# Patient Record
Sex: Male | Born: 1955 | Race: White | Hispanic: No | Marital: Married | Smoking: Former smoker
Health system: Southern US, Community
[De-identification: ages and names within clinical notes are randomized; demographics above are authoritative.]

## PROBLEM LIST (undated history)

## (undated) DIAGNOSIS — J449 Chronic obstructive pulmonary disease, unspecified: Secondary | ICD-10-CM

## (undated) DIAGNOSIS — C679 Malignant neoplasm of bladder, unspecified: Secondary | ICD-10-CM

## (undated) HISTORY — PX: RENAL ARTERY STENT: SHX2321

---

## 2016-09-16 ENCOUNTER — Emergency Department (HOSPITAL_BASED_OUTPATIENT_CLINIC_OR_DEPARTMENT_OTHER): Payer: Managed Care, Other (non HMO)

## 2016-09-16 ENCOUNTER — Encounter (HOSPITAL_BASED_OUTPATIENT_CLINIC_OR_DEPARTMENT_OTHER): Payer: Self-pay

## 2016-09-16 ENCOUNTER — Emergency Department (HOSPITAL_BASED_OUTPATIENT_CLINIC_OR_DEPARTMENT_OTHER)
Admission: EM | Admit: 2016-09-16 | Discharge: 2016-09-17 | Disposition: A | Discharge status: Home / Self Care | Payer: Managed Care, Other (non HMO) | Attending: Emergency Medicine | Admitting: Emergency Medicine

## 2016-09-16 DIAGNOSIS — T83091A Other mechanical complication of indwelling urethral catheter, initial encounter: Secondary | ICD-10-CM | POA: Insufficient documentation

## 2016-09-16 DIAGNOSIS — Y69 Unspecified misadventure during surgical and medical care: Secondary | ICD-10-CM | POA: Diagnosis not present

## 2016-09-16 DIAGNOSIS — J449 Chronic obstructive pulmonary disease, unspecified: Secondary | ICD-10-CM | POA: Insufficient documentation

## 2016-09-16 DIAGNOSIS — Z87891 Personal history of nicotine dependence: Secondary | ICD-10-CM | POA: Insufficient documentation

## 2016-09-16 HISTORY — DX: Malignant neoplasm of bladder, unspecified: C67.9

## 2016-09-16 HISTORY — DX: Chronic obstructive pulmonary disease, unspecified: J44.9

## 2016-09-16 MED ORDER — HYDROMORPHONE HCL 1 MG/ML IJ SOLN
1.0000 mg | Freq: Once | INTRAMUSCULAR | Status: AC
  Administered 2016-09-16: 1 mg via INTRAVENOUS
  Filled 2016-09-16: qty 1

## 2016-09-16 MED ORDER — ONDANSETRON HCL 4 MG/2ML IJ SOLN
4.0000 mg | Freq: Once | INTRAMUSCULAR | Status: AC
  Administered 2016-09-16: 4 mg via INTRAVENOUS
  Filled 2016-09-16: qty 2

## 2016-09-16 NOTE — ED Triage Notes (Signed)
Pt has foley catheter placed post bladder and kidney surgery for cancer, had stent placed on right kidney yesterday and about an hour ago, he started having severe right flank pain and his catheter stopped draining

## 2016-09-17 NOTE — ED Notes (Signed)
Foley cath is draining bloody output and patient stated that he felt better.

## 2016-09-17 NOTE — ED Provider Notes (Signed)
[facility] DEPT [facility] Provider Note: Georgena Spurling, MD, FACEP  CSN: [id number] MRN: [id number] ARRIVAL: 09/16/16 at [place]: [place]  Catheter blocked   HISTORY OF PRESENT ILLNESS  09/17/16 12:47 AM Corey Dixon is a 61 y.o. male who had cystoscopy for bladder cancer with resection of the tumor, right ureteral stent placement and kidney biopsy on the 21st. He has an indwelling Foley in place. About 9 PM yesterday evening his Foley catheter apparently became blocked. It was no longer draining urine and he developed severe pain in his right kidney. The pain was not relieved by the oxycodone he was prescribed. He was given Dilaudid in this emergency department with improvement in the pain and he is subsequently began draining urine again. His severe pain is relieved and he states he is ready to go without further diagnostic studies. He still has some mild pain in his right kidney which has been present since surgery. His urine is notably ordering due to taking Pyridium. He has an appointment later this morning to have his Foley catheter removed.   Past Medical History:  Diagnosis Date  . Bladder cancer ([place])   . COPD (chronic obstructive pulmonary disease) ([place])     Past Surgical History:  Procedure Laterality Date  . RENAL ARTERY STENT  2009, 09/15/2016    No family history on file.  Social History  Substance Use Topics  . Smoking status: Former Smoker    Types: E-cigarettes  . Smokeless tobacco: Never Used  . Alcohol use Yes    Prior to Admission medications   Not on File    Allergies Penicillins   REVIEW OF SYSTEMS  Negative except as noted here or in the History of Present Illness.   PHYSICAL EXAMINATION  Initial Vital Signs Blood pressure (!) 181/115, pulse 82, temperature 99.8 F (37.7 C), temperature source Oral, resp. rate 20, height 6' (1.829 m), weight 83.9 kg (185 lb), SpO2 94 %.  Examination General: Well-developed,  well-nourished male in no acute distress; appearance consistent with age of record HENT: normocephalic; atraumatic Eyes: pupils equal, round and reactive to light; extraocular muscles intact Neck: supple Heart: regular rate and rhythm Lungs: clear to auscultation bilaterally Abdomen: soft; nondistended; nontender; bowel sounds present GU: Mild right CVA tenderness; full catheter draining orange urine Extremities: No deformity; full range of motion; pulses normal Neurologic: Awake, alert and oriented; motor function intact in all extremities and symmetric; no facial droop Skin: Warm and dry Psychiatric: Normal mood and affect   RESULTS  Summary of this visit's results, reviewed by myself:   EKG Interpretation  Date/Time:    Ventricular Rate:    PR Interval:    QRS Duration:   QT Interval:    QTC Calculation:   R Axis:     Text Interpretation:        Laboratory Studies: No results found for this or any previous visit (from the past 24 hour(s)). Imaging Studies: No results found.  ED COURSE  Nursing notes and initial vitals signs, including pulse oximetry, reviewed.  Vitals:   09/16/16 2326  BP: (!) 181/115  Pulse: 82  Resp: 20  Temp: 99.8 F (37.7 C)  TempSrc: Oral  SpO2: 94%  Weight: 83.9 kg (185 lb)  Height: 6' (1.829 m)    PROCEDURES    ED DIAGNOSES     ICD-10-CM   1. Obstructed Foley catheter, initial encounter ([place]) T83.091A        Shanon Rosser, MD 09/17/16  0100  

## 2016-09-17 NOTE — ED Notes (Signed)
ED Provider at bedside. 

## 2017-07-18 ENCOUNTER — Encounter (HOSPITAL_BASED_OUTPATIENT_CLINIC_OR_DEPARTMENT_OTHER): Payer: Self-pay | Admitting: Emergency Medicine

## 2017-07-18 ENCOUNTER — Emergency Department (HOSPITAL_BASED_OUTPATIENT_CLINIC_OR_DEPARTMENT_OTHER): Payer: Managed Care, Other (non HMO)

## 2017-07-18 ENCOUNTER — Emergency Department (HOSPITAL_BASED_OUTPATIENT_CLINIC_OR_DEPARTMENT_OTHER)
Admission: EM | Admit: 2017-07-18 | Discharge: 2017-07-18 | Disposition: A | Discharge status: Home / Self Care | Payer: Managed Care, Other (non HMO) | Attending: Emergency Medicine | Admitting: Emergency Medicine

## 2017-07-18 ENCOUNTER — Other Ambulatory Visit: Payer: Self-pay

## 2017-07-18 DIAGNOSIS — Z8551 Personal history of malignant neoplasm of bladder: Secondary | ICD-10-CM | POA: Insufficient documentation

## 2017-07-18 DIAGNOSIS — Z87891 Personal history of nicotine dependence: Secondary | ICD-10-CM | POA: Insufficient documentation

## 2017-07-18 DIAGNOSIS — R05 Cough: Secondary | ICD-10-CM | POA: Diagnosis present

## 2017-07-18 DIAGNOSIS — J209 Acute bronchitis, unspecified: Secondary | ICD-10-CM

## 2017-07-18 DIAGNOSIS — J449 Chronic obstructive pulmonary disease, unspecified: Secondary | ICD-10-CM | POA: Insufficient documentation

## 2017-07-18 MED ORDER — PREDNISONE 20 MG PO TABS: ORAL_TABLET | ORAL | 0 refills | Status: DC

## 2017-07-18 MED ORDER — IPRATROPIUM-ALBUTEROL 0.5-2.5 (3) MG/3ML IN SOLN
3.0000 mL | RESPIRATORY_TRACT | Status: DC
  Administered 2017-07-18: 3 mL via RESPIRATORY_TRACT
  Filled 2017-07-18: qty 3

## 2017-07-18 MED ORDER — PREDNISONE 10 MG PO TABS
60.0000 mg | ORAL_TABLET | Freq: Once | ORAL | Status: AC
  Administered 2017-07-18: 22:00:00 60 mg via ORAL
  Filled 2017-07-18: qty 1

## 2017-07-18 NOTE — Progress Notes (Signed)
RT walked patient around the Department. Patients SP02 maintained at 94% to 95% on Room air. Patient states he's not short of breath when he walks.

## 2017-07-18 NOTE — ED Provider Notes (Signed)
Emergency Department Provider Note   I have reviewed the triage vital signs and the nursing notes.   HISTORY  Chief Complaint Cough   HPI Corey Dixon is a 62 y.o. male with a history of kidney cancer and COPD who presents the emergency department today with a cough and shortness of breath.  Patient states that he recently started chemotherapy last week and was camping in [place] over the weekend and noticed had a gradual onset of worsening shortness of breath.  Especially with exertion.  States he never really had anything like this before.  No chest pain or back pain.  No other associated symptoms.  No fevers.  Does have a cough but it is clear sputum.  No lower extremity swelling or leg pain. No other associated or modifying symptoms.    Past Medical History:  Diagnosis Date  . Bladder cancer ([place])   . COPD (chronic obstructive pulmonary disease) ([facility])     There are no active problems to display for this patient.   Past Surgical History:  Procedure Laterality Date  . RENAL ARTERY STENT  2009, 09/15/2016    Current Outpatient Rx  . Order #: [id number] Class: Historical Med  . Order #: [id number] Class: Historical Med  . Order #: [id number] Class: Historical Med  . Order #: [id number] Class: Historical Med  . Order #: [id number] Class: Historical Med  . Order #: [id number] Class: Historical Med  . Order #: [id number] Class: Historical Med  . Order #: [id number] Class: Historical Med  . Order #: [id number] Class: Historical Med  . Order #: [id number] Class: Print  . Order #: [id number] Class: Historical Med    Allergies Penicillins  History reviewed. No pertinent family history.  Social History Social History   Tobacco Use  . Smoking status: Former Smoker    Types: E-cigarettes  . Smokeless tobacco: Never Used  Substance Use Topics  . Alcohol use: Yes  . Drug use: Not on file    Review of Systems  All other systems negative except as documented in the HPI. All pertinent  positives and negatives as reviewed in the HPI. ____________________________________________   PHYSICAL EXAM:  VITAL SIGNS: ED Triage Vitals  Enc Vitals Group     BP 07/18/17 1940 136/85     Pulse Rate 07/18/17 1940 96     Resp 07/18/17 1940 20     Temp 07/18/17 1940 98 F (36.7 C)     Temp Source 07/18/17 1940 Oral     SpO2 07/18/17 1940 95 %     Weight 07/18/17 1936 185 lb (83.9 kg)     Height 07/18/17 1936 6' (1.829 m)    Constitutional: Alert and oriented. Well appearing and in no acute distress. Eyes: Conjunctivae are normal. PERRL. EOMI. Head: Atraumatic. Nose: No congestion/rhinnorhea. Mouth/Throat: Mucous membranes are moist.  Oropharynx non-erythematous. Neck: No stridor.  No meningeal signs.   Cardiovascular: Normal rate, regular rhythm. Good peripheral circulation. Grossly normal heart sounds.   Respiratory: Normal respiratory effort.  No retractions. Lungs diminished with mild wheezing. Gastrointestinal: Soft and nontender. No distention.  Musculoskeletal: No lower extremity tenderness nor edema. No gross deformities of extremities. Neurologic:  Normal speech and language. No gross focal neurologic deficits are appreciated.  Skin:  Skin is warm, dry and intact. No rash noted.   ____________________________________________   LABS (all labs ordered are listed, but only abnormal results are displayed)  Labs Reviewed - No data to display ____________________________________________  EKG   EKG Interpretation  Date/Time:  Sunday July 18 2017 19:44:02 EDT  Ventricular Rate:  94 PR Interval:    QRS Duration: 82 QT Interval:  340 QTC Calculation: 426 R Axis:   74 Text Interpretation:  Sinus rhythm Anteroseptal infarct, age indeterminate No old tracing to compare Confirmed by Merrily Pew [id number]) on 07/18/2017 8:37:21 PM      ____________________________________________  RADIOLOGY  Dg Chest 2 View  Result Date: 07/18/2017 CLINICAL DATA:  Shortness of  breath for the past 3 days. Ex-smoker. Being treated for kidney cancer. COPD. EXAM: CHEST - 2 VIEW COMPARISON:  None. FINDINGS: Normal sized heart. Hyperexpanded lungs. Increased density at the anterior, medial left lung base with obscuration of the left heart were, compatible with a mildly prominent epicardial fat pad. Mild peribronchial thickening. Small caliber right jugular porta catheter with its tip at the superior cavoatrial junction. Unremarkable bones. IMPRESSION: No acute abnormality.  Mild changes of COPD and chronic bronchitis. Electronically Signed   By: Claudie Revering M.D.   On: 07/18/2017 20:33    ____________________________________________   INITIAL IMPRESSION / ASSESSMENT AND PLAN / ED COURSE  Possibly bronchitis with a diminished breath sounds and history of the same.  Will give breathing treatment check x-ray.  However this does not help him entirely will need evaluation for cardiac and PE causes.  Patient with total resolution of symptoms after breathing treatment.  We will start him on steroids and have him do his inhaler at home as well.  Once again very very low suspicion for pulmonary embolus or cardiac cause however I did discuss with him that if his symptoms worsen or had any new symptoms with it he would need to return here for evaluation.  Otherwise will follow-up with his primary doctor.     Pertinent labs & imaging results that were available during my care of the patient were reviewed by me and considered in my medical decision making (see chart for details).  ____________________________________________  FINAL CLINICAL IMPRESSION(S) / ED DIAGNOSES  Final diagnoses:  Acute bronchitis, unspecified organism     MEDICATIONS GIVEN DURING THIS VISIT:  Medications  ipratropium-albuterol (DUONEB) 0.5-2.5 (3) MG/3ML nebulizer solution 3 mL (3 mLs Nebulization Given 07/18/17 2022)  predniSONE (DELTASONE) tablet 60 mg (60 mg Oral Given 07/18/17 2157)     NEW  OUTPATIENT MEDICATIONS STARTED DURING THIS VISIT:  Discharge Medication List as of 07/18/2017  9:51 PM    START taking these medications   Details  predniSONE (DELTASONE) 20 MG tablet 2 tabs po daily x 4 days, Print        Note:  This note was prepared with assistance of Dragon voice recognition software. Occasional wrong-word or sound-a-like substitutions may have occurred due to the inherent limitations of voice recognition software.   Merrily Pew, MD 07/18/17 2230

## 2017-07-18 NOTE — ED Triage Notes (Signed)
Patient states that he has been SOB for the last several days - [place]. The patient reports his last chemo treatment was on Tues.

## 2020-02-18 ENCOUNTER — Emergency Department (HOSPITAL_BASED_OUTPATIENT_CLINIC_OR_DEPARTMENT_OTHER): Payer: Medicare Other

## 2020-02-18 ENCOUNTER — Encounter (HOSPITAL_BASED_OUTPATIENT_CLINIC_OR_DEPARTMENT_OTHER): Payer: Self-pay | Admitting: Emergency Medicine

## 2020-02-18 ENCOUNTER — Other Ambulatory Visit: Payer: Self-pay

## 2020-02-18 ENCOUNTER — Inpatient Hospital Stay (HOSPITAL_BASED_OUTPATIENT_CLINIC_OR_DEPARTMENT_OTHER)
Admission: EM | Admit: 2020-02-18 | Discharge: 2020-02-25 | DRG: 982 | Disposition: A | Discharge status: Home / Self Care | Payer: Medicare Other | Attending: Internal Medicine | Admitting: Internal Medicine

## 2020-02-18 DIAGNOSIS — Z9889 Other specified postprocedural states: Secondary | ICD-10-CM

## 2020-02-18 DIAGNOSIS — K219 Gastro-esophageal reflux disease without esophagitis: Secondary | ICD-10-CM | POA: Diagnosis present

## 2020-02-18 DIAGNOSIS — I313 Pericardial effusion (noninflammatory): Secondary | ICD-10-CM | POA: Diagnosis present

## 2020-02-18 DIAGNOSIS — R5383 Other fatigue: Secondary | ICD-10-CM | POA: Diagnosis present

## 2020-02-18 DIAGNOSIS — Z87891 Personal history of nicotine dependence: Secondary | ICD-10-CM

## 2020-02-18 DIAGNOSIS — Z7952 Long term (current) use of systemic steroids: Secondary | ICD-10-CM

## 2020-02-18 DIAGNOSIS — Z23 Encounter for immunization: Secondary | ICD-10-CM

## 2020-02-18 DIAGNOSIS — R918 Other nonspecific abnormal finding of lung field: Secondary | ICD-10-CM

## 2020-02-18 DIAGNOSIS — Z20822 Contact with and (suspected) exposure to covid-19: Secondary | ICD-10-CM | POA: Diagnosis present

## 2020-02-18 DIAGNOSIS — C7802 Secondary malignant neoplasm of left lung: Secondary | ICD-10-CM | POA: Diagnosis present

## 2020-02-18 DIAGNOSIS — D529 Folate deficiency anemia, unspecified: Secondary | ICD-10-CM | POA: Diagnosis present

## 2020-02-18 DIAGNOSIS — R911 Solitary pulmonary nodule: Secondary | ICD-10-CM

## 2020-02-18 DIAGNOSIS — C3412 Malignant neoplasm of upper lobe, left bronchus or lung: Secondary | ICD-10-CM | POA: Diagnosis not present

## 2020-02-18 DIAGNOSIS — I160 Hypertensive urgency: Secondary | ICD-10-CM | POA: Diagnosis present

## 2020-02-18 DIAGNOSIS — C787 Secondary malignant neoplasm of liver and intrahepatic bile duct: Secondary | ICD-10-CM | POA: Diagnosis present

## 2020-02-18 DIAGNOSIS — Z8551 Personal history of malignant neoplasm of bladder: Secondary | ICD-10-CM

## 2020-02-18 DIAGNOSIS — Z85528 Personal history of other malignant neoplasm of kidney: Secondary | ICD-10-CM

## 2020-02-18 DIAGNOSIS — J449 Chronic obstructive pulmonary disease, unspecified: Secondary | ICD-10-CM

## 2020-02-18 DIAGNOSIS — N179 Acute kidney failure, unspecified: Secondary | ICD-10-CM | POA: Diagnosis present

## 2020-02-18 DIAGNOSIS — Z79899 Other long term (current) drug therapy: Secondary | ICD-10-CM

## 2020-02-18 DIAGNOSIS — K5730 Diverticulosis of large intestine without perforation or abscess without bleeding: Secondary | ICD-10-CM | POA: Diagnosis present

## 2020-02-18 DIAGNOSIS — J432 Centrilobular emphysema: Secondary | ICD-10-CM | POA: Diagnosis present

## 2020-02-18 DIAGNOSIS — Z9221 Personal history of antineoplastic chemotherapy: Secondary | ICD-10-CM

## 2020-02-18 DIAGNOSIS — U071 COVID-19: Secondary | ICD-10-CM

## 2020-02-18 DIAGNOSIS — J189 Pneumonia, unspecified organism: Secondary | ICD-10-CM

## 2020-02-18 DIAGNOSIS — U099 Post covid-19 condition, unspecified: Secondary | ICD-10-CM | POA: Diagnosis present

## 2020-02-18 DIAGNOSIS — Z7951 Long term (current) use of inhaled steroids: Secondary | ICD-10-CM

## 2020-02-18 DIAGNOSIS — J188 Other pneumonia, unspecified organism: Secondary | ICD-10-CM | POA: Diagnosis not present

## 2020-02-18 DIAGNOSIS — R590 Localized enlarged lymph nodes: Secondary | ICD-10-CM | POA: Diagnosis present

## 2020-02-18 DIAGNOSIS — I10 Essential (primary) hypertension: Secondary | ICD-10-CM | POA: Diagnosis present

## 2020-02-18 DIAGNOSIS — E871 Hypo-osmolality and hyponatremia: Secondary | ICD-10-CM | POA: Diagnosis not present

## 2020-02-18 DIAGNOSIS — E860 Dehydration: Secondary | ICD-10-CM | POA: Diagnosis present

## 2020-02-18 DIAGNOSIS — G4486 Cervicogenic headache: Secondary | ICD-10-CM | POA: Diagnosis present

## 2020-02-18 DIAGNOSIS — M79601 Pain in right arm: Secondary | ICD-10-CM | POA: Diagnosis present

## 2020-02-18 DIAGNOSIS — Z905 Acquired absence of kidney: Secondary | ICD-10-CM

## 2020-02-18 DIAGNOSIS — R0600 Dyspnea, unspecified: Secondary | ICD-10-CM

## 2020-02-18 DIAGNOSIS — J984 Other disorders of lung: Secondary | ICD-10-CM

## 2020-02-18 DIAGNOSIS — Z880 Allergy status to penicillin: Secondary | ICD-10-CM

## 2020-02-18 LAB — CBC WITH DIFFERENTIAL/PLATELET
Abs Immature Granulocytes: 0.05 10*3/uL (ref 0.00–0.07)
Basophils Absolute: 0 10*3/uL (ref 0.0–0.1)
Basophils Relative: 0 %
Eosinophils Absolute: 0.1 10*3/uL (ref 0.0–0.5)
Eosinophils Relative: 1 %
HCT: 35.4 % — ABNORMAL LOW (ref 39.0–52.0)
Hemoglobin: 12 g/dL — ABNORMAL LOW (ref 13.0–17.0)
Immature Granulocytes: 1 %
Lymphocytes Relative: 8 %
Lymphs Abs: 0.8 10*3/uL (ref 0.7–4.0)
MCH: 28.5 pg (ref 26.0–34.0)
MCHC: 33.9 g/dL (ref 30.0–36.0)
MCV: 84.1 fL (ref 80.0–100.0)
Monocytes Absolute: 0.8 10*3/uL (ref 0.1–1.0)
Monocytes Relative: 8 %
Neutro Abs: 7.8 10*3/uL — ABNORMAL HIGH (ref 1.7–7.7)
Neutrophils Relative %: 82 %
Platelets: 304 10*3/uL (ref 150–400)
RBC: 4.21 MIL/uL — ABNORMAL LOW (ref 4.22–5.81)
RDW: 12.3 % (ref 11.5–15.5)
WBC: 9.5 10*3/uL (ref 4.0–10.5)
nRBC: 0 % (ref 0.0–0.2)

## 2020-02-18 LAB — COMPREHENSIVE METABOLIC PANEL
ALT: 20 U/L (ref 0–44)
AST: 17 U/L (ref 15–41)
Albumin: 3.6 g/dL (ref 3.5–5.0)
Alkaline Phosphatase: 78 U/L (ref 38–126)
Anion gap: 11 (ref 5–15)
BUN: 20 mg/dL (ref 8–23)
CO2: 23 mmol/L (ref 22–32)
Calcium: 8.9 mg/dL (ref 8.9–10.3)
Chloride: 102 mmol/L (ref 98–111)
Creatinine, Ser: 2.02 mg/dL — ABNORMAL HIGH (ref 0.61–1.24)
GFR, Estimated: 36 mL/min — ABNORMAL LOW (ref >60)
Glucose, Bld: 125 mg/dL — ABNORMAL HIGH (ref 70–99)
Potassium: 4.1 mmol/L (ref 3.5–5.1)
Sodium: 136 mmol/L (ref 135–145)
Total Bilirubin: 0.2 mg/dL — ABNORMAL LOW (ref 0.3–1.2)
Total Protein: 7.2 g/dL (ref 6.5–8.1)

## 2020-02-18 LAB — LACTIC ACID, PLASMA: Lactic Acid, Venous: 1.2 mmol/L (ref 0.5–1.9)

## 2020-02-18 LAB — TROPONIN I (HIGH SENSITIVITY): Troponin I (High Sensitivity): 3 ng/L (ref <18)

## 2020-02-18 MED ORDER — ACETAMINOPHEN 325 MG PO TABS
650.0000 mg | ORAL_TABLET | Freq: Once | ORAL | Status: AC
  Administered 2020-02-18: 650 mg via ORAL
  Filled 2020-02-18: qty 2

## 2020-02-18 MED ORDER — SODIUM CHLORIDE 0.9 % IV SOLN
1.0000 g | Freq: Once | INTRAVENOUS | Status: DC
  Filled 2020-02-18: qty 1

## 2020-02-18 MED ORDER — VANCOMYCIN HCL IN DEXTROSE 1-5 GM/200ML-% IV SOLN
1000.0000 mg | Freq: Once | INTRAVENOUS | Status: AC
  Administered 2020-02-19: 1000 mg via INTRAVENOUS
  Filled 2020-02-18: qty 200

## 2020-02-18 MED ORDER — SODIUM CHLORIDE 0.9 % IV SOLN
2.0000 g | Freq: Once | INTRAVENOUS | Status: AC
  Administered 2020-02-18: 2 g via INTRAVENOUS
  Filled 2020-02-18: qty 2

## 2020-02-18 MED ORDER — SODIUM CHLORIDE 0.9 % IV SOLN: INTRAVENOUS | Status: DC | PRN

## 2020-02-18 MED ORDER — SODIUM CHLORIDE 0.9 % IV BOLUS
1000.0000 mL | Freq: Once | INTRAVENOUS | Status: AC
  Administered 2020-02-18: 1000 mL via INTRAVENOUS

## 2020-02-18 MED ORDER — METHYLPREDNISOLONE SODIUM SUCC 125 MG IJ SOLR
125.0000 mg | Freq: Once | INTRAMUSCULAR | Status: AC
  Administered 2020-02-18: 125 mg via INTRAVENOUS
  Filled 2020-02-18: qty 2

## 2020-02-18 MED ORDER — ALBUTEROL SULFATE HFA 108 (90 BASE) MCG/ACT IN AERS
2.0000 | INHALATION_SPRAY | Freq: Once | RESPIRATORY_TRACT | Status: AC
  Administered 2020-02-18: 2 via RESPIRATORY_TRACT
  Filled 2020-02-18: qty 6.7

## 2020-02-18 NOTE — ED Provider Notes (Signed)
[facility] EMERGENCY DEPARTMENT Provider Note   CSN: [id number] Arrival date & time: 02/18/20  1955     History Chief Complaint  Patient presents with  . Chest Pain  . Shortness of Breath    Corey Dixon is a 65 y.o. male hx of COPD, bladder cancer s/p BCG here with cough, poor appetite. He was diagnosed with COVID on 1/10. He has been having shortness of breath since then. He had some chills that resolved. He has persistent cough and poor appetite. Patient states that he should be feeling better but felt worse so came for evaluation. Of note, patient has no recent travel or history of blood clots. He had previous lung mass that was stable as of 2019   The history is provided by the patient.       Past Medical History:  Diagnosis Date  . Bladder cancer ([place])   . COPD (chronic obstructive pulmonary disease) ([facility])     There are no problems to display for this patient.   Past Surgical History:  Procedure Laterality Date  . RENAL ARTERY STENT  2009, 09/15/2016       History reviewed. No pertinent family history.  Social History   Tobacco Use  . Smoking status: Former Smoker    Types: E-cigarettes  . Smokeless tobacco: Never Used  Substance Use Topics  . Alcohol use: Yes    Home Medications Prior to Admission medications   Medication Sig Start Date End Date Taking? Authorizing Provider  albuterol (PROAIR HFA) 108 (90 Base) MCG/ACT inhaler Inhale into the lungs. 11/11/15   [provider]  Brexpiprazole (REXULTI) 0.5 MG TABS every morning. 04/30/17   [provider]  Fluticasone-Salmeterol (ADVAIR DISKUS) 100-50 MCG/DOSE AEPB INHALE 1 PUFF INTO THE LUNGS TWICE DAILY 03/01/15   [provider]  omeprazole (PRILOSEC OTC) 20 MG tablet Take by mouth.    [provider]  oxybutynin (DITROPAN) 5 MG tablet Take by mouth. 01/08/17   [provider]  oxyCODONE (ROXICODONE) 5 MG immediate release tablet Take by mouth.  01/08/17   [provider]  PARoxetine (PAXIL) 30 MG tablet Take by mouth. 04/30/17   [provider]  pravastatin (PRAVACHOL) 10 MG tablet Take by mouth. 08/27/15   [provider]  predniSONE (DELTASONE) 20 MG tablet 2 tabs po daily x 4 days 07/18/17   Mesner, Corene Cornea, MD  REXULTI 0.5 MG TABS TK 1 T PO D 07/03/17   [provider]  tamsulosin (FLOMAX) 0.4 MG CAPS capsule TAKE 1 CAPSULE(0.4 MG) BY MOUTH DAILY 07/10/15   [provider]    Allergies    Penicillins  Review of Systems   Review of Systems  Respiratory: Positive for shortness of breath.   Cardiovascular: Positive for chest pain.  All other systems reviewed and are negative.   Physical Exam Updated Vital Signs BP (!) 157/89 (BP Location: Left Arm)   Pulse 87   Temp 98.3 F (36.8 C) (Oral)   Resp 20   Ht 6' (1.829 m)   Wt 83.9 kg   SpO2 96%   BMI 25.09 kg/m   Physical Exam Vitals and nursing note reviewed.  Constitutional:      Comments: Chronically ill   HENT:     Head: Normocephalic.  Eyes:     Extraocular Movements: Extraocular movements intact.     Pupils: Pupils are equal, round, and reactive to light.  Cardiovascular:     Rate and Rhythm: Normal rate and regular  rhythm.     Heart sounds: Normal heart sounds.  Pulmonary:     Comments: Wheezing R side, diminished L side  Abdominal:     General: Bowel sounds are normal.     Palpations: Abdomen is soft.  Musculoskeletal:        General: Normal range of motion.     Cervical back: Normal range of motion and neck supple.  Skin:    General: Skin is warm.     Capillary Refill: Capillary refill takes less than 2 seconds.  Neurological:     General: No focal deficit present.     Mental Status: He is alert and oriented to person, place, and time.  Psychiatric:        Mood and Affect: Mood normal.        Behavior: Behavior normal.     ED Results / Procedures / Treatments   Labs (all labs ordered are listed, but  only abnormal results are displayed) Labs Reviewed  CBC WITH DIFFERENTIAL/PLATELET - Abnormal; Notable for the following components:      Result Value   RBC 4.21 (*)    Hemoglobin 12.0 (*)    HCT 35.4 (*)    Neutro Abs 7.8 (*)    All other components within normal limits  COMPREHENSIVE METABOLIC PANEL - Abnormal; Notable for the following components:   Glucose, Bld 125 (*)    Creatinine, Ser 2.02 (*)    Total Bilirubin 0.2 (*)    GFR, Estimated 36 (*)    All other components within normal limits  CULTURE, BLOOD (ROUTINE X 2)  CULTURE, BLOOD (ROUTINE X 2)  LACTIC ACID, PLASMA  LACTIC ACID, PLASMA  TROPONIN I (HIGH SENSITIVITY)    EKG EKG Interpretation  Date/Time:  Sunday February 18 2020 20:48:39 EST Ventricular Rate:  92 PR Interval:    QRS Duration: 88 QT Interval:  336 QTC Calculation: 416 R Axis:   81 Text Interpretation: Sinus rhythm Anteroseptal infarct, age indeterminate No significant change since last tracing Confirmed by Wandra Arthurs [phone]) on 02/18/2020 8:55:17 PM   Radiology CT CHEST WO CONTRAST  Result Date: 02/18/2020 CLINICAL DATA:  Chest pain, dyspnea, COVID positive EXAM: CT CHEST WITHOUT CONTRAST TECHNIQUE: Multidetector CT imaging of the chest was performed following the standard protocol without IV contrast. COMPARISON:  None. FINDINGS: Cardiovascular: Mild coronary artery calcification. Global cardiac size within normal limits. Trace pericardial effusion. The central pulmonary arteries are of normal caliber. Mild atherosclerotic calcification within the thoracic aorta. No aortic aneurysm. Mediastinum/Nodes: Pathologic right paratracheal and aortopulmonary lymphadenopathy is present with the index lymph node seen within the right paratracheal region measuring 2.0 x 3.1 cm at axial image # 57. The esophagus is unremarkable. Thyroid unremarkable. Lungs/Pleura: Severe, apically predominant centrilobular emphysema. The irregular opacity noted within the left apex  on prior chest radiograph corresponds to a oblong cavitary mass measuring 2.1 x 2.1 x 3.7 cm in greatest dimension on axial image # 30 and coronal image # 64. The lesion demonstrates a a mildly irregular soft tissue rind and a cavitary neoplasm is difficult to exclude. Alternatively, this may represent the residua of a cavitary infectious process. No superimposed focal pulmonary infiltrate is identified. There are scattered throughout the lungs in a random distribution demonstrating a lower lung zone predominance at least 30 pulmonary nodules measuring up to 15 mm within the left upper lobe at axial image # 61. These are suspicious for pulmonary metastases in these may represent metastatic disease from an unknown primary  versus is intra pulmonary metastases secondary to a primary pulmonary malignancy. No pneumothorax or pleural effusion. The central airways are widely patent. Upper Abdomen: A surgical staple line is seen inferior to the right adrenal gland which may reflect surgical changes of a right nephrectomy, though this is not definitively identified on this examination. No acute abnormality within the upper abdomen. Musculoskeletal: No lytic or blastic bone lesions are identified. IMPRESSION: Left upper lobe opacity corresponds to a cavitary mass within the left upper lobe possibly representing a post inflammatory cavity or, less likely, a cavitary neoplasm. Innumerable pulmonary nodules distributed randomly with a lower lobe predominance most in keeping with pulmonary metastatic disease. Pathologic mediastinal adenopathy also identified. Severe centrilobular emphysema. Aortic Atherosclerosis (ICD10-I70.0). Electronically Signed   By: Fidela Salisbury MD   On: 02/18/2020 22:42   DG Chest Port 1 View  Result Date: 02/18/2020 CLINICAL DATA:  Chest pain and shortness of breath since positive COVID test from 2 weeks ago. EXAM: PORTABLE CHEST 1 VIEW COMPARISON:  07/18/2017 FINDINGS: Heart size and pulmonary  vascularity are normal. Emphysematous changes in the lungs. Scattered fibrosis. There is suggestion of a developing cavitary lesion in the left apex measuring 2.4 cm diameter. CT suggested for further evaluation. No pleural effusions. No pneumothorax. Mediastinal contours appear intact. IMPRESSION: 1. Suggestion of developing cavitary lesion in the left apex. CT suggested for further evaluation. 2. Emphysematous changes and fibrosis in the lungs. Electronically Signed   By: Lucienne Capers M.D.   On: 02/18/2020 21:15    Procedures Procedures (including critical care time)  CRITICAL CARE Performed by: Wandra Arthurs   Total critical care time: 30 minutes  Critical care time was exclusive of separately billable procedures and treating other patients.  Critical care was necessary to treat or prevent imminent or life-threatening deterioration.  Critical care was time spent personally by me on the following activities: development of treatment plan with patient and/or surrogate as well as nursing, discussions with consultants, evaluation of patient's response to treatment, examination of patient, obtaining history from patient or surrogate, ordering and performing treatments and interventions, ordering and review of laboratory studies, ordering and review of radiographic studies, pulse oximetry and re-evaluation of patient's condition.   Medications Ordered in ED Medications  vancomycin (VANCOCIN) IVPB 1000 mg/200 mL premix (has no administration in time range)  ceFEPIme (MAXIPIME) 2 g in sodium chloride 0.9 % 100 mL IVPB (has no administration in time range)  sodium chloride 0.9 % bolus 1,000 mL (0 mLs Intravenous Stopped 02/18/20 2235)  methylPREDNISolone sodium succinate (SOLU-MEDROL) 125 mg/2 mL injection 125 mg (125 mg Intravenous Given 02/18/20 2114)  albuterol (VENTOLIN HFA) 108 (90 Base) MCG/ACT inhaler 2 puff (2 puffs Inhalation Given 02/18/20 2122)    ED Course  I have reviewed the  triage vital signs and the nursing notes.  Pertinent labs & imaging results that were available during my care of the patient were reviewed by me and considered in my medical decision making (see chart for details).    MDM Rules/Calculators/A&P                         Corey Dixon is a 65 y.o. male recent COVID 2 weeks ago here with persistent cough, SOB. Has wheezing on exam and hx of COPD so consider COPD exacerbation vs pneumonia. Will get cbc, cmp, CXR.   11:18 PM CXR showed l upper lobe cavitary lesion. CBC unremarkable. CMP showed Cr 2.0, which is  baseline. CT chest showed left upper lobe cavitary mass and possible metastatic disease. He has no known TB risk factors and had multiple negative TB test in the past since he had to get BCG for his bladder cancer. Talked to Dr. Tommy Medal from [facility]. He states that it could be MRSA vs bled vs post inflammatory cavity. He may need AFB or bronchoscopy for further evaluation. Will give broad spectrum abx for pneumonia and will admit for further evaluation and r/o TB vs mass.    Final Clinical Impression(s) / ED Diagnoses Final diagnoses:  None    Rx / DC Orders ED Discharge Orders    None       Drenda Freeze, MD 02/18/20 2320

## 2020-02-18 NOTE — ED Triage Notes (Signed)
Patient presents with complaints of chest pain and shortness of breath ongoing since covid positive. States positive covid test from [facility] 2 weeks ago.

## 2020-02-18 NOTE — ED Notes (Signed)
Pt to CT

## 2020-02-19 MED ORDER — ACETAMINOPHEN 325 MG PO TABS
650.0000 mg | ORAL_TABLET | Freq: Four times a day (QID) | ORAL | Status: DC | PRN
  Administered 2020-02-19 – 2020-02-22 (×5): 650 mg via ORAL
  Filled 2020-02-19 (×4): qty 2

## 2020-02-19 MED ORDER — SODIUM CHLORIDE 0.9 % IV SOLN
2.0000 g | Freq: Two times a day (BID) | INTRAVENOUS | Status: DC
  Administered 2020-02-19 – 2020-02-20 (×3): 2 g via INTRAVENOUS
  Filled 2020-02-19 (×3): qty 2

## 2020-02-19 MED ORDER — VANCOMYCIN HCL IN DEXTROSE 1-5 GM/200ML-% IV SOLN
1000.0000 mg | INTRAVENOUS | Status: DC
  Administered 2020-02-19 – 2020-02-20 (×2): 1000 mg via INTRAVENOUS
  Filled 2020-02-19 (×2): qty 200

## 2020-02-19 NOTE — ED Notes (Signed)
[facility] at Bedside; Report Given to same.

## 2020-02-19 NOTE — Progress Notes (Signed)
Pharmacy Antibiotic Note  Corey Dixon is a 65 y.o. male admitted on 02/18/2020 with pneumonia.  Pharmacy has been consulted for vancomycin and cefepime dosing. Pt is afebrile and WBC is WNL. Scr is elevated.   Plan: Vancomycin 1gm IV Q24H Cefepime 2gm IV Q12H F/u renal fxn, C&S, clinical status and peak/trough at SS  Height: 6' (182.9 cm) Weight: 83.9 kg (184 lb 15.5 oz) IBW/kg (Calculated) : 77.6  Temp (24hrs), Avg:98.3 F (36.8 C), Min:98.3 F (36.8 C), Max:98.3 F (36.8 C)  Recent Labs  Lab 02/18/20 2125 02/18/20 2234  WBC 9.5  --   CREATININE 2.02*  --   LATICACIDVEN  --  1.2    Estimated Creatinine Clearance: 39.5 mL/min (A) (by C-G formula based on SCr of 2.02 mg/dL (H)).    Allergies  Allergen Reactions  . Penicillins     unknown    Antimicrobials this admission: Vanc 1/24>> Cefepime 1/24>>  Dose adjustments this admission: N/A  Microbiology results: Pending  Thank you for allowing pharmacy to be a part of this patient's care.  Kimara Bencomo, Rande Lawman 02/19/2020 1:21 PM

## 2020-02-19 NOTE — ED Notes (Signed)
Care Handoff/Report given to [facility], 89M RN at this time. All questions answered.

## 2020-02-20 DIAGNOSIS — J1282 Pneumonia due to coronavirus disease 2019: Secondary | ICD-10-CM | POA: Diagnosis not present

## 2020-02-20 DIAGNOSIS — I160 Hypertensive urgency: Secondary | ICD-10-CM | POA: Diagnosis present

## 2020-02-20 DIAGNOSIS — U099 Post covid-19 condition, unspecified: Secondary | ICD-10-CM | POA: Diagnosis present

## 2020-02-20 DIAGNOSIS — E860 Dehydration: Secondary | ICD-10-CM | POA: Diagnosis present

## 2020-02-20 DIAGNOSIS — D529 Folate deficiency anemia, unspecified: Secondary | ICD-10-CM | POA: Diagnosis present

## 2020-02-20 DIAGNOSIS — Z8551 Personal history of malignant neoplasm of bladder: Secondary | ICD-10-CM | POA: Diagnosis not present

## 2020-02-20 DIAGNOSIS — Z87891 Personal history of nicotine dependence: Secondary | ICD-10-CM

## 2020-02-20 DIAGNOSIS — C787 Secondary malignant neoplasm of liver and intrahepatic bile duct: Secondary | ICD-10-CM | POA: Diagnosis present

## 2020-02-20 DIAGNOSIS — M79601 Pain in right arm: Secondary | ICD-10-CM | POA: Diagnosis present

## 2020-02-20 DIAGNOSIS — J449 Chronic obstructive pulmonary disease, unspecified: Secondary | ICD-10-CM | POA: Diagnosis not present

## 2020-02-20 DIAGNOSIS — Z9889 Other specified postprocedural states: Secondary | ICD-10-CM | POA: Diagnosis not present

## 2020-02-20 DIAGNOSIS — K219 Gastro-esophageal reflux disease without esophagitis: Secondary | ICD-10-CM | POA: Diagnosis present

## 2020-02-20 DIAGNOSIS — J189 Pneumonia, unspecified organism: Secondary | ICD-10-CM | POA: Diagnosis not present

## 2020-02-20 DIAGNOSIS — Z7951 Long term (current) use of inhaled steroids: Secondary | ICD-10-CM | POA: Diagnosis not present

## 2020-02-20 DIAGNOSIS — C3412 Malignant neoplasm of upper lobe, left bronchus or lung: Secondary | ICD-10-CM | POA: Diagnosis present

## 2020-02-20 DIAGNOSIS — N179 Acute kidney failure, unspecified: Secondary | ICD-10-CM | POA: Diagnosis present

## 2020-02-20 DIAGNOSIS — J432 Centrilobular emphysema: Secondary | ICD-10-CM | POA: Diagnosis present

## 2020-02-20 DIAGNOSIS — J188 Other pneumonia, unspecified organism: Secondary | ICD-10-CM | POA: Diagnosis present

## 2020-02-20 DIAGNOSIS — R918 Other nonspecific abnormal finding of lung field: Secondary | ICD-10-CM | POA: Diagnosis not present

## 2020-02-20 DIAGNOSIS — U071 COVID-19: Secondary | ICD-10-CM

## 2020-02-20 DIAGNOSIS — I313 Pericardial effusion (noninflammatory): Secondary | ICD-10-CM | POA: Diagnosis present

## 2020-02-20 DIAGNOSIS — E871 Hypo-osmolality and hyponatremia: Secondary | ICD-10-CM | POA: Diagnosis not present

## 2020-02-20 DIAGNOSIS — K5730 Diverticulosis of large intestine without perforation or abscess without bleeding: Secondary | ICD-10-CM | POA: Diagnosis present

## 2020-02-20 DIAGNOSIS — R5383 Other fatigue: Secondary | ICD-10-CM | POA: Diagnosis present

## 2020-02-20 DIAGNOSIS — R911 Solitary pulmonary nodule: Secondary | ICD-10-CM

## 2020-02-20 DIAGNOSIS — I300 Acute nonspecific idiopathic pericarditis: Secondary | ICD-10-CM | POA: Diagnosis not present

## 2020-02-20 DIAGNOSIS — Z23 Encounter for immunization: Secondary | ICD-10-CM | POA: Diagnosis present

## 2020-02-20 DIAGNOSIS — Z20822 Contact with and (suspected) exposure to covid-19: Secondary | ICD-10-CM | POA: Diagnosis present

## 2020-02-20 DIAGNOSIS — J984 Other disorders of lung: Secondary | ICD-10-CM

## 2020-02-20 DIAGNOSIS — Z7952 Long term (current) use of systemic steroids: Secondary | ICD-10-CM | POA: Diagnosis not present

## 2020-02-20 DIAGNOSIS — G4486 Cervicogenic headache: Secondary | ICD-10-CM | POA: Diagnosis present

## 2020-02-20 DIAGNOSIS — C7802 Secondary malignant neoplasm of left lung: Secondary | ICD-10-CM | POA: Diagnosis present

## 2020-02-20 DIAGNOSIS — R590 Localized enlarged lymph nodes: Secondary | ICD-10-CM | POA: Diagnosis present

## 2020-02-20 DIAGNOSIS — I10 Essential (primary) hypertension: Secondary | ICD-10-CM | POA: Diagnosis present

## 2020-02-20 DIAGNOSIS — R519 Headache, unspecified: Secondary | ICD-10-CM | POA: Diagnosis not present

## 2020-02-20 LAB — CRYPTOCOCCAL ANTIGEN: Crypto Ag: NEGATIVE

## 2020-02-20 LAB — COMPREHENSIVE METABOLIC PANEL
ALT: 25 U/L (ref 0–44)
AST: 19 U/L (ref 15–41)
Albumin: 3.2 g/dL — ABNORMAL LOW (ref 3.5–5.0)
Alkaline Phosphatase: 75 U/L (ref 38–126)
Anion gap: 12 (ref 5–15)
BUN: 26 mg/dL — ABNORMAL HIGH (ref 8–23)
CO2: 20 mmol/L — ABNORMAL LOW (ref 22–32)
Calcium: 9 mg/dL (ref 8.9–10.3)
Chloride: 103 mmol/L (ref 98–111)
Creatinine, Ser: 1.46 mg/dL — ABNORMAL HIGH (ref 0.61–1.24)
GFR, Estimated: 53 mL/min — ABNORMAL LOW (ref >60)
Glucose, Bld: 165 mg/dL — ABNORMAL HIGH (ref 70–99)
Potassium: 4.4 mmol/L (ref 3.5–5.1)
Sodium: 135 mmol/L (ref 135–145)
Total Bilirubin: 0.3 mg/dL (ref 0.3–1.2)
Total Protein: 6.6 g/dL (ref 6.5–8.1)

## 2020-02-20 LAB — CBC
HCT: 33.2 % — ABNORMAL LOW (ref 39.0–52.0)
Hemoglobin: 11.5 g/dL — ABNORMAL LOW (ref 13.0–17.0)
MCH: 28.7 pg (ref 26.0–34.0)
MCHC: 34.6 g/dL (ref 30.0–36.0)
MCV: 82.8 fL (ref 80.0–100.0)
Platelets: 298 10*3/uL (ref 150–400)
RBC: 4.01 MIL/uL — ABNORMAL LOW (ref 4.22–5.81)
RDW: 12.3 % (ref 11.5–15.5)
WBC: 13.4 10*3/uL — ABNORMAL HIGH (ref 4.0–10.5)
nRBC: 0 % (ref 0.0–0.2)

## 2020-02-20 LAB — MRSA PCR SCREENING: MRSA by PCR: NEGATIVE

## 2020-02-20 LAB — PROTIME-INR
INR: 1.1 (ref 0.8–1.2)
Prothrombin Time: 14.2 seconds (ref 11.4–15.2)

## 2020-02-20 LAB — MAGNESIUM: Magnesium: 1.5 mg/dL — ABNORMAL LOW (ref 1.7–2.4)

## 2020-02-20 LAB — PHOSPHORUS: Phosphorus: 3.3 mg/dL (ref 2.5–4.6)

## 2020-02-20 LAB — APTT: aPTT: 28 seconds (ref 24–36)

## 2020-02-20 LAB — CULTURE, BLOOD (ROUTINE X 2): Special Requests: ADEQUATE

## 2020-02-20 LAB — SARS CORONAVIRUS 2 (TAT 6-24 HRS): SARS Coronavirus 2: NEGATIVE

## 2020-02-20 LAB — HIV ANTIBODY (ROUTINE TESTING W REFLEX): HIV Screen 4th Generation wRfx: NONREACTIVE

## 2020-02-20 LAB — PROCALCITONIN: Procalcitonin: <0.1 ng/mL

## 2020-02-20 MED ORDER — IPRATROPIUM-ALBUTEROL 0.5-2.5 (3) MG/3ML IN SOLN: 3.0000 mL | RESPIRATORY_TRACT | Status: DC | PRN

## 2020-02-20 MED ORDER — BISACODYL 5 MG PO TBEC: 5.0000 mg | DELAYED_RELEASE_TABLET | Freq: Every day | ORAL | Status: DC | PRN

## 2020-02-20 MED ORDER — HEPARIN SODIUM (PORCINE) 5000 UNIT/ML IJ SOLN: 5000.0000 [IU] | Freq: Three times a day (TID) | INTRAMUSCULAR | Status: DC

## 2020-02-20 MED ORDER — GUAIFENESIN-DM 100-10 MG/5ML PO SYRP: 10.0000 mL | ORAL_SOLUTION | ORAL | Status: DC | PRN

## 2020-02-20 MED ORDER — HYDROCOD POLST-CPM POLST ER 10-8 MG/5ML PO SUER: 5.0000 mL | Freq: Two times a day (BID) | ORAL | Status: DC | PRN

## 2020-02-20 MED ORDER — ENOXAPARIN SODIUM 40 MG/0.4ML ~~LOC~~ SOLN
40.0000 mg | SUBCUTANEOUS | Status: DC
  Administered 2020-02-20: 40 mg via SUBCUTANEOUS
  Filled 2020-02-20 (×2): qty 0.4

## 2020-02-20 MED ORDER — ONDANSETRON HCL 4 MG PO TABS: 4.0000 mg | ORAL_TABLET | Freq: Four times a day (QID) | ORAL | Status: DC | PRN

## 2020-02-20 MED ORDER — MAGNESIUM SULFATE 2 GM/50ML IV SOLN
2.0000 g | Freq: Once | INTRAVENOUS | Status: AC
  Administered 2020-02-20: 2 g via INTRAVENOUS
  Filled 2020-02-20 (×2): qty 50

## 2020-02-20 MED ORDER — ONDANSETRON HCL 4 MG/2ML IJ SOLN: 4.0000 mg | Freq: Four times a day (QID) | INTRAMUSCULAR | Status: DC | PRN

## 2020-02-20 MED ORDER — ZINC SULFATE 220 (50 ZN) MG PO CAPS
220.0000 mg | ORAL_CAPSULE | Freq: Every day | ORAL | Status: DC
  Administered 2020-02-20 – 2020-02-25 (×5): 220 mg via ORAL
  Filled 2020-02-20 (×5): qty 1

## 2020-02-20 MED ORDER — METHYLPREDNISOLONE SODIUM SUCC 40 MG IJ SOLR
40.0000 mg | Freq: Every day | INTRAMUSCULAR | Status: DC
  Administered 2020-02-20 – 2020-02-22 (×3): 40 mg via INTRAVENOUS
  Filled 2020-02-20 (×3): qty 1

## 2020-02-20 MED ORDER — MELATONIN 5 MG PO TABS
5.0000 mg | ORAL_TABLET | Freq: Every day | ORAL | Status: DC
  Administered 2020-02-20 – 2020-02-24 (×5): 5 mg via ORAL
  Filled 2020-02-20 (×6): qty 1

## 2020-02-20 MED ORDER — ASCORBIC ACID 500 MG PO TABS
500.0000 mg | ORAL_TABLET | Freq: Every day | ORAL | Status: DC
  Administered 2020-02-20 – 2020-02-25 (×5): 500 mg via ORAL
  Filled 2020-02-20 (×6): qty 1

## 2020-02-20 MED ORDER — INFLUENZA VAC A&B SA ADJ QUAD 0.5 ML IM PRSY
0.5000 mL | PREFILLED_SYRINGE | INTRAMUSCULAR | Status: AC
  Administered 2020-02-21: 0.5 mL via INTRAMUSCULAR
  Filled 2020-02-20: qty 0.5

## 2020-02-20 NOTE — Consult Note (Signed)
NAME:  Corey Dixon, MRN:  [id number], DOB:  1955-05-05, LOS: 0 ADMISSION DATE:  02/18/2020, CONSULTATION DATE:  02/20/20 REFERRING MD:  Tyrell Antonio  CHIEF COMPLAINT:  Cavitary Lesion, Pulmonary Nodules   Brief History   Corey Dixon is a 65 y.o. male who was admitted 1/25 with dyspnea 2/2 COVID PNA and CT chest demonstrated LUL cavitation for which PCCM was asked to weigh in on.  History of present illness   Corey Dixon is a 65 y.o. male who has a PMH including but not limited to COPD, GERD, bladder CA s/p chemo and right nephrectomy (see "past medical history" for rest).  He presented to [facility] ED 1/23 with dyspnea and increased work of breathing.  He was diagnosed with COVID 1/10 .  He reports excessive fatigue than dyspnea, no wheezing cough has been very mild and has not needed much cough syrup.  He had monitored O2 sats at home and lowest that they got were 96%.  Due to generalized weakness and fatigue, he decided to seek medical attention; hence, he presented to [facility] ED. He reports a 10 pound weight loss in the last week due to vomiting which he thinks may have been related to medication that his PCP gave him  In ED, CXR suggested LUL cavitation; therefore, CT chest was obtained.  This confirmed LUL cavitary lesion along with multiple pulmonary nodules bilaterally.  Per pt, he has has multiple negative TB tests in the past (had to test prior to getting BCG for bladder CA, last test 2019 per report). He is a former smoker. Per Care Everywhere, he has a 46 pack year history and quit in 2017.  Past Medical History  has Cavitary pneumonia; COVID-19 virus infection; AKI (acute kidney injury) ([place]); COPD (chronic obstructive pulmonary disease) ([place]); History of bladder cancer; and Pulmonary nodules/lesions, multiple on their problem list.  Significant Hospital Events   1/25 > admit.  Consults:  ID, PCCM.  Procedures:  None.  Significant Diagnostic Tests:  CT chest 1/23 > LUL cavitary  mass.  Innumerable pulmonary nodules with lower lobe predominance, mediastinal adenopathy, severe centrilobular emphysema.  Micro Data:  Blood 1/23 >  AFB 1/25 >  COVID 1/25 > neg COVID1/10 (per report) > positive.  Antimicrobials:  Vanc 1/23 > 1/25 Cefepime 1/23 > 1/25    Interim history/subjective:    Objective:  Blood pressure (!) 171/85, pulse 77, temperature 98 F (36.7 C), temperature source Oral, resp. rate 18, height 6' (1.829 m), weight 81.9 kg, SpO2 97 %.        Intake/Output Summary (Last 24 hours) at 02/20/2020 1558 Last data filed at 02/20/2020 0900 Gross per 24 hour  Intake 693.49 ml  Output 0 ml  Net 693.49 ml   Filed Weights   02/18/20 2027 02/20/20 0025  Weight: 83.9 kg 81.9 kg    Examination: Gen. Pleasant, well-nourished, in no distress, normal affect ENT - no pallor,icterus, no post nasal drip Neck: No JVD, no thyromegaly, no carotid bruits Lungs: no use of accessory muscles, no dullness to percussion, clear without rales or rhonchi  Cardiovascular: Rhythm regular, heart sounds  normal, no murmurs or gallops, no peripheral edema Abdomen: soft and non-tender, no hepatosplenomegaly, BS normal. Musculoskeletal: No deformities, no cyanosis or clubbing Neuro:  alert, non focal   Assessment & Plan:   I reviewed imaging findings of left upper lobe cavitary lesion, mediastinal lymphadenopathy and multiple pulmonary nodules with patient.  He does not have any symptoms to suggest tuberculosis.  He does have post Covid fatigue but surprisingly does not even have infiltrates to suggest Covid pneumonia.  He is vaccinated although not boosted.  Repeat Covid testing by PCR has been negative which is reassuring. I am more concerned about malignancy in this heavy ex-smoker.  I discussed that further work-up would include a PET scan and possible biopsy.  I discussed forms of biopsy including CT-guided biopsy of left upper lobe cavity which would have a high chance of  pneumothorax.  The best approach would be to proceed with EBUS of 4R lymph node to establish the diagnosis of malignancy.  We would also obtain brushings at the same time of left upper lobe cavitary lesion.  I discussed risks of the procedure including that of general anesthesia and small risk of bleeding and worsening bleeding associated with this. I discussed with infectious disease and since his PCR is negative it would be okay to proceed with this procedure.  I will schedule with an endoscopy with adequate precautions.  I am not concerned about tuberculosis here.  He has had BCG installed during bladder carcinoma treatment.  He has undergone multiple negative TB test in the past  Emphysema -no evidence of bronchospasm Would empirically use IV Solu-Medrol 40 mg daily   Labs   CBC: Recent Labs  Lab 02/18/20 2125 02/20/20 0318  WBC 9.5 13.4*  NEUTROABS 7.8*  --   HGB 12.0* 11.5*  HCT 35.4* 33.2*  MCV 84.1 82.8  PLT 304 627   Basic Metabolic Panel: Recent Labs  Lab 02/18/20 2125 02/20/20 0318  NA 136 135  K 4.1 4.4  CL 102 103  CO2 23 20*  GLUCOSE 125* 165*  BUN 20 26*  CREATININE 2.02* 1.46*  CALCIUM 8.9 9.0  MG  --  1.5*  PHOS  --  3.3   GFR: Estimated Creatinine Clearance: 54.6 mL/min (A) (by C-G formula based on SCr of 1.46 mg/dL (H)). Recent Labs  Lab 02/18/20 2125 02/18/20 2234 02/20/20 0318  PROCALCITON  --   --  <0.10  WBC 9.5  --  13.4*  LATICACIDVEN  --  1.2  --    Liver Function Tests: Recent Labs  Lab 02/18/20 2125 02/20/20 0318  AST 17 19  ALT 20 25  ALKPHOS 78 75  BILITOT 0.2* 0.3  PROT 7.2 6.6  ALBUMIN 3.6 3.2*   No results for input(s): LIPASE, AMYLASE in the last 168 hours. No results for input(s): AMMONIA in the last 168 hours. ABG No results found for: PHART, PCO2ART, PO2ART, HCO3, TCO2, ACIDBASEDEF, O2SAT  Coagulation Profile: Recent Labs  Lab 02/20/20 0318  INR 1.1   Cardiac Enzymes: No results for input(s): CKTOTAL, CKMB,  CKMBINDEX, TROPONINI in the last 168 hours. HbA1C: No results found for: HGBA1C CBG: No results for input(s): GLUCAP in the last 168 hours.  Review of Systems:   Generalized weakness and malaise Fatigue Mild dyspnea  Past medical history  He,  has a past medical history of Bladder cancer ([place]) and COPD (chronic obstructive pulmonary disease) ([place]).   Surgical History    Past Surgical History:  Procedure Laterality Date  . RENAL ARTERY STENT  2009, 09/15/2016     Social History   reports that he has quit smoking. His smoking use included e-cigarettes. He has never used smokeless tobacco. He reports current alcohol use.   Family history   His family history is not on file.   Allergies Allergies  Allergen Reactions  . Penicillins  Unknown, pt was a child     Home meds  Prior to Admission medications   Medication Sig Start Date End Date Taking? Authorizing Provider  albuterol (VENTOLIN HFA) 108 (90 Base) MCG/ACT inhaler Inhale 2 puffs into the lungs daily as needed for wheezing or shortness of breath. 11/11/15  Yes [provider]  escitalopram (LEXAPRO) 20 MG tablet Take 20 mg by mouth daily. 02/03/20  Yes [provider]  Fluticasone-Salmeterol (ADVAIR) 100-50 MCG/DOSE AEPB Inhale 1 puff into the lungs in the morning and at bedtime. 03/01/15  Yes [provider]  omeprazole (PRILOSEC OTC) 20 MG tablet Take 20 mg by mouth daily.   Yes [provider]  tamsulosin (FLOMAX) 0.4 MG CAPS capsule Take 0.4 mg by mouth daily. 07/10/15  Yes [provider]    Kara Mead MD. FCCP. [facility] See Amion for pager  If no response to pager , please call [phone]  After 7:00 pm call Elink  [phone]   02/20/2020

## 2020-02-20 NOTE — H&P (Addendum)
History and Physical  Corey Dixon XHB:[id number] DOB: 02-Apr-1955 DOA: 02/18/2020  Referring physician: Drenda Freeze, MD PCP: Arman Bogus., MD  Patient coming from: [facility]  Chief Complaint: Shortness of breath  HPI: Corey Dixon is a 65 y.o. male with medical history significant for COPD, GERD and bladder cancer s/p BCG who presents to [facility] ED due to increased work of breathing, reproducible chest pain that has been ongoing since diagnosed with COVID on 1/10. He states that he had nausea and vomiting within the first and second days of onset of symptoms, but this quickly resolved. His O2 sat at home has not been lower than 96% at home,he also complained of 10lb weight loss since onset of symptoms. He was worried due to increased work of breathing and his other symptoms, so he decided to go to [facility] ED for further evaluation. Patient endorsed history of bladder cancer status post chemotherapy and right nephrectomy.            ED Course:  In the emergency department, he was hemodynamically stable.  Work-up in the ED showed normocytic anemia, BUN to creatinine 20/2.02 (no prior labs for comparison).  Lactic acid 1.2, troponin x1 - 3.  He was reported to be diagnosed with COVID on 1/10. Chest x-ray was suggestive of developing cavitary lesion in the left apex CT chest without contrast showed Left upper lobe opacity corresponds to a cavitary mass within the left upper lobe possibly representing a post inflammatory cavity or, less likely, a cavitary neoplasm Dr. Tommy Medal from ID was consulted by ED physician and states that it could be MRSA vs postinflammatory activity and the patient may need AFB or bronchoscopy for further evaluation.  Patient was started on IV Vanco and cefepime.  Hospitalist was asked to admit patient for further evaluation and management.  Review of Systems: Constitutional: Negative for chills and fever.  HENT: Negative for ear pain and sore throat.   Eyes: Negative for  pain and visual disturbance.  Respiratory: Positive for shortness of breath.  Cardiovascular: Positive for producible chest pain. Negative for palpitations.  Gastrointestinal: Negative for abdominal pain and vomiting.  Endocrine: Negative for polyphagia and polyuria.  Genitourinary: Negative for decreased urine volume, dysuria, enuresis Musculoskeletal: Negative for arthralgias and back pain.  Skin: Negative for color change and rash.  Allergic/Immunologic: Negative for immunocompromised state.  Neurological: Negative for tremors, syncope, speech difficulty, weakness, light-headedness and headaches.  Hematological: Does not bruise/bleed easily.  All other systems reviewed and are negative  Past Medical History:  Diagnosis Date  . Bladder cancer ([place])   . COPD (chronic obstructive pulmonary disease) ([place])    Past Surgical History:  Procedure Laterality Date  . RENAL ARTERY STENT  2009, 09/15/2016    Social History:  reports that he has quit smoking. His smoking use included e-cigarettes. He has never used smokeless tobacco. He reports current alcohol use. No history on file for drug use.   Allergies  Allergen Reactions  . Penicillins     Unknown, pt was a child    History reviewed. No pertinent family history.   Prior to Admission medications   Medication Sig Start Date End Date Taking? Authorizing Provider  albuterol (VENTOLIN HFA) 108 (90 Base) MCG/ACT inhaler Inhale 2 puffs into the lungs daily as needed for wheezing or shortness of breath. 11/11/15  Yes [provider]  escitalopram (LEXAPRO) 20 MG tablet Take 20 mg by mouth daily. 02/03/20  Yes [provider]  Fluticasone-Salmeterol (  ADVAIR) 100-50 MCG/DOSE AEPB Inhale 1 puff into the lungs in the morning and at bedtime. 03/01/15  Yes [provider]  omeprazole (PRILOSEC OTC) 20 MG tablet Take 20 mg by mouth daily.   Yes [provider]  tamsulosin (FLOMAX) 0.4 MG CAPS capsule Take 0.4 mg  by mouth daily. 07/10/15  Yes [provider]    Physical Exam: BP (!) 145/83 (BP Location: Left Arm)   Pulse 90   Temp 97.7 F (36.5 C) (Oral)   Resp 16   Ht 6' (1.829 m)   Wt 81.9 kg   SpO2 94%   BMI 24.49 kg/m   . General: 65 y.o. year-old male well developed well nourished in no acute distress.  Alert and oriented x3. Marland Kitchen HEENT: NCAT, EOMI . Neck: Supple, trachea medial . Cardiovascular: Regular rate and rhythm with no rubs or gallops.  No thyromegaly or JVD noted.  2/4 pulses in all 4 extremities. Marland Kitchen Respiratory: Decreased breath sounds ( L > R ).  . Abdomen: Soft nontender nondistended with normal bowel sounds x4 quadrants. . Muskuloskeletal: No cyanosis, clubbing or edema noted bilaterally . Neuro: CN II-XII intact, strength, sensation, reflexes intact . Skin: No ulcerative lesions noted or rashes . Psychiatry: Judgement and insight appear normal. Mood is appropriate for condition and setting          Labs on Admission:  Basic Metabolic Panel: Recent Labs  Lab 02/18/20 2125  NA 136  K 4.1  CL 102  CO2 23  GLUCOSE 125*  BUN 20  CREATININE 2.02*  CALCIUM 8.9   Liver Function Tests: Recent Labs  Lab 02/18/20 2125  AST 17  ALT 20  ALKPHOS 78  BILITOT 0.2*  PROT 7.2  ALBUMIN 3.6   No results for input(s): LIPASE, AMYLASE in the last 168 hours. No results for input(s): AMMONIA in the last 168 hours. CBC: Recent Labs  Lab 02/18/20 2125  WBC 9.5  NEUTROABS 7.8*  HGB 12.0*  HCT 35.4*  MCV 84.1  PLT 304   Cardiac Enzymes: No results for input(s): CKTOTAL, CKMB, CKMBINDEX, TROPONINI in the last 168 hours.  BNP (last 3 results) No results for input(s): BNP in the last 8760 hours.  ProBNP (last 3 results) No results for input(s): PROBNP in the last 8760 hours.  CBG: No results for input(s): GLUCAP in the last 168 hours.  Radiological Exams on Admission: CT CHEST WO CONTRAST  Result Date: 02/18/2020 CLINICAL DATA:  Chest pain, dyspnea,  COVID positive EXAM: CT CHEST WITHOUT CONTRAST TECHNIQUE: Multidetector CT imaging of the chest was performed following the standard protocol without IV contrast. COMPARISON:  None. FINDINGS: Cardiovascular: Mild coronary artery calcification. Global cardiac size within normal limits. Trace pericardial effusion. The central pulmonary arteries are of normal caliber. Mild atherosclerotic calcification within the thoracic aorta. No aortic aneurysm. Mediastinum/Nodes: Pathologic right paratracheal and aortopulmonary lymphadenopathy is present with the index lymph node seen within the right paratracheal region measuring 2.0 x 3.1 cm at axial image # 57. The esophagus is unremarkable. Thyroid unremarkable. Lungs/Pleura: Severe, apically predominant centrilobular emphysema. The irregular opacity noted within the left apex on prior chest radiograph corresponds to a oblong cavitary mass measuring 2.1 x 2.1 x 3.7 cm in greatest dimension on axial image # 30 and coronal image # 64. The lesion demonstrates a a mildly irregular soft tissue rind and a cavitary neoplasm is difficult to exclude. Alternatively, this may represent the residua of a cavitary infectious process. No superimposed focal pulmonary infiltrate  is identified. There are scattered throughout the lungs in a random distribution demonstrating a lower lung zone predominance at least 30 pulmonary nodules measuring up to 15 mm within the left upper lobe at axial image # 61. These are suspicious for pulmonary metastases in these may represent metastatic disease from an unknown primary versus is intra pulmonary metastases secondary to a primary pulmonary malignancy. No pneumothorax or pleural effusion. The central airways are widely patent. Upper Abdomen: A surgical staple line is seen inferior to the right adrenal gland which may reflect surgical changes of a right nephrectomy, though this is not definitively identified on this examination. No acute abnormality within  the upper abdomen. Musculoskeletal: No lytic or blastic bone lesions are identified. IMPRESSION: Left upper lobe opacity corresponds to a cavitary mass within the left upper lobe possibly representing a post inflammatory cavity or, less likely, a cavitary neoplasm. Innumerable pulmonary nodules distributed randomly with a lower lobe predominance most in keeping with pulmonary metastatic disease. Pathologic mediastinal adenopathy also identified. Severe centrilobular emphysema. Aortic Atherosclerosis (ICD10-I70.0). Electronically Signed   By: Fidela Salisbury MD   On: 02/18/2020 22:42   DG Chest Port 1 View  Result Date: 02/18/2020 CLINICAL DATA:  Chest pain and shortness of breath since positive COVID test from 2 weeks ago. EXAM: PORTABLE CHEST 1 VIEW COMPARISON:  07/18/2017 FINDINGS: Heart size and pulmonary vascularity are normal. Emphysematous changes in the lungs. Scattered fibrosis. There is suggestion of a developing cavitary lesion in the left apex measuring 2.4 cm diameter. CT suggested for further evaluation. No pleural effusions. No pneumothorax. Mediastinal contours appear intact. IMPRESSION: 1. Suggestion of developing cavitary lesion in the left apex. CT suggested for further evaluation. 2. Emphysematous changes and fibrosis in the lungs. Electronically Signed   By: Lucienne Capers M.D.   On: 02/18/2020 21:15    EKG: I independently viewed the EKG done and my findings are as followed: Normal sinus rhythm at a rate of 92 bpm  Assessment/Plan Present on Admission: **None**  Principal Problem:   Cavitary pneumonia Active Problems:   COVID-19 virus infection   AKI (acute kidney injury) ([place])   COPD (chronic obstructive pulmonary disease) ([facility])   History of bladder cancer   Pulmonary nodules/lesions, multiple   Presumed cavitary pneumonia in the setting of COVID-19 virus infection CT showed left upper lobe lesion with suspicion for possible pneumonia He was started on IV vancomycin  and cefepime, we shall continue with same at this time with plan to de-escalate based on procalcitonin level, blood culture, urine Legionella, strep pneumo and sputum culture Continue incentive spirometry, flutter valve Patient states that he has had multiple negative TB test in the past since he had to get BCG for his bladder cancer.  Last evaluation was 2019 per patient. There was consideration that patient may need AFB or bronchoscopy for further evaluation. Infectious disease already consulted by ED physician at [facility], we shall await further recommendation Continue DuoNebs every 4 hours as needed Continue vitamin-C 500 mg p.o. Daily Continue zinc 220 mg p.o. Daily Continue Mucinex, Robitussin and Tussionex Continue Tylenol p.r.n. for fever and chest pain Patient does not require any oxygen at this time Continue monitoring daily inflammatory markers Physician PPE:  Surgical mask with face shield, N-95, nonsterile gloves, disposable gown, head and shoe covers Patient PPE:  Face mask   Multiple pulmonary nodules CT of chest showed innumerable pulmonary nodules distributed randomly with a lower lobe predominance most in keeping with pulmonary metastatic disease. Pathologic mediastinal adenopathy  also identified This could have also contributed to patient's increased work of breathing Consider pulmonology consult  COPD  Continue DuoNebs as needed  ??  Acute kidney injury BUN/creatinine 28/2.02; GFR 36 (no prior labs for comparison) Its possible that patient call of her chronic kidney disease considering his history of right sided nephrectomy Renally adjust medications, avoid nephrotoxic agents/dehydration/hypotension  History of bladder cancer S/P BCG Stable   DVT prophylaxis: Lovenox  Code Status: Full code  Family Communication: None at bedside  Disposition Plan:  Patient is from:                        home Anticipated DC to:                   SNF or family members  home Anticipated DC date:               2-3 days Anticipated DC barriers:           Patient is unstable to be discharged at this time due to presumed cavitary pneumonia requiring inpatient treatment  Consults called: Infectious disease  Admission status: Inpatient   Bernadette Hoit MD [facility]  02/20/2020, 2:24 AM

## 2020-02-20 NOTE — Consult Note (Signed)
Date of Admission:  02/18/2020          Reason for Consult: Cavitary pneumonia    Referring Provider: Niel Hummer, MD   Assessment:  1. Cavitary lung lesion, with multiple pulmonary nodules: Differential include complication of Covid infection, tuberculosis, disseminated fungal infection or metastatic disease. Patient has travel to [place] areas such as [place] and [place] in the last few years making disseminated fungal infection a possible cause of cavitary lesion. Patient's international travel to [place] also places him at risk of TB infection. Metastatic disease also possible in the setting of CT showing multiple pulmonary nodules and his treated bladder cancer (s/p chemo and BCG therapy) and kidney cancer (s/p R nephrectomy).  2. COVID-19 pneumonia: Diagnosed on 02/05/2020 s/p outpatient monoclonal antibody treatment. Currently afebrile with mild leuk. Negative pro-calcitonin. Patient currently out of the infectious window.  Plan:  1. Discontinue IV vancomycin and cefepime.  2. Pulmonology consult for evaluation with bronchoscopy w/ biopsy 3. Follow-up AFB and sputum culture 4. Follow-up disseminated fungi labs  ID will continue to follow  Principal Problem:   Cavitary pneumonia Active Problems:   COVID-19 virus infection   AKI (acute kidney injury) ([place])   COPD (chronic obstructive pulmonary disease) (H[facility])   History of bladder cancer   Pulmonary nodules/lesions, multiple   Scheduled Meds: . vitamin C  500 mg Oral Daily  . enoxaparin (LOVENOX) injection  40 mg Subcutaneous Q24H  . [START ON 02/21/2020] influenza vaccine adjuvanted  0.5 mL Intramuscular Tomorrow-1000  . melatonin  5 mg Oral QHS  . zinc sulfate  220 mg Oral Daily   Continuous Infusions: . sodium chloride Stopped (02/19/20 1718)  . magnesium sulfate bolus IVPB 2 g (02/20/20 1533)   PRN Meds:.sodium chloride, acetaminophen, bisacodyl, chlorpheniramine-HYDROcodone, guaiFENesin-dextromethorphan,  ipratropium-albuterol, ondansetron **OR** ondansetron (ZOFRAN) IV  HPI: Corey Dixon is a 65 y.o. male with PMH of COPD, bladder cancer is post chemo and BCG in the right nephrectomy, GERD and a recent Covid infection who presented with worsening dyspnea and found to have a cavitary lesion in pulmonary nodules on CT chest.   Patient states he was diagnosed with bladder cancer in 2009.  It was initially treated with Gemcitabine before he was transitioned to BCG therapy. Patient reports he had multiple negative TB test before starting BCG treatment. Patient's bladder cancer recurred in 2019 which was treated with chemotherapy and right nephrectomy. He was also found to have a 6 mm pulmonary nodule in the left upper lobe of his lung in 2017. He has received serial x-rays to monitor this nodule with no significant increase in size since then.   Patient was exposed to Covid tested positive on the 02/05/20. He received monoclonal antibody in the outpatient setting. Patient states his symptoms included some shortness of breath, headaches, N/V and fatigue. Over the weekend, he started having worsening shortness of breath and fatigue so he was seen at [facility] ED. He reports 10 pound weight loss since his Covid infection. Chest x-ray in the ED showed left upper lobe cavity and a CT chest confirmed the LUL cavitary lesion in addition to multiple pulmonary nodules bilaterally.  Patient was started on empiric antibiotics with IV vancomycin and cefepime.  Infectious disease was consulted to evaluate patient for possible cavitary pneumonia  On assessment, patient denies any fever, chills, cough, night sweats, nausea or vomiting. He does endorse constant headache that has not improved with Tylenol and mild SOB.  He also endorses chest pain with  deep inspiration. Patient also states his employer has moved him around throughout the [place]. He traveled to [place] a few years ago and often travels to [place] or goes on a cruise  to [place]. He denies exposure to prisoners.   Review of Systems: Review of Systems  Constitutional: Positive for weight loss. Negative for chills and fever.  Respiratory: Positive for shortness of breath. Negative for hemoptysis.   Cardiovascular: Positive for chest pain. Negative for palpitations and leg swelling.  Gastrointestinal: Negative for nausea and vomiting.  Genitourinary: Negative for hematuria.  Neurological: Negative for dizziness and weakness.    Past Medical History:  Diagnosis Date  . Bladder cancer ([place])   . COPD (chronic obstructive pulmonary disease) ([facility])     Social History   Tobacco Use  . Smoking status: Former Smoker    Types: E-cigarettes  . Smokeless tobacco: Never Used  Substance Use Topics  . Alcohol use: Yes    History reviewed. No pertinent family history. Allergies  Allergen Reactions  . Penicillins     Unknown, pt was a child    OBJECTIVE: Blood pressure (!) 171/85, pulse 77, temperature 98 F (36.7 C), temperature source Oral, resp. rate 18, height 6' (1.829 m), weight 81.9 kg, SpO2 97 %.  Physical Exam Constitutional:      Appearance: He is well-developed.  HENT:     Head: Normocephalic and atraumatic.     Mouth/Throat:     Mouth: Mucous membranes are moist.     Dentition: Normal dentition. No dental abscesses.  Cardiovascular:     Rate and Rhythm: Normal rate and regular rhythm.     Heart sounds: Normal heart sounds.  Pulmonary:     Effort: Pulmonary effort is normal. No tachypnea.     Breath sounds: Examination of the right-upper field reveals rales. Decreased breath sounds and rales present.  Abdominal:     General: Bowel sounds are normal.     Palpations: Abdomen is soft.  Musculoskeletal:        General: Normal range of motion.     Cervical back: Normal range of motion and neck supple.  Skin:    General: Skin is warm and dry.     Capillary Refill: Capillary refill takes less than 2 seconds.  Neurological:      General: No focal deficit present.     Mental Status: He is alert and oriented to person, place, and time.  Psychiatric:        Mood and Affect: Mood normal.     Lab Results Lab Results  Component Value Date   WBC 13.4 (H) 02/20/2020   HGB 11.5 (L) 02/20/2020   HCT 33.2 (L) 02/20/2020   MCV 82.8 02/20/2020   PLT 298 02/20/2020    Lab Results  Component Value Date   CREATININE 1.46 (H) 02/20/2020   BUN 26 (H) 02/20/2020   NA 135 02/20/2020   K 4.4 02/20/2020   CL 103 02/20/2020   CO2 20 (L) 02/20/2020    Lab Results  Component Value Date   ALT 25 02/20/2020   AST 19 02/20/2020   ALKPHOS 75 02/20/2020   BILITOT 0.3 02/20/2020     Microbiology: Recent Results (from the past 240 hour(s))  Blood culture (routine x 2)     Status: None (Preliminary result)   Collection Time: 02/18/20 10:25 PM   Specimen: BLOOD  Result Value Ref Range Status   Specimen Description BLOOD LEFT ANTECUBITAL  Final   Special Requests   Final  BOTTLES DRAWN AEROBIC AND ANAEROBIC Blood Culture adequate volume Performed at [facility], [place] [address], [place], [place] [address]    Culture PENDING  Incomplete   Report Status PENDING  Incomplete  Blood culture (routine x 2)     Status: None (Preliminary result)   Collection Time: 02/18/20 10:30 PM   Specimen: BLOOD  Result Value Ref Range Status   Specimen Description BLOOD BLOOD RIGHT FOREARM  Final   Special Requests   Final    BOTTLES DRAWN AEROBIC AND ANAEROBIC Blood Culture adequate volume Performed at [facility], [place] [address], [place], [place] [address]    Culture PENDING  Incomplete   Report Status PENDING  Incomplete  SARS CORONAVIRUS 2 (TAT 6-24 HRS)     Status: None   Collection Time: 02/20/20  2:24 AM  Result Value Ref Range Status   SARS Coronavirus 2 NEGATIVE NEGATIVE Final    Comment: (NOTE) SARS-CoV-2 target nucleic acids are NOT DETECTED.  The SARS-CoV-2 RNA is generally detectable in upper and  lower respiratory specimens during the acute phase of infection. Negative results do not preclude SARS-CoV-2 infection, do not rule out co-infections with other pathogens, and should not be used as the sole basis for treatment or other patient management decisions. Negative results must be combined with clinical observations, patient history, and epidemiological information. The expected result is Negative.  Fact Sheet for Patients: SugarRoll.be  Fact Sheet for Healthcare Providers: https://www.woods-mathews.com/  This test is not yet approved or cleared by the [place] FDA and  has been authorized for detection and/or diagnosis of SARS-CoV-2 by FDA under an Emergency Use Authorization (EUA). This EUA will remain  in effect (meaning this test can be used) for the duration of the COVID-19 declaration under Se ction 564(b)(1) of the Act, 21 U.S.C. section 360bbb-3(b)(1), unless the authorization is terminated or revoked sooner.  Performed at [facility], [place] [address], [place], [place] [address]     Corianne Buccellato M Jonika Critz, [facility] [phone] pager  02/20/2020, 2:02 PM

## 2020-02-20 NOTE — Progress Notes (Incomplete)
PROGRESS NOTE    Corey Dixon  TFT:[id number] DOB: [birth year] DOA: 02/18/2020 PCP: Arman Bogus., MD   Brief Narrative:    Assessment & Plan:   Principal Problem:   Cavitary pneumonia Active Problems:   COVID-19 virus infection   AKI (acute kidney injury) ([place])   COPD (chronic obstructive pulmonary disease) ([place])   History of bladder cancer   Pulmonary nodules/lesions, multiple   ***                   Estimated body mass index is 24.49 kg/m as calculated from the following:   Height as of this encounter: 6' (1.829 m).   Weight as of this encounter: 81.9 kg.   DVT prophylaxis:  Code Status:  Family Communication: Disposition Plan:  Status is: Inpatient  {Inpatient:23812}  Dispo: The patient is from: {From:23814}              Anticipated d/c is to: {To:23815}              Anticipated d/c date is: {Days:23816}              Patient currently {Medically stable:23817}   Difficult to place patient {Yes/No:25151}        Consultants:   ***  Procedures:   ***  Antimicrobials:    Subjective: ***  Objective: Vitals:   02/19/20 2215 02/19/20 2304 02/20/20 0025 02/20/20 0609  BP: (!) 147/72 (!) 147/72 (!) 145/83 (!) 167/85  Pulse: 93 93 90 95  Resp: 20 20 16 18   Temp: 98.2 F (36.8 C) 98.2 F (36.8 C) 97.7 F (36.5 C) 98.1 F (36.7 C)  TempSrc: Oral Oral Oral Oral  SpO2: 96% 98% 94% 94%  Weight:   81.9 kg   Height:        Intake/Output Summary (Last 24 hours) at 02/20/2020 0727 Last data filed at 02/20/2020 0200 Gross per 24 hour  Intake 553.49 ml  Output 0 ml  Net 553.49 ml   Filed Weights   02/18/20 2027 02/20/20 0025  Weight: 83.9 kg 81.9 kg    Examination:  General exam: Appears calm and comfortable  Respiratory system: Clear to auscultation. Respiratory effort normal. Cardiovascular system: S1 & S2 heard, RRR. No JVD, murmurs, rubs, gallops or clicks. No pedal edema. Gastrointestinal system: Abdomen is  nondistended, soft and nontender. No organomegaly or masses felt. Normal bowel sounds heard. Central nervous system: Alert and oriented. No focal neurological deficits. Extremities: Symmetric 5 x 5 power. Skin: No rashes, lesions or ulcers Psychiatry: Judgement and insight appear normal. Mood & affect appropriate.     Data Reviewed: I have personally reviewed following labs and imaging studies  CBC: Recent Labs  Lab 02/18/20 2125 02/20/20 0318  WBC 9.5 13.4*  NEUTROABS 7.8*  --   HGB 12.0* 11.5*  HCT 35.4* 33.2*  MCV 84.1 82.8  PLT 304 706   Basic Metabolic Panel: Recent Labs  Lab 02/18/20 2125 02/20/20 0318  NA 136 135  K 4.1 4.4  CL 102 103  CO2 23 20*  GLUCOSE 125* 165*  BUN 20 26*  CREATININE 2.02* 1.46*  CALCIUM 8.9 9.0  MG  --  1.5*  PHOS  --  3.3   GFR: Estimated Creatinine Clearance: 54.6 mL/min (A) (by C-G formula based on SCr of 1.46 mg/dL (H)). Liver Function Tests: Recent Labs  Lab 02/18/20 2125 02/20/20 0318  AST 17 19  ALT 20 25  ALKPHOS 78 75  BILITOT 0.2* 0.3  PROT  7.2 6.6  ALBUMIN 3.6 3.2*   No results for input(s): LIPASE, AMYLASE in the last 168 hours. No results for input(s): AMMONIA in the last 168 hours. Coagulation Profile: Recent Labs  Lab 02/20/20 0318  INR 1.1   Cardiac Enzymes: No results for input(s): CKTOTAL, CKMB, CKMBINDEX, TROPONINI in the last 168 hours. BNP (last 3 results) No results for input(s): PROBNP in the last 8760 hours. HbA1C: No results for input(s): HGBA1C in the last 72 hours. CBG: No results for input(s): GLUCAP in the last 168 hours. Lipid Profile: No results for input(s): CHOL, HDL, LDLCALC, TRIG, CHOLHDL, LDLDIRECT in the last 72 hours. Thyroid Function Tests: No results for input(s): TSH, T4TOTAL, FREET4, T3FREE, THYROIDAB in the last 72 hours. Anemia Panel: No results for input(s): VITAMINB12, FOLATE, FERRITIN, TIBC, IRON, RETICCTPCT in the last 72 hours. Sepsis Labs: Recent Labs  Lab  02/18/20 2234 02/20/20 0318  PROCALCITON  --  <0.10  LATICACIDVEN 1.2  --     Recent Results (from the past 240 hour(s))  Blood culture (routine x 2)     Status: None (Preliminary result)   Collection Time: 02/18/20 10:25 PM   Specimen: BLOOD  Result Value Ref Range Status   Specimen Description BLOOD LEFT ANTECUBITAL  Final   Special Requests   Final    BOTTLES DRAWN AEROBIC AND ANAEROBIC Blood Culture adequate volume Performed at [facility], [place] [address] [place], [place] [address]    Culture PENDING  Incomplete   Report Status PENDING  Incomplete  Blood culture (routine x 2)     Status: None (Preliminary result)   Collection Time: 02/18/20 10:30 PM   Specimen: BLOOD  Result Value Ref Range Status   Specimen Description BLOOD BLOOD RIGHT FOREARM  Final   Special Requests   Final    BOTTLES DRAWN AEROBIC AND ANAEROBIC Blood Culture adequate volume Performed at [facility], [place] [address] [place],  [address]    Culture PENDING  Incomplete   Report Status PENDING  Incomplete         Radiology Studies: CT CHEST WO CONTRAST  Result Date: 02/18/2020 CLINICAL DATA:  Chest pain, dyspnea, COVID positive EXAM: CT CHEST WITHOUT CONTRAST TECHNIQUE: Multidetector CT imaging of the chest was performed following the standard protocol without IV contrast. COMPARISON:  None. FINDINGS: Cardiovascular: Mild coronary artery calcification. Global cardiac size within normal limits. Trace pericardial effusion. The central pulmonary arteries are of normal caliber. Mild atherosclerotic calcification within the thoracic aorta. No aortic aneurysm. Mediastinum/Nodes: Pathologic right paratracheal and aortopulmonary lymphadenopathy is present with the index lymph node seen within the right paratracheal region measuring 2.0 x 3.1 cm at axial image # 57. The esophagus is unremarkable. Thyroid unremarkable. Lungs/Pleura: Severe, apically predominant centrilobular emphysema. The  irregular opacity noted within the left apex on prior chest radiograph corresponds to a oblong cavitary mass measuring 2.1 x 2.1 x 3.7 cm in greatest dimension on axial image # 30 and coronal image # 64. The lesion demonstrates a a mildly irregular soft tissue rind and a cavitary neoplasm is difficult to exclude. Alternatively, this may represent the residua of a cavitary infectious process. No superimposed focal pulmonary infiltrate is identified. There are scattered throughout the lungs in a random distribution demonstrating a lower lung zone predominance at least 30 pulmonary nodules measuring up to 15 mm within the left upper lobe at axial image # 61. These are suspicious for pulmonary metastases in these may represent metastatic disease from an  unknown primary versus is intra pulmonary metastases secondary to a primary pulmonary malignancy. No pneumothorax or pleural effusion. The central airways are widely patent. Upper Abdomen: A surgical staple line is seen inferior to the right adrenal gland which may reflect surgical changes of a right nephrectomy, though this is not definitively identified on this examination. No acute abnormality within the upper abdomen. Musculoskeletal: No lytic or blastic bone lesions are identified. IMPRESSION: Left upper lobe opacity corresponds to a cavitary mass within the left upper lobe possibly representing a post inflammatory cavity or, less likely, a cavitary neoplasm. Innumerable pulmonary nodules distributed randomly with a lower lobe predominance most in keeping with pulmonary metastatic disease. Pathologic mediastinal adenopathy also identified. Severe centrilobular emphysema. Aortic Atherosclerosis (ICD10-I70.0). Electronically Signed   By: Fidela Salisbury MD   On: 02/18/2020 22:42   DG Chest Port 1 View  Result Date: 02/18/2020 CLINICAL DATA:  Chest pain and shortness of breath since positive COVID test from 2 weeks ago. EXAM: PORTABLE CHEST 1 VIEW COMPARISON:   07/18/2017 FINDINGS: Heart size and pulmonary vascularity are normal. Emphysematous changes in the lungs. Scattered fibrosis. There is suggestion of a developing cavitary lesion in the left apex measuring 2.4 cm diameter. CT suggested for further evaluation. No pleural effusions. No pneumothorax. Mediastinal contours appear intact. IMPRESSION: 1. Suggestion of developing cavitary lesion in the left apex. CT suggested for further evaluation. 2. Emphysematous changes and fibrosis in the lungs. Electronically Signed   By: Lucienne Capers M.D.   On: 02/18/2020 21:15        Scheduled Meds: . vitamin C  500 mg Oral Daily  . enoxaparin (LOVENOX) injection  40 mg Subcutaneous Q24H  . [START ON 02/21/2020] influenza vaccine adjuvanted  0.5 mL Intramuscular Tomorrow-1000  . melatonin  5 mg Oral QHS  . zinc sulfate  220 mg Oral Daily   Continuous Infusions: . sodium chloride Stopped (02/19/20 1718)  . ceFEPime (MAXIPIME) IV 2 g (02/20/20 0133)  . vancomycin Stopped (02/19/20 1718)     LOS: 0 days    Time spent: 35 minutes.     Elmarie Shiley, MD [facility]   If 7PM-7AM, please contact night-coverage [email or web address]  02/20/2020, 7:27 AM

## 2020-02-20 NOTE — Progress Notes (Signed)
PROGRESS NOTE    Corey Dixon  SEG:[id number] DOB: [birth year] DOA: 02/18/2020 PCP: Arman Bogus., MD   Brief Narrative: 65 year old with past medical history significant for COPD, GERD, bladder cancer status post BCG who presented to [facility] due to increased work of breathing, chest pain that has been going on since his diagnosis of Covid on 1/10.  Patient had nausea and vomiting initially with the  diagnosis of Covid.  Subsequently that resolved.  He reports 10 pounds weight loss since his symptoms are started.  With worsening increased work of breathing.  Evaluation in the ED creatinine of 2.0, lactic acid 1.2.  Chest x-ray was suggestive of developing cavitary lesion in the left apex.  CT chest without contrast showed left upper lobe opacity which corresponds to a cavitary mass within left upper lobe possible representing postinflammatory cavitary lesion or a cavitary neoplasm.  AFB sputum ordered.  ID and pulmonologist has been consulted     Assessment & Plan:   Principal Problem:   Cavitary pneumonia Active Problems:   COVID-19 virus infection   AKI (acute kidney injury) ([place])   COPD (chronic obstructive pulmonary disease) ([place])   History of bladder cancer   Pulmonary nodules/lesions, multiple  1-Cavitary lung mass; Multiples Pulmonary nodule; Infectious vs neoplasm.  ID consulted, recommend to stop antibiotics, AFB sputum and pulmonary consultation.  Recent covid infection 10 days ago. PCR in hospital negative.  Follow: Cryptococcal antigen, histoplasma antigen, Legionella antigen,coccidiodes, Blastomyces antigen, acid-fast smear.   2-COPD; continue with inhaler.   3-AKI: Improved with IV fluids Cr peak to 2.0. Cr down to 1.4  4-History of bladder cancer, S.P CBG.   5-Hypomagnesemia; replete IV>  6-Anemia; check anemia panel.   Estimated body mass index is 24.49 kg/m as calculated from the following:   Height as of this encounter: 6' (1.829 m).   Weight as  of this encounter: 81.9 kg.   DVT prophylaxis: Lovenox Code Status: Full Code Family Communication: care discussed with patient.  Disposition Plan:  Status is: Inpatient  Remains inpatient appropriate because:Ongoing diagnostic testing needed not appropriate for outpatient work up   Dispo: The patient is from: Home              Anticipated d/c is to: Home              Anticipated d/c date is: 3 days              Patient currently is not medically stable to d/c.   Difficult to place patient No        Consultants:   Pulmonology  ID  Procedures:   None  Antimicrobials:    Subjective: Alert, denies worsening shortness of breath.  He report mild cough. He report night sweats.  Weight loss, when he was having symptoms of nausea and vomiting  Objective: Vitals:   02/19/20 2304 02/20/20 0025 02/20/20 0609 02/20/20 0858  BP: (!) 147/72 (!) 145/83 (!) 167/85 (!) 171/85  Pulse: 93 90 95 77  Resp: 20 16 18 18   Temp: 98.2 F (36.8 C) 97.7 F (36.5 C) 98.1 F (36.7 C) 98 F (36.7 C)  TempSrc: Oral Oral Oral Oral  SpO2: 98% 94% 94% 97%  Weight:  81.9 kg    Height:        Intake/Output Summary (Last 24 hours) at 02/20/2020 1117 Last data filed at 02/20/2020 0900 Gross per 24 hour  Intake 793.49 ml  Output 0 ml  Net 793.49 ml  Filed Weights   02/18/20 2027 02/20/20 0025  Weight: 83.9 kg 81.9 kg    Examination:  General exam: Appears calm and comfortable  Respiratory system: Bilateral rhonchorous Cardiovascular system: S1 & S2 heard, RRR. No JVD, murmurs, rubs, gallops or clicks. No pedal edema. Gastrointestinal system: Abdomen is nondistended, soft and nontender. No organomegaly or masses felt. Normal bowel sounds heard. Central nervous system: Alert and oriented. No focal neurological deficits. Extremities: Symmetric 5 x 5 power. Skin: No rashes, lesions or ulcers Psychiatry: Judgement and insight appear normal. Mood & affect appropriate.     Data  Reviewed: I have personally reviewed following labs and imaging studies  CBC: Recent Labs  Lab 02/18/20 2125 02/20/20 0318  WBC 9.5 13.4*  NEUTROABS 7.8*  --   HGB 12.0* 11.5*  HCT 35.4* 33.2*  MCV 84.1 82.8  PLT 304 654   Basic Metabolic Panel: Recent Labs  Lab 02/18/20 2125 02/20/20 0318  NA 136 135  K 4.1 4.4  CL 102 103  CO2 23 20*  GLUCOSE 125* 165*  BUN 20 26*  CREATININE 2.02* 1.46*  CALCIUM 8.9 9.0  MG  --  1.5*  PHOS  --  3.3   GFR: Estimated Creatinine Clearance: 54.6 mL/min (A) (by C-G formula based on SCr of 1.46 mg/dL (H)). Liver Function Tests: Recent Labs  Lab 02/18/20 2125 02/20/20 0318  AST 17 19  ALT 20 25  ALKPHOS 78 75  BILITOT 0.2* 0.3  PROT 7.2 6.6  ALBUMIN 3.6 3.2*   No results for input(s): LIPASE, AMYLASE in the last 168 hours. No results for input(s): AMMONIA in the last 168 hours. Coagulation Profile: Recent Labs  Lab 02/20/20 0318  INR 1.1   Cardiac Enzymes: No results for input(s): CKTOTAL, CKMB, CKMBINDEX, TROPONINI in the last 168 hours. BNP (last 3 results) No results for input(s): PROBNP in the last 8760 hours. HbA1C: No results for input(s): HGBA1C in the last 72 hours. CBG: No results for input(s): GLUCAP in the last 168 hours. Lipid Profile: No results for input(s): CHOL, HDL, LDLCALC, TRIG, CHOLHDL, LDLDIRECT in the last 72 hours. Thyroid Function Tests: No results for input(s): TSH, T4TOTAL, FREET4, T3FREE, THYROIDAB in the last 72 hours. Anemia Panel: No results for input(s): VITAMINB12, FOLATE, FERRITIN, TIBC, IRON, RETICCTPCT in the last 72 hours. Sepsis Labs: Recent Labs  Lab 02/18/20 2234 02/20/20 0318  PROCALCITON  --  <0.10  LATICACIDVEN 1.2  --     Recent Results (from the past 240 hour(s))  Blood culture (routine x 2)     Status: None (Preliminary result)   Collection Time: 02/18/20 10:25 PM   Specimen: BLOOD  Result Value Ref Range Status   Specimen Description BLOOD LEFT ANTECUBITAL  Final    Special Requests   Final    BOTTLES DRAWN AEROBIC AND ANAEROBIC Blood Culture adequate volume Performed at [facility], [place] [address], [place], [place] [address]    Culture PENDING  Incomplete   Report Status PENDING  Incomplete  Blood culture (routine x 2)     Status: None (Preliminary result)   Collection Time: 02/18/20 10:30 PM   Specimen: BLOOD  Result Value Ref Range Status   Specimen Description BLOOD BLOOD RIGHT FOREARM  Final   Special Requests   Final    BOTTLES DRAWN AEROBIC AND ANAEROBIC Blood Culture adequate volume Performed at [facility], [place] [address], [place], [place] [address]    Culture PENDING  Incomplete   Report Status  PENDING  Incomplete  SARS CORONAVIRUS 2 (TAT 6-24 HRS)     Status: None   Collection Time: 02/20/20  2:24 AM  Result Value Ref Range Status   SARS Coronavirus 2 NEGATIVE NEGATIVE Final    Comment: (NOTE) SARS-CoV-2 target nucleic acids are NOT DETECTED.  The SARS-CoV-2 RNA is generally detectable in upper and lower respiratory specimens during the acute phase of infection. Negative results do not preclude SARS-CoV-2 infection, do not rule out co-infections with other pathogens, and should not be used as the sole basis for treatment or other patient management decisions. Negative results must be combined with clinical observations, patient history, and epidemiological information. The expected result is Negative.  Fact Sheet for Patients: SugarRoll.be  Fact Sheet for Healthcare Providers: https://www.woods-mathews.com/  This test is not yet approved or cleared by the [place] FDA and  has been authorized for detection and/or diagnosis of SARS-CoV-2 by FDA under an Emergency Use Authorization (EUA). This EUA will remain  in effect (meaning this test can be used) for the duration of the COVID-19 declaration under Se ction 564(b)(1) of the Act, 21 U.S.C. section 360bbb-3(b)(1),  unless the authorization is terminated or revoked sooner.  Performed at [facility], [place] [address] [place], [place] [address]          Radiology Studies: CT CHEST WO CONTRAST  Result Date: 02/18/2020 CLINICAL DATA:  Chest pain, dyspnea, COVID positive EXAM: CT CHEST WITHOUT CONTRAST TECHNIQUE: Multidetector CT imaging of the chest was performed following the standard protocol without IV contrast. COMPARISON:  None. FINDINGS: Cardiovascular: Mild coronary artery calcification. Global cardiac size within normal limits. Trace pericardial effusion. The central pulmonary arteries are of normal caliber. Mild atherosclerotic calcification within the thoracic aorta. No aortic aneurysm. Mediastinum/Nodes: Pathologic right paratracheal and aortopulmonary lymphadenopathy is present with the index lymph node seen within the right paratracheal region measuring 2.0 x 3.1 cm at axial image # 57. The esophagus is unremarkable. Thyroid unremarkable. Lungs/Pleura: Severe, apically predominant centrilobular emphysema. The irregular opacity noted within the left apex on prior chest radiograph corresponds to a oblong cavitary mass measuring 2.1 x 2.1 x 3.7 cm in greatest dimension on axial image # 30 and coronal image # 64. The lesion demonstrates a a mildly irregular soft tissue rind and a cavitary neoplasm is difficult to exclude. Alternatively, this may represent the residua of a cavitary infectious process. No superimposed focal pulmonary infiltrate is identified. There are scattered throughout the lungs in a random distribution demonstrating a lower lung zone predominance at least 30 pulmonary nodules measuring up to 15 mm within the left upper lobe at axial image # 61. These are suspicious for pulmonary metastases in these may represent metastatic disease from an unknown primary versus is intra pulmonary metastases secondary to a primary pulmonary malignancy. No pneumothorax or pleural effusion. The central  airways are widely patent. Upper Abdomen: A surgical staple line is seen inferior to the right adrenal gland which may reflect surgical changes of a right nephrectomy, though this is not definitively identified on this examination. No acute abnormality within the upper abdomen. Musculoskeletal: No lytic or blastic bone lesions are identified. IMPRESSION: Left upper lobe opacity corresponds to a cavitary mass within the left upper lobe possibly representing a post inflammatory cavity or, less likely, a cavitary neoplasm. Innumerable pulmonary nodules distributed randomly with a lower lobe predominance most in keeping with pulmonary metastatic disease. Pathologic mediastinal adenopathy also identified. Severe centrilobular emphysema. Aortic Atherosclerosis (ICD10-I70.0). Electronically Signed  By: Fidela Salisbury MD   On: 02/18/2020 22:42   DG Chest Port 1 View  Result Date: 02/18/2020 CLINICAL DATA:  Chest pain and shortness of breath since positive COVID test from 2 weeks ago. EXAM: PORTABLE CHEST 1 VIEW COMPARISON:  07/18/2017 FINDINGS: Heart size and pulmonary vascularity are normal. Emphysematous changes in the lungs. Scattered fibrosis. There is suggestion of a developing cavitary lesion in the left apex measuring 2.4 cm diameter. CT suggested for further evaluation. No pleural effusions. No pneumothorax. Mediastinal contours appear intact. IMPRESSION: 1. Suggestion of developing cavitary lesion in the left apex. CT suggested for further evaluation. 2. Emphysematous changes and fibrosis in the lungs. Electronically Signed   By: Lucienne Capers M.D.   On: 02/18/2020 21:15        Scheduled Meds: . vitamin C  500 mg Oral Daily  . enoxaparin (LOVENOX) injection  40 mg Subcutaneous Q24H  . [START ON 02/21/2020] influenza vaccine adjuvanted  0.5 mL Intramuscular Tomorrow-1000  . melatonin  5 mg Oral QHS  . zinc sulfate  220 mg Oral Daily   Continuous Infusions: . sodium chloride Stopped (02/19/20  1718)  . ceFEPime (MAXIPIME) IV 2 g (02/20/20 0133)  . magnesium sulfate bolus IVPB    . vancomycin Stopped (02/19/20 1718)     LOS: 0 days    Time spent: 35 minutes    Emonni Depasquale A Gayanne Prescott, MD [facility]   If 7PM-7AM, please contact night-coverage [email or web address]  02/20/2020, 11:17 AM

## 2020-02-21 ENCOUNTER — Encounter (HOSPITAL_COMMUNITY): Payer: Self-pay | Admitting: Family Medicine

## 2020-02-21 ENCOUNTER — Inpatient Hospital Stay (HOSPITAL_COMMUNITY): Payer: Medicare Other

## 2020-02-21 ENCOUNTER — Inpatient Hospital Stay (HOSPITAL_COMMUNITY): Payer: Medicare Other | Admitting: Anesthesiology

## 2020-02-21 ENCOUNTER — Encounter (HOSPITAL_COMMUNITY)
Admission: EM | Disposition: A | Discharge status: Home / Self Care | Payer: Self-pay | Source: Home / Self Care | Attending: Internal Medicine

## 2020-02-21 DIAGNOSIS — J432 Centrilobular emphysema: Secondary | ICD-10-CM | POA: Diagnosis not present

## 2020-02-21 DIAGNOSIS — Z7952 Long term (current) use of systemic steroids: Secondary | ICD-10-CM

## 2020-02-21 DIAGNOSIS — R519 Headache, unspecified: Secondary | ICD-10-CM

## 2020-02-21 DIAGNOSIS — R590 Localized enlarged lymph nodes: Secondary | ICD-10-CM

## 2020-02-21 DIAGNOSIS — N179 Acute kidney failure, unspecified: Secondary | ICD-10-CM | POA: Diagnosis not present

## 2020-02-21 DIAGNOSIS — R911 Solitary pulmonary nodule: Secondary | ICD-10-CM

## 2020-02-21 DIAGNOSIS — J984 Other disorders of lung: Secondary | ICD-10-CM | POA: Diagnosis not present

## 2020-02-21 DIAGNOSIS — R918 Other nonspecific abnormal finding of lung field: Secondary | ICD-10-CM | POA: Diagnosis not present

## 2020-02-21 DIAGNOSIS — Z9889 Other specified postprocedural states: Secondary | ICD-10-CM

## 2020-02-21 HISTORY — PX: ENDOBRONCHIAL ULTRASOUND: SHX5096

## 2020-02-21 HISTORY — PX: BRONCHIAL NEEDLE ASPIRATION BIOPSY: SHX5106

## 2020-02-21 HISTORY — PX: BRONCHIAL WASHINGS: SHX5105

## 2020-02-21 HISTORY — PX: BRONCHIAL BRUSHINGS: SHX5108

## 2020-02-21 HISTORY — PX: VIDEO BRONCHOSCOPY: SHX5072

## 2020-02-21 LAB — CBC WITH DIFFERENTIAL/PLATELET
Abs Immature Granulocytes: 0.14 10*3/uL — ABNORMAL HIGH (ref 0.00–0.07)
Basophils Absolute: 0 10*3/uL (ref 0.0–0.1)
Basophils Relative: 0 %
Eosinophils Absolute: 0 10*3/uL (ref 0.0–0.5)
Eosinophils Relative: 0 %
HCT: 38.4 % — ABNORMAL LOW (ref 39.0–52.0)
Hemoglobin: 12.5 g/dL — ABNORMAL LOW (ref 13.0–17.0)
Immature Granulocytes: 1 %
Lymphocytes Relative: 4 %
Lymphs Abs: 0.5 10*3/uL — ABNORMAL LOW (ref 0.7–4.0)
MCH: 27.4 pg (ref 26.0–34.0)
MCHC: 32.6 g/dL (ref 30.0–36.0)
MCV: 84 fL (ref 80.0–100.0)
Monocytes Absolute: 0.5 10*3/uL (ref 0.1–1.0)
Monocytes Relative: 4 %
Neutro Abs: 13 10*3/uL — ABNORMAL HIGH (ref 1.7–7.7)
Neutrophils Relative %: 91 %
Platelets: 361 10*3/uL (ref 150–400)
RBC: 4.57 MIL/uL (ref 4.22–5.81)
RDW: 12.3 % (ref 11.5–15.5)
WBC: 14.2 10*3/uL — ABNORMAL HIGH (ref 4.0–10.5)
nRBC: 0 % (ref 0.0–0.2)

## 2020-02-21 LAB — MAGNESIUM: Magnesium: 1.9 mg/dL (ref 1.7–2.4)

## 2020-02-21 LAB — VITAMIN B12: Vitamin B-12: 782 pg/mL (ref 180–914)

## 2020-02-21 LAB — COMPREHENSIVE METABOLIC PANEL
ALT: 28 U/L (ref 0–44)
AST: 18 U/L (ref 15–41)
Albumin: 3.4 g/dL — ABNORMAL LOW (ref 3.5–5.0)
Alkaline Phosphatase: 76 U/L (ref 38–126)
Anion gap: 12 (ref 5–15)
BUN: 27 mg/dL — ABNORMAL HIGH (ref 8–23)
CO2: 20 mmol/L — ABNORMAL LOW (ref 22–32)
Calcium: 9.2 mg/dL (ref 8.9–10.3)
Chloride: 102 mmol/L (ref 98–111)
Creatinine, Ser: 1.43 mg/dL — ABNORMAL HIGH (ref 0.61–1.24)
GFR, Estimated: 54 mL/min — ABNORMAL LOW (ref >60)
Glucose, Bld: 158 mg/dL — ABNORMAL HIGH (ref 70–99)
Potassium: 4.4 mmol/L (ref 3.5–5.1)
Sodium: 134 mmol/L — ABNORMAL LOW (ref 135–145)
Total Bilirubin: 0.8 mg/dL (ref 0.3–1.2)
Total Protein: 7.1 g/dL (ref 6.5–8.1)

## 2020-02-21 LAB — IRON AND TIBC
Iron: 111 ug/dL (ref 45–182)
Saturation Ratios: 30 % (ref 17.9–39.5)
TIBC: 364 ug/dL (ref 250–450)
UIBC: 253 ug/dL

## 2020-02-21 LAB — FERRITIN: Ferritin: 200 ng/mL (ref 24–336)

## 2020-02-21 LAB — MISC LABCORP TEST (SEND OUT): Labcorp test code: 990010

## 2020-02-21 LAB — RETICULOCYTES
Immature Retic Fract: 6 % (ref 2.3–15.9)
RBC.: 4.5 MIL/uL (ref 4.22–5.81)
Retic Count, Absolute: 80.6 10*3/uL (ref 19.0–186.0)
Retic Ct Pct: 1.8 % (ref 0.4–3.1)

## 2020-02-21 LAB — FOLATE: Folate: 3.4 ng/mL — ABNORMAL LOW (ref >5.9)

## 2020-02-21 LAB — C-REACTIVE PROTEIN: CRP: 0.7 mg/dL (ref <1.0)

## 2020-02-21 LAB — PHOSPHORUS: Phosphorus: 3.4 mg/dL (ref 2.5–4.6)

## 2020-02-21 LAB — D-DIMER, QUANTITATIVE: D-Dimer, Quant: 0.74 ug/mL-FEU — ABNORMAL HIGH (ref 0.00–0.50)

## 2020-02-21 SURGERY — BRONCHOSCOPY, WITH FLUOROSCOPY
Anesthesia: General

## 2020-02-21 MED ORDER — SUGAMMADEX SODIUM 200 MG/2ML IV SOLN
INTRAVENOUS | Status: DC | PRN
  Administered 2020-02-21: 100 mg via INTRAVENOUS
  Administered 2020-02-21: 200 mg via INTRAVENOUS
  Administered 2020-02-21: 100 mg via INTRAVENOUS

## 2020-02-21 MED ORDER — ONDANSETRON HCL 4 MG/2ML IJ SOLN
INTRAMUSCULAR | Status: DC | PRN
  Administered 2020-02-21: 4 mg via INTRAVENOUS

## 2020-02-21 MED ORDER — HYDRALAZINE HCL 20 MG/ML IJ SOLN
10.0000 mg | Freq: Three times a day (TID) | INTRAMUSCULAR | Status: DC | PRN
  Administered 2020-02-22: 10 mg via INTRAVENOUS
  Filled 2020-02-21 (×2): qty 1

## 2020-02-21 MED ORDER — ESMOLOL HCL 100 MG/10ML IV SOLN
INTRAVENOUS | Status: DC | PRN
  Administered 2020-02-21 (×3): 20 mg via INTRAVENOUS

## 2020-02-21 MED ORDER — HYDRALAZINE HCL 20 MG/ML IJ SOLN
10.0000 mg | Freq: Three times a day (TID) | INTRAMUSCULAR | Status: DC | PRN
  Administered 2020-02-21: 10 mg via INTRAVENOUS
  Filled 2020-02-21: qty 1

## 2020-02-21 MED ORDER — AMLODIPINE BESYLATE 5 MG PO TABS
5.0000 mg | ORAL_TABLET | Freq: Every day | ORAL | Status: DC
  Administered 2020-02-22: 5 mg via ORAL
  Filled 2020-02-21: qty 1

## 2020-02-21 MED ORDER — LACTATED RINGERS IV SOLN: INTRAVENOUS | Status: DC | PRN

## 2020-02-21 MED ORDER — PHENYLEPHRINE 40 MCG/ML (10ML) SYRINGE FOR IV PUSH (FOR BLOOD PRESSURE SUPPORT)
PREFILLED_SYRINGE | INTRAVENOUS | Status: DC | PRN
  Administered 2020-02-21: 80 ug via INTRAVENOUS
  Administered 2020-02-21: 40 ug via INTRAVENOUS

## 2020-02-21 MED ORDER — PROPOFOL 10 MG/ML IV BOLUS
INTRAVENOUS | Status: DC | PRN
  Administered 2020-02-21: 40 mg via INTRAVENOUS
  Administered 2020-02-21: 150 mg via INTRAVENOUS

## 2020-02-21 MED ORDER — FENTANYL CITRATE (PF) 250 MCG/5ML IJ SOLN
INTRAMUSCULAR | Status: DC | PRN
  Administered 2020-02-21 (×2): 50 ug via INTRAVENOUS

## 2020-02-21 MED ORDER — FOLIC ACID 1 MG PO TABS
1.0000 mg | ORAL_TABLET | Freq: Every day | ORAL | Status: DC
  Administered 2020-02-21 – 2020-02-25 (×5): 1 mg via ORAL
  Filled 2020-02-21 (×5): qty 1

## 2020-02-21 MED ORDER — ALBUTEROL SULFATE HFA 108 (90 BASE) MCG/ACT IN AERS
INHALATION_SPRAY | RESPIRATORY_TRACT | Status: DC | PRN
  Administered 2020-02-21: 2 via RESPIRATORY_TRACT

## 2020-02-21 MED ORDER — DEXAMETHASONE SODIUM PHOSPHATE 10 MG/ML IJ SOLN
INTRAMUSCULAR | Status: DC | PRN
  Administered 2020-02-21: 5 mg via INTRAVENOUS

## 2020-02-21 MED ORDER — PHENYLEPHRINE HCL-NACL 10-0.9 MG/250ML-% IV SOLN
INTRAVENOUS | Status: DC | PRN
  Administered 2020-02-21: 50 ug/min via INTRAVENOUS

## 2020-02-21 MED ORDER — GADOBUTROL 1 MMOL/ML IV SOLN
8.0000 mL | Freq: Once | INTRAVENOUS | Status: AC | PRN
  Administered 2020-02-21: 8 mL via INTRAVENOUS

## 2020-02-21 MED ORDER — ROCURONIUM BROMIDE 100 MG/10ML IV SOLN
INTRAVENOUS | Status: DC | PRN
  Administered 2020-02-21: 60 mg via INTRAVENOUS
  Administered 2020-02-21: 10 mg via INTRAVENOUS

## 2020-02-21 MED ORDER — SUCCINYLCHOLINE CHLORIDE 20 MG/ML IJ SOLN
INTRAMUSCULAR | Status: DC | PRN
  Administered 2020-02-21: 120 mg via INTRAVENOUS

## 2020-02-21 MED ORDER — ALBUTEROL SULFATE (2.5 MG/3ML) 0.083% IN NEBU
INHALATION_SOLUTION | RESPIRATORY_TRACT | Status: AC
  Filled 2020-02-21: qty 3

## 2020-02-21 MED ORDER — LIDOCAINE 2% (20 MG/ML) 5 ML SYRINGE
INTRAMUSCULAR | Status: DC | PRN
  Administered 2020-02-21: 80 mg via INTRAVENOUS

## 2020-02-21 NOTE — Anesthesia Postprocedure Evaluation (Signed)
Anesthesia Post Note  Patient: Corey Dixon  Procedure(s) Performed: VIDEO BRONCHOSCOPY WITH FLUORO (N/A ) ENDOBRONCHIAL ULTRASOUND (N/A ) BRONCHIAL BRUSHINGS BRONCHIAL WASHINGS BRONCHIAL NEEDLE ASPIRATION BIOPSIES     Patient location during evaluation: Endoscopy Anesthesia Type: General Level of consciousness: awake and alert Pain management: pain level controlled Vital Signs Assessment: post-procedure vital signs reviewed and stable Respiratory status: spontaneous breathing, nonlabored ventilation and respiratory function stable Cardiovascular status: blood pressure returned to baseline and stable Postop Assessment: no apparent nausea or vomiting Anesthetic complications: no   No complications documented.  Last Vitals:  Vitals:   02/21/20 1626 02/21/20 2016  BP: (!) 131/93 (!) 144/81  Pulse: 95 (!) 102  Resp: 17 18  Temp: 36.7 C 36.6 C  SpO2: 93% 93%    Last Pain:  Vitals:   02/21/20 2016  TempSrc: Oral  PainSc: 4                  Catalina Gravel

## 2020-02-21 NOTE — Progress Notes (Signed)
PROGRESS NOTE    Corey Dixon  ZOX:[id number] DOB: February 23, 1955 DOA: 02/18/2020 PCP: Arman Bogus., MD   Brief Narrative: 65 year old with past medical history significant for COPD, GERD, bladder cancer status post BCG who presented to [facility] due to increased work of breathing, chest pain that has been going on since his diagnosis of Covid on 1/10.  Patient had nausea and vomiting initially with the  diagnosis of Covid.  Subsequently that resolved.  He reports 10 pounds weight loss since his symptoms are started.  With worsening increased work of breathing.  Evaluation in the ED creatinine of 2.0, lactic acid 1.2.  Chest x-ray was suggestive of developing cavitary lesion in the left apex.  CT chest without contrast showed left upper lobe opacity which corresponds to a cavitary mass within left upper lobe possible representing postinflammatory cavitary lesion or a cavitary neoplasm.  AFB sputum ordered.  ID and pulmonologist has been consulted     Assessment & Plan:   Principal Problem:   Cavitary lesion of lung Active Problems:   COVID-19 virus infection   AKI (acute kidney injury) ([place] [employer])   COPD (chronic obstructive pulmonary disease) ([facility])   History of bladder cancer   Pulmonary nodules/lesions, multiple   Mediastinal lymphadenopathy  1-Cavitary lung mass; Multiples Pulmonary nodule; Infectious vs neoplasm.  ID consulted, recommend to stop antibiotics, AFB sputum and pulmonary consultation.  Recent covid infection 10 days ago. PCR in hospital negative.  Follow: Cryptococcal antigen negative , histoplasma antigen, Legionella antigen,coccidiodes, Blastomyces antigen, acid-fast smear.  Underwent Bronchoscopy 1/26.  2-COPD; continue with inhaler.   3-AKI: Improved with IV fluids Cr peak to 2.0. Cr down to 1.4  4-History of bladder cancer, S.P CBG.   5-Hypomagnesemia; replaced.  6-Anemia;  Folic acid low. Start supplement.  Iron and B 12 normal.   7-HTN; start Norvasc.  PRN hydralazine.  8-Headache; MRI;  Mild hyponatremia.   Estimated body mass index is 24.49 kg/m as calculated from the following:   Height as of this encounter: 6' (1.829 m).   Weight as of this encounter: 81.9 kg.   DVT prophylaxis: Lovenox Code Status: Full Code Family Communication: care discussed with patient.  Disposition Plan:  Status is: Inpatient  Remains inpatient appropriate because:Ongoing diagnostic testing needed not appropriate for outpatient work up   Dispo: The patient is from: Home              Anticipated d/c is to: Home              Anticipated d/c date is: 3 days              Patient currently is not medically stable to d/c.   Difficult to place patient No        Consultants:   Pulmonology  ID  Procedures:   None  Antimicrobials:    Subjective: He denies dyspnea, chest pain   Objective: Vitals:   02/21/20 1347 02/21/20 1535 02/21/20 1543 02/21/20 1553  BP: (!) 154/101 (!) 109/95 (!) 128/99 (!) 146/93  Pulse: 91 (!) 105 (!) 112 99  Resp: 15 20 20 20   Temp: 98.1 F (36.7 C) (!) 97.1 F (36.2 C)    TempSrc: Temporal Temporal    SpO2: 95% 97% 95% 94%  Weight: 81.9 kg     Height: 6' (1.829 m)       Intake/Output Summary (Last 24 hours) at 02/21/2020 1607 Last data filed at 02/21/2020 1518 Gross per 24 hour  Intake 1040 ml  Output 15 ml  Net 1025 ml   Filed Weights   02/18/20 2027 02/20/20 0025 02/21/20 1347  Weight: 83.9 kg 81.9 kg 81.9 kg    Examination:  General exam: NAD Respiratory system: CTA Cardiovascular system:  S 1, S 2 RRR Gastrointestinal system: BS present, soft, nt Central nervous system: alert.  Extremities: no edema   Data Reviewed: I have personally reviewed following labs and imaging studies  CBC: Recent Labs  Lab 02/18/20 2125 02/20/20 0318 02/21/20 0747  WBC 9.5 13.4* 14.2*  NEUTROABS 7.8*  --  13.0*  HGB 12.0* 11.5* 12.5*  HCT 35.4* 33.2* 38.4*  MCV 84.1 82.8 84.0  PLT 304 298 007    Basic Metabolic Panel: Recent Labs  Lab 02/18/20 2125 02/20/20 0318 02/21/20 0747  NA 136 135 134*  K 4.1 4.4 4.4  CL 102 103 102  CO2 23 20* 20*  GLUCOSE 125* 165* 158*  BUN 20 26* 27*  CREATININE 2.02* 1.46* 1.43*  CALCIUM 8.9 9.0 9.2  MG  --  1.5* 1.9  PHOS  --  3.3 3.4   GFR: Estimated Creatinine Clearance: 55.8 mL/min (A) (by C-G formula based on SCr of 1.43 mg/dL (H)). Liver Function Tests: Recent Labs  Lab 02/18/20 2125 02/20/20 0318 02/21/20 0747  AST 17 19 18   ALT 20 25 28   ALKPHOS 78 75 76  BILITOT 0.2* 0.3 0.8  PROT 7.2 6.6 7.1  ALBUMIN 3.6 3.2* 3.4*   No results for input(s): LIPASE, AMYLASE in the last 168 hours. No results for input(s): AMMONIA in the last 168 hours. Coagulation Profile: Recent Labs  Lab 02/20/20 0318  INR 1.1   Cardiac Enzymes: No results for input(s): CKTOTAL, CKMB, CKMBINDEX, TROPONINI in the last 168 hours. BNP (last 3 results) No results for input(s): PROBNP in the last 8760 hours. HbA1C: No results for input(s): HGBA1C in the last 72 hours. CBG: No results for input(s): GLUCAP in the last 168 hours. Lipid Profile: No results for input(s): CHOL, HDL, LDLCALC, TRIG, CHOLHDL, LDLDIRECT in the last 72 hours. Thyroid Function Tests: No results for input(s): TSH, T4TOTAL, FREET4, T3FREE, THYROIDAB in the last 72 hours. Anemia Panel: Recent Labs    02/21/20 0747  VITAMINB12 782  FOLATE 3.4*  FERRITIN 200  TIBC 364  IRON 111  RETICCTPCT 1.8   Sepsis Labs: Recent Labs  Lab 02/18/20 2234 02/20/20 0318  PROCALCITON  --  <0.10  LATICACIDVEN 1.2  --     Recent Results (from the past 240 hour(s))  Blood culture (routine x 2)     Status: None (Preliminary result)   Collection Time: 02/18/20 10:25 PM   Specimen: BLOOD  Result Value Ref Range Status   Specimen Description BLOOD LEFT ANTECUBITAL  Final   Special Requests   Final    BOTTLES DRAWN AEROBIC AND ANAEROBIC Blood Culture adequate volume   Culture   Final     NO GROWTH 2 DAYS Performed at [facility], [place] [address] [place], [place] [address]    Report Status PENDING  Incomplete  Blood culture (routine x 2)     Status: None (Preliminary result)   Collection Time: 02/18/20 10:30 PM   Specimen: BLOOD  Result Value Ref Range Status   Specimen Description BLOOD BLOOD RIGHT FOREARM  Final   Special Requests   Final    BOTTLES DRAWN AEROBIC AND ANAEROBIC Blood Culture adequate volume   Culture   Final    NO GROWTH 2 DAYS Performed at  [facility], [place] [address], [place], [place] [address]    Report Status PENDING  Incomplete  SARS CORONAVIRUS 2 (TAT 6-24 HRS)     Status: None   Collection Time: 02/20/20  2:24 AM  Result Value Ref Range Status   SARS Coronavirus 2 NEGATIVE NEGATIVE Final    Comment: (NOTE) SARS-CoV-2 target nucleic acids are NOT DETECTED.  The SARS-CoV-2 RNA is generally detectable in upper and lower respiratory specimens during the acute phase of infection. Negative results do not preclude SARS-CoV-2 infection, do not rule out co-infections with other pathogens, and should not be used as the sole basis for treatment or other patient management decisions. Negative results must be combined with clinical observations, patient history, and epidemiological information. The expected result is Negative.  Fact Sheet for Patients: SugarRoll.be  Fact Sheet for Healthcare Providers: https://www.woods-mathews.com/  This test is not yet approved or cleared by the [place] FDA and  has been authorized for detection and/or diagnosis of SARS-CoV-2 by FDA under an Emergency Use Authorization (EUA). This EUA will remain  in effect (meaning this test can be used) for the duration of the COVID-19 declaration under Se ction 564(b)(1) of the Act, 21 U.S.C. section 360bbb-3(b)(1), unless the authorization is terminated or revoked sooner.  Performed at [place] [facility], [place] [address] [place], [place] [address]   MRSA PCR Screening     Status: None   Collection Time: 02/20/20  6:46 PM   Specimen: Nasal Mucosa; Nasopharyngeal  Result Value Ref Range Status   MRSA by PCR NEGATIVE NEGATIVE Final    Comment:        The GeneXpert MRSA Assay (FDA approved for NASAL specimens only), is one component of a comprehensive MRSA colonization surveillance program. It is not intended to diagnose MRSA infection nor to guide or monitor treatment for MRSA infections. Performed at [facility], [place] [address], [place], [place] [address]          Radiology Studies: DG CHEST PORT 1 VIEW  Result Date: 02/21/2020 CLINICAL DATA:  History of COVID-19 positivity, recent bronchoscopy EXAM: PORTABLE CHEST 1 VIEW COMPARISON:  02/18/2020 FINDINGS: Cardiac shadow is within normal limits. The lungs are hyperinflated. Cavitary lesion in the left upper lobe laterally is again seen. Known nodules are less well appreciated on this exam. No pneumothorax is noted following bronchoscopy. IMPRESSION: No evidence of post bronchoscopy pneumothorax. Electronically Signed   By: Inez Catalina M.D.   On: 02/21/2020 15:59   DG C-ARM BRONCHOSCOPY  Result Date: 02/21/2020 C-ARM BRONCHOSCOPY: Fluoroscopy was utilized by the requesting physician.  No radiographic interpretation.        Scheduled Meds: . amLODipine  5 mg Oral Daily  . vitamin C  500 mg Oral Daily  . melatonin  5 mg Oral QHS  . methylPREDNISolone (SOLU-MEDROL) injection  40 mg Intravenous Daily  . zinc sulfate  220 mg Oral Daily   Continuous Infusions: . sodium chloride Stopped (02/19/20 1718)     LOS: 1 day    Time spent: 35 minutes    Abhi Moccia A Verlena Marlette, MD [facility]   If 7PM-7AM, please contact night-coverage [email or web address]  02/21/2020, 4:07 PM

## 2020-02-21 NOTE — Progress Notes (Addendum)
NAME:  Corey Dixon, MRN:  [id number], DOB:  November 12, 1955, LOS: 1 ADMISSION DATE:  02/18/2020, CONSULTATION DATE:  02/20/20 REFERRING MD:  Tyrell Antonio  CHIEF COMPLAINT:  Cavitary Lesion, Pulmonary Nodules   Brief History   Corey Dixon is a 65 y.o. male who was admitted 1/25 with dyspnea 2/2 COVID PNA and CT chest demonstrated LUL cavitation for which PCCM was asked to weigh in on.  History of present illness   Corey Dixon is a 65 y.o. male who has a PMH including but not limited to COPD, GERD, bladder CA s/p chemo and right nephrectomy (see "past medical history" for rest).  He presented to [facility] ED 1/23 with dyspnea and increased work of breathing.  He was diagnosed with COVID 1/10 .  He reports excessive fatigue than dyspnea, no wheezing cough has been very mild and has not needed much cough syrup.  He had monitored O2 sats at home and lowest that they got were 96%.  Due to generalized weakness and fatigue, he decided to seek medical attention; hence, he presented to [facility] ED. He reports a 10 pound weight loss in the last week due to vomiting which he thinks may have been related to medication that his PCP gave him  In ED, CXR suggested LUL cavitation; therefore, CT chest was obtained.  This confirmed LUL cavitary lesion along with multiple pulmonary nodules bilaterally.  Per pt, he has has multiple negative TB tests in the past (had to test prior to getting BCG for bladder CA, last test 2019 per report). He is a former smoker. Per Care Everywhere, he has a 46 pack year history and quit in 2017.  Past Medical History  has Cavitary lesion of lung; COVID-19 virus infection; AKI (acute kidney injury) ([place]); COPD (chronic obstructive pulmonary disease) ([place]); History of bladder cancer; and Pulmonary nodules/lesions, multiple on their problem list.  Significant Hospital Events   1/25 > admit.  Consults:  ID, PCCM.  Procedures:  None.  Significant Diagnostic Tests:  CT chest 1/23 > LUL  cavitary mass.  Innumerable pulmonary nodules with lower lobe predominance, mediastinal adenopathy, severe centrilobular emphysema.  Micro Data:  Blood 1/23 >  AFB 1/25 >  COVID 1/25 > neg COVID1/10 (per report) > positive.  Antimicrobials:  Vanc 1/23 > 1/25 Cefepime 1/23 > 1/25    Interim history/subjective:   Complains of headache bifrontal but also going to the occipital area for 2 to 3 weeks Afebrile Breathing okay No coughing Saturation 95% on room air  Objective:  Blood pressure (!) 176/94, pulse 77, temperature 97.9 F (36.6 C), resp. rate 16, height 6' (1.829 m), weight 81.9 kg, SpO2 95 %.        Intake/Output Summary (Last 24 hours) at 02/21/2020 0940 Last data filed at 02/21/2020 0900 Gross per 24 hour  Intake 240 ml  Output --  Net 240 ml   Filed Weights   02/18/20 2027 02/20/20 0025  Weight: 83.9 kg 81.9 kg    Examination: Gen. Pleasant, well-nourished, in no distress ENT - no thrush, no pallor/icterus,no post nasal drip Neck: No JVD, no thyromegaly, no carotid bruits Lungs: no use of accessory muscles, no dullness to percussion, clear without rales or rhonchi  Cardiovascular: Rhythm regular, heart sounds  normal, no murmurs or gallops, no peripheral edema Musculoskeletal: No deformities, no cyanosis or clubbing    Labs showed decreased creatinine to 1.43, mild leukocytosis  Assessment & Plan:   LUL cavitary lesion, mediastinal lymphadenopathy and multiple pulmonary  nodules.  He does not have any symptoms to suggest tuberculosis.  He does have post Covid fatigue but surprisingly does not even have infiltrates to suggest Covid pneumonia.  He is vaccinated although not boosted.  Repeat Covid testing by PCR has been negative which is reassuring. I am more concerned about malignancy in this heavy ex-smoker.  I am not concerned about tuberculosis here.  He had BCG installed during bladder carcinoma treatment.  He has undergone multiple negative TB test in  the past  Plan for bronchoscopy/EBUS under general anesthesia today at 2 PM.  We will take brushings from left upper lobe Risks and benefits of the procedure were discussed with the patient and his wife, all questions were answered  Emphysema -no evidence of bronchospasm Continue Solu-Medrol 40 daily  Headache -ongoing for 2 weeks, appears atypical, does not seem Covid related. MRI brain, he will need this anyways for staging   Labs   CBC: Recent Labs  Lab 02/18/20 2125 02/20/20 0318 02/21/20 0747  WBC 9.5 13.4* 14.2*  NEUTROABS 7.8*  --  13.0*  HGB 12.0* 11.5* 12.5*  HCT 35.4* 33.2* 38.4*  MCV 84.1 82.8 84.0  PLT 304 298 342   Basic Metabolic Panel: Recent Labs  Lab 02/18/20 2125 02/20/20 0318 02/21/20 0747  NA 136 135 134*  K 4.1 4.4 4.4  CL 102 103 102  CO2 23 20* 20*  GLUCOSE 125* 165* 158*  BUN 20 26* 27*  CREATININE 2.02* 1.46* 1.43*  CALCIUM 8.9 9.0 9.2  MG  --  1.5* 1.9  PHOS  --  3.3 3.4   GFR: Estimated Creatinine Clearance: 55.8 mL/min (A) (by C-G formula based on SCr of 1.43 mg/dL (H)). Recent Labs  Lab 02/18/20 2125 02/18/20 2234 02/20/20 0318 02/21/20 0747  PROCALCITON  --   --  <0.10  --   WBC 9.5  --  13.4* 14.2*  LATICACIDVEN  --  1.2  --   --    Liver Function Tests: Recent Labs  Lab 02/18/20 2125 02/20/20 0318 02/21/20 0747  AST 17 19 18   ALT 20 25 28   ALKPHOS 78 75 76  BILITOT 0.2* 0.3 0.8  PROT 7.2 6.6 7.1  ALBUMIN 3.6 3.2* 3.4*   No results for input(s): LIPASE, AMYLASE in the last 168 hours. No results for input(s): AMMONIA in the last 168 hours. ABG No results found for: PHART, PCO2ART, PO2ART, HCO3, TCO2, ACIDBASEDEF, O2SAT  Coagulation Profile: Recent Labs  Lab 02/20/20 0318  INR 1.1   Kara Mead MD. [facility]. [place] [facility] See Amion for pager  If no response to pager , please call [phone]  After 7:00 pm call Elink  [phone]   02/21/2020

## 2020-02-21 NOTE — Anesthesia Preprocedure Evaluation (Addendum)
Anesthesia Evaluation  Patient identified by MRN, date of birth, ID band Patient awake    Reviewed: Allergy & Precautions, NPO status , Patient's Chart, lab work & pertinent test results  Airway Mallampati: II  TM Distance: >3 FB Neck ROM: Full    Dental  (+) Dental Advisory Given, Caps   Pulmonary COPD, former smoker,  LUL cancer   Pulmonary exam normal breath sounds clear to auscultation       Cardiovascular negative cardio ROS Normal cardiovascular exam Rhythm:Regular Rate:Normal     Neuro/Psych negative neurological ROS  negative psych ROS   GI/Hepatic Neg liver ROS, GERD  Medicated,  Endo/Other  negative endocrine ROS  Renal/GU Renal hypertension and Renal InsufficiencyRenal disease   Bladder cancer     Musculoskeletal negative musculoskeletal ROS (+)   Abdominal   Peds  Hematology  (+) Blood dyscrasia, anemia ,   Anesthesia Other Findings Day of surgery medications reviewed with the patient.  Reproductive/Obstetrics                            Anesthesia Physical Anesthesia Plan  ASA: III  Anesthesia Plan: General   Post-op Pain Management:    Induction: Intravenous  PONV Risk Score and Plan: 2 and Dexamethasone and Ondansetron  Airway Management Planned: Oral ETT  Additional Equipment:   Intra-op Plan:   Post-operative Plan: Extubation in OR  Informed Consent: I have reviewed the patients History and Physical, chart, labs and discussed the procedure including the risks, benefits and alternatives for the proposed anesthesia with the patient or authorized representative who has indicated his/her understanding and acceptance.     Dental advisory given  Plan Discussed with: CRNA  Anesthesia Plan Comments:         Anesthesia Quick Evaluation

## 2020-02-21 NOTE — Plan of Care (Signed)

## 2020-02-21 NOTE — Progress Notes (Signed)
   02/21/20 0539  Provider Notification  Provider Name/Title Dr Sidney Ace  Date Provider Notified 02/21/20  Time Provider Notified [phone]  Notification Type Call  Notification Reason Other (Comment) (is it okay to give lovenox with pt going for bronchoscopy today?)  Response Other (Comment) (To hold up and wait for Pulmonologist response.)  Date of Provider Response 02/21/20  Time of Provider Response [phone]

## 2020-02-21 NOTE — Transfer of Care (Signed)
Immediate Anesthesia Transfer of Care Note  Patient: Corey Dixon  Procedure(s) Performed: VIDEO BRONCHOSCOPY WITH FLUORO (N/A ) ENDOBRONCHIAL ULTRASOUND (N/A ) BRONCHIAL BRUSHINGS BRONCHIAL WASHINGS BRONCHIAL NEEDLE ASPIRATION BIOPSIES  Patient Location: Endoscopy Unit  Anesthesia Type:General  Level of Consciousness: awake and patient cooperative  Airway & Oxygen Therapy: Patient Spontanous Breathing and Patient connected to face mask oxygen  Post-op Assessment: Report given to RN and Post -op Vital signs reviewed and stable  Post vital signs: Reviewed  Last Vitals:  Vitals Value Taken Time  BP 109/95   Temp 97.1   Pulse 105   Resp 14   SpO2 96     Last Pain:  Vitals:   02/21/20 1347  TempSrc: Temporal  PainSc: 0-No pain         Complications: No complications documented.

## 2020-02-21 NOTE — Progress Notes (Signed)
Subjective: No new complaints. Patient continues to be afebrile and states his shortness of breath and chest pain has improved. He does continue to endorse bifrontal headache that started about a month ago and has not improved with Tylenol. He endorses feeling foggy in the head but denies any blurry vision, nausea or vomiting. States he has recently noticed that his blood pressure has been extremely elevated since he was seen at the med center.  His sBP is usually in the 120s-130s.   Antibiotics:  Anti-infectives (From admission, onward)   Start     Dose/Rate Route Frequency Ordered Stop   02/19/20 1330  vancomycin (VANCOCIN) IVPB 1000 mg/200 mL premix  Status:  Discontinued        1,000 mg 200 mL/hr over 60 Minutes Intravenous Every 24 hours 02/19/20 1316 02/20/20 1558   02/19/20 1330  ceFEPIme (MAXIPIME) 2 g in sodium chloride 0.9 % 100 mL IVPB  Status:  Discontinued        2 g 200 mL/hr over 30 Minutes Intravenous Every 12 hours 02/19/20 1316 02/20/20 1558   02/18/20 2315  ceFEPIme (MAXIPIME) 2 g in sodium chloride 0.9 % 100 mL IVPB        2 g 200 mL/hr over 30 Minutes Intravenous  Once 02/18/20 2307 02/18/20 2356   02/18/20 2300  vancomycin (VANCOCIN) IVPB 1000 mg/200 mL premix        1,000 mg 200 mL/hr over 60 Minutes Intravenous  Once 02/18/20 2256 02/19/20 0243   02/18/20 2300  ceFEPIme (MAXIPIME) 1 g in sodium chloride 0.9 % 100 mL IVPB  Status:  Discontinued        1 g 200 mL/hr over 30 Minutes Intravenous  Once 02/18/20 2257 02/18/20 2307      Medications: Scheduled Meds: . vitamin C  500 mg Oral Daily  . influenza vaccine adjuvanted  0.5 mL Intramuscular Tomorrow-1000  . melatonin  5 mg Oral QHS  . methylPREDNISolone (SOLU-MEDROL) injection  40 mg Intravenous Daily  . zinc sulfate  220 mg Oral Daily   Continuous Infusions: . sodium chloride Stopped (02/19/20 1718)   PRN Meds:.sodium chloride, acetaminophen, bisacodyl, chlorpheniramine-HYDROcodone,  guaiFENesin-dextromethorphan, ipratropium-albuterol, ondansetron **OR** ondansetron (ZOFRAN) IV    Objective: Weight change:   Intake/Output Summary (Last 24 hours) at 02/21/2020 1016 Last data filed at 02/21/2020 0900 Gross per 24 hour  Intake 240 ml  Output --  Net 240 ml   Blood pressure (!) 176/94, pulse 77, temperature 97.9 F (36.6 C), resp. rate 16, height 6' (1.829 m), weight 81.9 kg, SpO2 95 %. Temp:  [97.9 F (36.6 C)-98 F (36.7 C)] 97.9 F (36.6 C) (01/26 0556) Pulse Rate:  [77-91] 77 (01/26 0556) Resp:  [16-18] 16 (01/26 0556) BP: (137-176)/(90-94) 176/94 (01/26 0556) SpO2:  [94 %-95 %] 95 % (01/26 0556)  Physical Exam: Physical Exam Constitutional:      Appearance: He is well-developed.  Cardiovascular:     Rate and Rhythm: Normal rate and regular rhythm.     Heart sounds: Normal heart sounds.  Pulmonary:     Effort: Pulmonary effort is normal.     Breath sounds: Decreased breath sounds present. No wheezing.  Chest:     Chest wall: No tenderness.  Abdominal:     General: Bowel sounds are normal.     Palpations: Abdomen is soft.  Musculoskeletal:        General: Normal range of motion.     Cervical back: Normal range of  motion.  Skin:    General: Skin is warm and dry.     Capillary Refill: Capillary refill takes less than 2 seconds.  Neurological:     General: No focal deficit present.     Mental Status: He is alert and oriented to person, place, and time.  Psychiatric:        Mood and Affect: Mood normal.      CBC:    Component Value Date/Time   WBC 14.2 (H) 02/21/2020 0747   RBC 4.57 02/21/2020 0747   RBC 4.50 02/21/2020 0747   HGB 12.5 (L) 02/21/2020 0747   HCT 38.4 (L) 02/21/2020 0747   PLT 361 02/21/2020 0747   MCV 84.0 02/21/2020 0747   MCH 27.4 02/21/2020 0747   MCHC 32.6 02/21/2020 0747   RDW 12.3 02/21/2020 0747   LYMPHSABS 0.5 (L) 02/21/2020 0747   MONOABS 0.5 02/21/2020 0747   EOSABS 0.0 02/21/2020 0747   BASOSABS 0.0  02/21/2020 0747      BMET Recent Labs    02/20/20 0318 02/21/20 0747  NA 135 134*  K 4.4 4.4  CL 103 102  CO2 20* 20*  GLUCOSE 165* 158*  BUN 26* 27*  CREATININE 1.46* 1.43*  CALCIUM 9.0 9.2     Liver Panel  Recent Labs    02/20/20 0318 02/21/20 0747  PROT 6.6 7.1  ALBUMIN 3.2* 3.4*  AST 19 18  ALT 25 28  ALKPHOS 75 76  BILITOT 0.3 0.8       Sedimentation Rate No results for input(s): ESRSEDRATE in the last 72 hours. C-Reactive Protein Recent Labs    02/21/20 0747  CRP 0.7    Micro Results: Recent Results (from the past 720 hour(s))  Blood culture (routine x 2)     Status: None (Preliminary result)   Collection Time: 02/18/20 10:25 PM   Specimen: BLOOD  Result Value Ref Range Status   Specimen Description BLOOD LEFT ANTECUBITAL  Final   Special Requests   Final    BOTTLES DRAWN AEROBIC AND ANAEROBIC Blood Culture adequate volume   Culture   Final    NO GROWTH 1 DAY Performed at [facility], [place] [address] [place], [place] [address]    Report Status PENDING  Incomplete  Blood culture (routine x 2)     Status: None (Preliminary result)   Collection Time: 02/18/20 10:30 PM   Specimen: BLOOD  Result Value Ref Range Status   Specimen Description BLOOD BLOOD RIGHT FOREARM  Final   Special Requests   Final    BOTTLES DRAWN AEROBIC AND ANAEROBIC Blood Culture adequate volume   Culture   Final    NO GROWTH 1 DAY Performed at [facility], [place] [address], [place], [place] [address]    Report Status PENDING  Incomplete  SARS CORONAVIRUS 2 (TAT 6-24 HRS)     Status: None   Collection Time: 02/20/20  2:24 AM  Result Value Ref Range Status   SARS Coronavirus 2 NEGATIVE NEGATIVE Final    Comment: (NOTE) SARS-CoV-2 target nucleic acids are NOT DETECTED.  The SARS-CoV-2 RNA is generally detectable in upper and lower respiratory specimens during the acute phase of infection. Negative results do not preclude SARS-CoV-2 infection, do not  rule out co-infections with other pathogens, and should not be used as the sole basis for treatment or other patient management decisions. Negative results must be combined with clinical observations, patient history, and epidemiological information. The expected result is Negative.  Fact Sheet for Patients: SugarRoll.be  Fact Sheet for Healthcare Providers: https://www.woods-mathews.com/  This test is not yet approved or cleared by the [place] FDA and  has been authorized for detection and/or diagnosis of SARS-CoV-2 by FDA under an Emergency Use Authorization (EUA). This EUA will remain  in effect (meaning this test can be used) for the duration of the COVID-19 declaration under Se ction 564(b)(1) of the Act, 21 U.S.C. section 360bbb-3(b)(1), unless the authorization is terminated or revoked sooner.  Performed at [facility], [place] [address], [place], [place] [address]   MRSA PCR Screening     Status: None   Collection Time: 02/20/20  6:46 PM   Specimen: Nasal Mucosa; Nasopharyngeal  Result Value Ref Range Status   MRSA by PCR NEGATIVE NEGATIVE Final    Comment:        The GeneXpert MRSA Assay (FDA approved for NASAL specimens only), is one component of a comprehensive MRSA colonization surveillance program. It is not intended to diagnose MRSA infection nor to guide or monitor treatment for MRSA infections. Performed at [facility], [place] [address], [place], [place] [address]     Studies/Results: No results found.    Assessment/Plan:  INTERVAL HISTORY:  Discontinued antibiotics yesterday. Scheduled for bronchoscopy/EBUS today at 2 PM MRI brain pending  Principal Problem:   Cavitary lesion of lung Active Problems:   COVID-19 virus infection   AKI (acute kidney injury) ([place])   COPD (chronic obstructive pulmonary disease) ([place])   History of bladder cancer   Pulmonary nodules/lesions,  multiple    Corey Dixon is a 65 y.o. male with of COPD, bladder cancer status post chemo and BCG in the right nephrectomy, GERD and a recent Covid infection who presented with worsening dyspnea and found to have a cavitary lesion in pulmonary nodules on CT chest.   Patient continues to be afebrile. He has mild leukocytosis but is on chronic steroids therapy.   We have discontinued antibiotics for now. Patient reports a remote allergy to penicillin as a child so will test for a current penicillin allergy if antibiotics is needed.  Negative cryptococcal antigen, pending blastomyces antigen, histoplasma antigen, coccidioides, acid fast smear and culture.   Pending bronchoscopy and MRI brains by PCCM.  ID will continue to follow.    LOS: 1 day   Lacinda Axon 02/21/2020, 10:16 AM

## 2020-02-21 NOTE — Progress Notes (Signed)
MD gave verbal order to administer PRN hydralazine medication to patient following the blood pressure reading of 157/92, RN has implemented this intervention and will continue to monitor this patient

## 2020-02-21 NOTE — Op Note (Signed)
  Name:  Corey Dixon MRN:  [id number] DOB:  24-Jan-1956  PROCEDURE NOTE  Procedure(s): Flexible bronchoscopy [phone]) Brushing [phone]) of the LUL Bronchial alveolar lavage (27517) of the LUL  Endobronchial ultrasound (00174) Transbronchial needle aspiration (94496) of the 4R LN  Transbronchial needle aspiration, additional lobe (75916) of the 4L  Indications:  LUL cavity + mediastinal lymphadenopathy + multiple lung nodules Recent covid infection  Consent:  Written informed consent was obtained prior to the procedure. The risks of the procedure including coughing, bleeding and the small chance of lung puncture requiring chest tube were discussed in great detail. The benefits & alternatives including serial follow up were also discussed.  Anesthesia:  General endotracheal.  Procedure summary:  Appropriate equipment was assembled.  The patient was  identified as [name]. Interim history obtained and brought to the operating room. Safety timeout was performed. The patient was placed supine on the operating table, airway established and general anesthesia administered by Anesthesia team.   After the appropriate level of anesthesia was assured, flexible video bronchoscope was lubricated and inserted through the endotracheal tube.    Airway examination was performed bilaterally to subsegmental level.  Minimal clear secretions were noted, mucosa appeared normal and no endobronchial lesions were identified.  Endobronchial ultrasound video bronchoscope was then lubricated and inserted through the endotracheal tube. Surveillance of the mediastinal and and bilateral hilar lymph node stations was performed.  Pathologically enlarged lymph nodes were noted at stations 4R & 4L  Endobronchial ultrasound guided transbronchial needle aspiration of 4R (passes x6-7 ), 4L (passes x2) was performed, after which EBUS bronchoscope was withdrawn.  Flexible video bronchoscope was used again to perform  random endobronchial mucosal biopsies.  After ensuring hemostasis , the bronchoscope was withdrawn.  The patient was extubated in operating room and transferred to PACU. Post-procedure chest x-ray was ordered.  Specimens sent: Bronchial alveolar lavage specimen of the LUL for  microbiology and cytology. Brushings LUL- for cytology TBNA of 4R & 4L for cytology TBNA for afb  Complications:  No immediate complications were noted.  Hemodynamic parameters and oxygenation remained stable throughout the procedure.  Estimated blood loss:  Less then 5 mL.   Kara Mead MD. [facility]. [place] [facility] Pager [phone] If no response call [phone]   02/21/2020 3:12 PM

## 2020-02-21 NOTE — Anesthesia Procedure Notes (Addendum)
Procedure Name: Intubation Date/Time: 02/21/2020 1:53 PM Performed by: Catalina Gravel, MD Pre-anesthesia Checklist: Patient identified, Emergency Drugs available, Suction available and Patient being monitored Patient Re-evaluated:Patient Re-evaluated prior to induction Oxygen Delivery Method: Circle system utilized Preoxygenation: Pre-oxygenation with 100% oxygen Induction Type: IV induction and Rapid sequence Ventilation: Mask ventilation without difficulty Laryngoscope Size: Glidescope and 3 Grade View: Grade I Tube type: Oral Tube size: 8.5 mm Number of attempts: 1 Airway Equipment and Method: Video-laryngoscopy and Rigid stylet Placement Confirmation: ETT inserted through vocal cords under direct vision,  positive ETCO2 and breath sounds checked- equal and bilateral Secured at: 24 cm Tube secured with: Tape Dental Injury: Teeth and Oropharynx as per pre-operative assessment  Comments: Elective glidescope. Rule out TB pt, recent COVID +

## 2020-02-22 ENCOUNTER — Inpatient Hospital Stay (HOSPITAL_COMMUNITY): Payer: Medicare Other

## 2020-02-22 ENCOUNTER — Encounter (HOSPITAL_COMMUNITY): Payer: Self-pay | Admitting: Pulmonary Disease

## 2020-02-22 DIAGNOSIS — R590 Localized enlarged lymph nodes: Secondary | ICD-10-CM | POA: Diagnosis not present

## 2020-02-22 DIAGNOSIS — J984 Other disorders of lung: Secondary | ICD-10-CM | POA: Diagnosis not present

## 2020-02-22 DIAGNOSIS — J432 Centrilobular emphysema: Secondary | ICD-10-CM | POA: Diagnosis not present

## 2020-02-22 DIAGNOSIS — N179 Acute kidney failure, unspecified: Secondary | ICD-10-CM | POA: Diagnosis not present

## 2020-02-22 DIAGNOSIS — U071 COVID-19: Secondary | ICD-10-CM | POA: Diagnosis not present

## 2020-02-22 LAB — COMPREHENSIVE METABOLIC PANEL
ALT: 44 U/L (ref 0–44)
AST: 28 U/L (ref 15–41)
Albumin: 3.2 g/dL — ABNORMAL LOW (ref 3.5–5.0)
Alkaline Phosphatase: 79 U/L (ref 38–126)
Anion gap: 12 (ref 5–15)
BUN: 33 mg/dL — ABNORMAL HIGH (ref 8–23)
CO2: 22 mmol/L (ref 22–32)
Calcium: 8.9 mg/dL (ref 8.9–10.3)
Chloride: 101 mmol/L (ref 98–111)
Creatinine, Ser: 1.59 mg/dL — ABNORMAL HIGH (ref 0.61–1.24)
GFR, Estimated: 48 mL/min — ABNORMAL LOW (ref >60)
Glucose, Bld: 139 mg/dL — ABNORMAL HIGH (ref 70–99)
Potassium: 4.5 mmol/L (ref 3.5–5.1)
Sodium: 135 mmol/L (ref 135–145)
Total Bilirubin: 0.5 mg/dL (ref 0.3–1.2)
Total Protein: 6.5 g/dL (ref 6.5–8.1)

## 2020-02-22 LAB — CBC WITH DIFFERENTIAL/PLATELET
Abs Immature Granulocytes: 0.17 10*3/uL — ABNORMAL HIGH (ref 0.00–0.07)
Basophils Absolute: 0 10*3/uL (ref 0.0–0.1)
Basophils Relative: 0 %
Eosinophils Absolute: 0 10*3/uL (ref 0.0–0.5)
Eosinophils Relative: 0 %
HCT: 39 % (ref 39.0–52.0)
Hemoglobin: 12.8 g/dL — ABNORMAL LOW (ref 13.0–17.0)
Immature Granulocytes: 1 %
Lymphocytes Relative: 4 %
Lymphs Abs: 0.6 10*3/uL — ABNORMAL LOW (ref 0.7–4.0)
MCH: 27.5 pg (ref 26.0–34.0)
MCHC: 32.8 g/dL (ref 30.0–36.0)
MCV: 83.7 fL (ref 80.0–100.0)
Monocytes Absolute: 1.3 10*3/uL — ABNORMAL HIGH (ref 0.1–1.0)
Monocytes Relative: 9 %
Neutro Abs: 12.8 10*3/uL — ABNORMAL HIGH (ref 1.7–7.7)
Neutrophils Relative %: 86 %
Platelets: 336 10*3/uL (ref 150–400)
RBC: 4.66 MIL/uL (ref 4.22–5.81)
RDW: 12.3 % (ref 11.5–15.5)
WBC: 14.9 10*3/uL — ABNORMAL HIGH (ref 4.0–10.5)
nRBC: 0 % (ref 0.0–0.2)

## 2020-02-22 LAB — FERRITIN: Ferritin: 208 ng/mL (ref 24–336)

## 2020-02-22 LAB — ACID FAST SMEAR (AFB, MYCOBACTERIA)
Acid Fast Smear: NEGATIVE
Acid Fast Smear: NEGATIVE

## 2020-02-22 LAB — PHOSPHORUS: Phosphorus: 2.9 mg/dL (ref 2.5–4.6)

## 2020-02-22 LAB — CYTOLOGY - NON PAP

## 2020-02-22 LAB — D-DIMER, QUANTITATIVE: D-Dimer, Quant: 0.84 ug/mL-FEU — ABNORMAL HIGH (ref 0.00–0.50)

## 2020-02-22 LAB — C-REACTIVE PROTEIN: CRP: 0.6 mg/dL (ref <1.0)

## 2020-02-22 LAB — MAGNESIUM: Magnesium: 1.9 mg/dL (ref 1.7–2.4)

## 2020-02-22 MED ORDER — LACTATED RINGERS IV SOLN: INTRAVENOUS | Status: AC

## 2020-02-22 MED ORDER — IOHEXOL 9 MG/ML PO SOLN
500.0000 mL | ORAL | Status: AC
  Administered 2020-02-22 (×2): 500 mL via ORAL

## 2020-02-22 MED ORDER — PREDNISONE 20 MG PO TABS
40.0000 mg | ORAL_TABLET | Freq: Every day | ORAL | Status: DC
  Administered 2020-02-23 – 2020-02-25 (×3): 40 mg via ORAL
  Filled 2020-02-22 (×3): qty 2

## 2020-02-22 MED ORDER — PIPERACILLIN-TAZOBACTAM 3.375 G IVPB
3.3750 g | Freq: Three times a day (TID) | INTRAVENOUS | Status: DC
  Administered 2020-02-22 – 2020-02-23 (×2): 3.375 g via INTRAVENOUS
  Filled 2020-02-22 (×3): qty 50

## 2020-02-22 MED ORDER — AMLODIPINE BESYLATE 5 MG PO TABS
5.0000 mg | ORAL_TABLET | Freq: Once | ORAL | Status: AC
  Administered 2020-02-22: 5 mg via ORAL
  Filled 2020-02-22: qty 1

## 2020-02-22 MED ORDER — HYDRALAZINE HCL 25 MG PO TABS: 25.0000 mg | ORAL_TABLET | Freq: Two times a day (BID) | ORAL | Status: DC

## 2020-02-22 MED ORDER — HYDRALAZINE HCL 20 MG/ML IJ SOLN
10.0000 mg | Freq: Four times a day (QID) | INTRAMUSCULAR | Status: DC | PRN
  Administered 2020-02-22 – 2020-02-23 (×3): 10 mg via INTRAVENOUS
  Filled 2020-02-22 (×2): qty 1

## 2020-02-22 MED ORDER — DIPHENHYDRAMINE HCL 25 MG PO CAPS: 25.0000 mg | ORAL_CAPSULE | Freq: Once | ORAL | Status: DC | PRN

## 2020-02-22 MED ORDER — EPINEPHRINE 0.3 MG/0.3ML IJ SOAJ
0.3000 mg | Freq: Once | INTRAMUSCULAR | Status: DC | PRN
  Filled 2020-02-22: qty 0.6

## 2020-02-22 MED ORDER — IOHEXOL 9 MG/ML PO SOLN
ORAL | Status: AC
  Administered 2020-02-22: 500 mL
  Filled 2020-02-22: qty 1000

## 2020-02-22 MED ORDER — AMLODIPINE BESYLATE 10 MG PO TABS
10.0000 mg | ORAL_TABLET | Freq: Every day | ORAL | Status: DC
  Administered 2020-02-23 – 2020-02-25 (×3): 10 mg via ORAL
  Filled 2020-02-22 (×3): qty 1

## 2020-02-22 MED ORDER — HYDRALAZINE HCL 20 MG/ML IJ SOLN
5.0000 mg | Freq: Once | INTRAMUSCULAR | Status: AC
  Administered 2020-02-22: 5 mg via INTRAVENOUS

## 2020-02-22 MED ORDER — DIPHENHYDRAMINE HCL 50 MG/ML IJ SOLN: 25.0000 mg | Freq: Once | INTRAMUSCULAR | Status: DC | PRN

## 2020-02-22 MED ORDER — HYDRALAZINE HCL 20 MG/ML IJ SOLN
10.0000 mg | Freq: Four times a day (QID) | INTRAMUSCULAR | Status: DC | PRN
  Filled 2020-02-22: qty 1

## 2020-02-22 MED ORDER — PANTOPRAZOLE SODIUM 40 MG IV SOLR
40.0000 mg | Freq: Two times a day (BID) | INTRAVENOUS | Status: DC
  Administered 2020-02-22 – 2020-02-24 (×5): 40 mg via INTRAVENOUS
  Filled 2020-02-22 (×5): qty 40

## 2020-02-22 MED ORDER — MORPHINE SULFATE (PF) 2 MG/ML IV SOLN
2.0000 mg | INTRAVENOUS | Status: DC | PRN
  Administered 2020-02-22 – 2020-02-24 (×6): 2 mg via INTRAVENOUS
  Filled 2020-02-22 (×6): qty 1

## 2020-02-22 NOTE — Progress Notes (Addendum)
Pharmacy Antibiotic Note  Corey Dixon is a 65 y.o. male admitted on 02/18/2020 with possible bowel perforation. Pharmacy has been consulted for piperacillin/tazobactam dosing.  WBC 14.9 on steroids. Afebrile but receiving acetaminophen for pain. ClCr ~50 m/min. Noted penicillin allergy with unknown reaction, patient was a child. Spoke with patient and he is willing to try piperacillin/tazobactam. Patient is already on steroids and added PRN diphenhydramine and epinephrine, ok by MD.     CT Abd: Moderate pneumoperitoneum of indeterminate etiology but concerning for bowel perforation  Plan: Zosyn 3.375g IV q8h (4 hour infusion).  Monitor cultures, clinical status, renal fx, allergic reaction  Narrow abx as able and f/u duration    Height: 6' (182.9 cm) Weight: 81.9 kg (180 lb 8.9 oz) IBW/kg (Calculated) : 77.6  Temp (24hrs), Avg:98.3 F (36.8 C), Min:97.7 F (36.5 C), Max:98.9 F (37.2 C)  Recent Labs  Lab 02/18/20 2125 02/18/20 2234 02/20/20 0318 02/21/20 0747 02/22/20 0721  WBC 9.5  --  13.4* 14.2* 14.9*  CREATININE 2.02*  --  1.46* 1.43* 1.59*  LATICACIDVEN  --  1.2  --   --   --     Estimated Creatinine Clearance: 50.2 mL/min (A) (by C-G formula based on SCr of 1.59 mg/dL (H)).    Allergies  Allergen Reactions  . Penicillins     Unknown, pt was a child    Antimicrobials this admission: Cefe 1/23 >> 1/25 Vanc 1/24 >> 1/25 Pip/tazo 1/27 >>   Microbiology results: 1/23 BCx: ngtd 1/26 BAL: ngtd 1/26  Sputum AFB x2: negative   1/25 MRSA neg     Thank you for allowing pharmacy to be a part of this patient's care.  Benetta Spar, PharmD, BCPS, BCCP Clinical Pharmacist  Please check AMION for all [place] phone numbers After 10:00 PM, call [place] [phone]

## 2020-02-22 NOTE — Progress Notes (Signed)
Pt refused cpap

## 2020-02-22 NOTE — Consult Note (Addendum)
CC: I feel fine  Requesting provider: Dr Tyrell Antonio  HPI: Corey Dixon is an 65 y.o. male who was admitted 4 days ago with SOB, increased work of breathing, reproducible chest pain that apparently had been ongoing since diagnosed with Covid on January 10.  He reportedly had some nausea and vomiting within the first and second days of the symptoms but that quickly resolved.  His oxygen saturation remained normal at home.  He came to the ED for further evaluation because of the breathing.  He is found to have some acute kidney injury on admission.  His Covid PCR test was negative.  He underwent a CT of the chest without contrast which showed a left upper lobe opacity corresponding to a cavitary mass with the left upper lobe possibly representing a postinflammatory cavity or less likely a cavitary neoplasm.  Infectious disease was consulted and he was started on empiric treatment of vancomycin and cefepime.  There was concern of potential TB.  There were also numerous pulmonary nodules as well as mediastinal adenopathy on the CT scan.  Pulmonology/CCM was consulted to weigh in on the left upper lobe lesion and felt it was much less likely to be TB since patient had had multiple TB test in the past because he gets BCG for his bladder cancer.  He underwent flexible bronchoscopy, brushing of the left upper lobe, BAL, endobronchial ultrasound, and transbronchial needle aspiration of lymph nodes yesterday.  He had a chest x-ray this morning which showed persistent but improved airspace opacity as well as lucency underneath the right hemidiaphragm which was new.  Radiology recommended a noncontrasted CT of the abdomen.  The noncontrasted CT of the abdomen pelvis was performed which demonstrated a moderate amount of pneumoperitoneum.  There was colonic diverticulosis without any evidence of diverticulitis.  There was no free fluid.  There is concerns for pulmonary and hepatic metastatic disease with bilateral lung  nodules, and a 17 mm hyperdense lesion in the left lobe the liver   We were asked to evaluate the patient because of the pneumoperitoneum  Patient denies any abdominal pain.  He denies any nausea vomiting.  Last bowel movement was yesterday.  He states that he feels fine.  There has not been any fever or tachycardia.  The patient has not beta blocked.  He is on steroids however. TRH did start zosyn.   Has medical history of COPD, GERD, bladder cancer status post BCG and right nephrectomy.  Neurology is also evaluated him since admission for headache.  Past Medical History:  Diagnosis Date  . Bladder cancer ([place])   . COPD (chronic obstructive pulmonary disease) ([place])     Past Surgical History:  Procedure Laterality Date  . BRONCHIAL BRUSHINGS  02/21/2020   Procedure: BRONCHIAL BRUSHINGS;  Surgeon: Rigoberto Noel, MD;  Location: [facility] ENDOSCOPY;  Service: Cardiopulmonary;;  . BRONCHIAL NEEDLE ASPIRATION BIOPSY  02/21/2020   Procedure: BRONCHIAL NEEDLE ASPIRATION BIOPSIES;  Surgeon: Rigoberto Noel, MD;  Location: [place];  Service: Cardiopulmonary;;  . BRONCHIAL WASHINGS  02/21/2020   Procedure: BRONCHIAL WASHINGS;  Surgeon: Rigoberto Noel, MD;  Location: [place];  Service: Cardiopulmonary;;  . ENDOBRONCHIAL ULTRASOUND N/A 02/21/2020   Procedure: ENDOBRONCHIAL ULTRASOUND;  Surgeon: Rigoberto Noel, MD;  Location: [place];  Service: Cardiopulmonary;  Laterality: N/A;  . RENAL ARTERY STENT  2009, 09/15/2016  . VIDEO BRONCHOSCOPY N/A 02/21/2020   Procedure: VIDEO BRONCHOSCOPY WITH FLUORO;  Surgeon: Rigoberto Noel, MD;  Location: [place];  Service:  Cardiopulmonary;  Laterality: N/A;    History reviewed. No pertinent family history.  Social:  reports that he has quit smoking. His smoking use included e-cigarettes. He has never used smokeless tobacco. He reports current alcohol use. No history on file for drug use.  Allergies:  Allergies  Allergen Reactions  . Penicillins      Unknown, pt was a child    Medications: I have reviewed the patient's current medications.   ROS - all of the below systems have been reviewed with the patient and positives are indicated with bold text General: chills, fever or night sweats Eyes: blurry vision or double vision ENT: epistaxis or sore throat Allergy/Immunology: itchy/watery eyes or nasal congestion Hematologic/Lymphatic: bleeding problems, blood clots or swollen lymph nodes Endocrine: temperature intolerance or unexpected weight changes Breast: new or changing breast lumps or nipple discharge Resp: cough, shortness of breath, or wheezing CV: chest pain or dyspnea on exertion GI: as per HPI GU: dysuria, trouble voiding, or hematuria MSK: joint pain or joint stiffness Neuro: TIA or stroke symptoms Derm: pruritus and skin lesion changes Psych: anxiety and depression  PE Blood pressure (!) 156/75, pulse 87, temperature 97.7 F (36.5 C), resp. rate 18, height 6' (1.829 m), weight 81.9 kg, SpO2 96 %. Constitutional: NAD; conversant; no deformities Eyes: Moist conjunctiva; no lid lag; anicteric; PERRL Neck: Trachea midline; no thyromegaly Lungs: Normal respiratory effort; no tactile fremitus CV: RRR; no palpable thrills; no pitting edema GI: Abd completely soft, nontender, small old incision around umbilicus, no peritonitis or guarding; no palpable hepatosplenomegaly MSK: Normal gait; no clubbing/cyanosis Psychiatric: Appropriate affect; alert and oriented x3 Lymphatic: No palpable cervical or axillary lymphadenopathy Skin:no rash/lesion/edema  Results for orders placed or performed during the hospital encounter of 02/18/20 (from the past 48 hour(s))  Cytology - Non PAP;     Status: None   Collection Time: 02/21/20 12:00 AM  Result Value Ref Range   CYTOLOGY - NON GYN      CYTOLOGY - NON PAP CASE: [id number] PATIENT: Corey Dixon Non-Gynecological Cytology Report     Clinical History: Left upper lobe  cavity Specimen Submitted:  B. LUNG, LEFT UPPER LOBE, BRUSHING:   FINAL MICROSCOPIC DIAGNOSIS: - Atypical cells present  SPECIMEN ADEQUACY: Satisfactory for evaluation  GROSS: Received is/are (B) (1) 2 Slides (1 Quick Stain).  (2) 2 Slides (1 Quick Stain).  (3) 2 Slides (1 Quick Stain).  (MA:MW:mw) Smears: 6 Concentration Method (Thin Prep): 0 Cell Block: 0 Additional Studies: Specimens A and E are submitted directly to microbiology     Final Diagnosis performed by Gillie Manners, MD.   Electronically signed 02/22/2020 Technical and / or Professional components performed at [facility]. [facility], [place] [address], [place], [place] [address].  Immunohistochemistry Technical component (if applicable) was performed at [facility]. [address], [place], [place], [place] [address].   IMMUNOHISTOC HEMISTRY DISCLAIMER (if applicable): Some of these immunohistochemical stains may have been developed and the performance characteristics determine by [facility]. Some may not have been cleared or approved by the U.S. Food and Drug Administration. The FDA has determined that such clearance or approval is not necessary. This test is used for clinical purposes. It should not be regarded as investigational or for research. This laboratory is certified under the [place] (CLIA-88) as qualified to perform high complexity clinical laboratory testing.  The controls stained appropriately.   CBC with Differential/Platelet     Status: Abnormal  Collection Time: 02/21/20  7:47 AM  Result Value Ref Range   WBC 14.2 (H) 4.0 - 10.5 K/uL   RBC 4.57 4.22 - 5.81 MIL/uL   Hemoglobin 12.5 (L) 13.0 - 17.0 g/dL   HCT 38.4 (L) 39.0 - 52.0 %   MCV 84.0 80.0 - 100.0 fL   MCH 27.4 26.0 - 34.0 pg   MCHC 32.6 30.0 - 36.0 g/dL   RDW 12.3 11.5 - 15.5 %   Platelets 361 150 - 400 K/uL   nRBC 0.0 0.0 - 0.2 %   Neutrophils  Relative % 91 %   Neutro Abs 13.0 (H) 1.7 - 7.7 K/uL   Lymphocytes Relative 4 %   Lymphs Abs 0.5 (L) 0.7 - 4.0 K/uL   Monocytes Relative 4 %   Monocytes Absolute 0.5 0.1 - 1.0 K/uL   Eosinophils Relative 0 %   Eosinophils Absolute 0.0 0.0 - 0.5 K/uL   Basophils Relative 0 %   Basophils Absolute 0.0 0.0 - 0.1 K/uL   Immature Granulocytes 1 %   Abs Immature Granulocytes 0.14 (H) 0.00 - 0.07 K/uL    Comment: Performed at [facility], [address] [place], [place] [address]  Comprehensive metabolic panel     Status: Abnormal   Collection Time: 02/21/20  7:47 AM  Result Value Ref Range   Sodium 134 (L) 135 - 145 mmol/L   Potassium 4.4 3.5 - 5.1 mmol/L   Chloride 102 98 - 111 mmol/L   CO2 20 (L) 22 - 32 mmol/L   Glucose, Bld 158 (H) 70 - 99 mg/dL    Comment: Glucose reference range applies only to samples taken after fasting for at least 8 hours.   BUN 27 (H) 8 - 23 mg/dL   Creatinine, Ser 1.43 (H) 0.61 - 1.24 mg/dL   Calcium 9.2 8.9 - 10.3 mg/dL   Total Protein 7.1 6.5 - 8.1 g/dL   Albumin 3.4 (L) 3.5 - 5.0 g/dL   AST 18 15 - 41 U/L   ALT 28 0 - 44 U/L   Alkaline Phosphatase 76 38 - 126 U/L   Total Bilirubin 0.8 0.3 - 1.2 mg/dL   GFR, Estimated 54 (L) >60 mL/min    Comment: (NOTE) Calculated using the CKD-EPI Creatinine Equation (2021)    Anion gap 12 5 - 15    Comment: Performed at [facility] [address], [place], [place] [address]  C-reactive protein     Status: None   Collection Time: 02/21/20  7:47 AM  Result Value Ref Range   CRP 0.7 <1.0 mg/dL    Comment: Performed at [facility] [address] [place], [place] [address]  D-dimer, quantitative (not at [facility])     Status: Abnormal   Collection Time: 02/21/20  7:47 AM  Result Value Ref Range   D-Dimer, Quant 0.74 (H) 0.00 - 0.50 ug/mL-FEU    Comment: (NOTE) At the manufacturer cut-off value of 0.5 g/mL FEU, this assay has a negative predictive value of 95-100%.This assay is intended for  use in conjunction with a clinical pretest probability (PTP) assessment model to exclude pulmonary embolism (PE) and deep venous thrombosis (DVT) in outpatients suspected of PE or DVT. Results should be correlated with clinical presentation. Performed at [facility], [place] [address], [place], [place] [address]   Ferritin     Status: None   Collection Time: 02/21/20  7:47 AM  Result Value Ref Range   Ferritin 200  24 - 336 ng/mL    Comment: Performed at [facility], [place] [address], [place], [place] [address]  Magnesium     Status: None   Collection Time: 02/21/20  7:47 AM  Result Value Ref Range   Magnesium 1.9 1.7 - 2.4 mg/dL    Comment: Performed at [facility] [address] [place], [place] [address]  Phosphorus     Status: None   Collection Time: 02/21/20  7:47 AM  Result Value Ref Range   Phosphorus 3.4 2.5 - 4.6 mg/dL    Comment: Performed at [facility] [address] [place], [place] [address]  Vitamin B12     Status: None   Collection Time: 02/21/20  7:47 AM  Result Value Ref Range   Vitamin B-12 782 180 - 914 pg/mL    Comment: (NOTE) This assay is not validated for testing neonatal or myeloproliferative syndrome specimens for Vitamin B12 levels. Performed at [facility], [place] [address] [place], [place] [address]   Folate     Status: Abnormal   Collection Time: 02/21/20  7:47 AM  Result Value Ref Range   Folate 3.4 (L) >5.9 ng/mL    Comment: Performed at [facility], [place] [address] [place], [place] [address]  Iron and TIBC     Status: None   Collection Time: 02/21/20  7:47 AM  Result Value Ref Range   Iron 111 45 - 182 ug/dL   TIBC 364 250 - 450 ug/dL   Saturation Ratios 30 17.9 - 39.5 %   UIBC 253 ug/dL    Comment: Performed at [facility], [place] [address] [place], [place] [address]  Reticulocytes     Status: None   Collection Time: 02/21/20  7:47 AM  Result Value Ref Range   Retic Ct Pct 1.8 0.4 - 3.1 %    RBC. 4.50 4.22 - 5.81 MIL/uL   Retic Count, Absolute 80.6 19.0 - 186.0 K/uL   Immature Retic Fract 6.0 2.3 - 15.9 %    Comment: Performed at [facility] [address], [place], [place] [address]  Culture, BAL-quantitative     Status: None (Preliminary result)   Collection Time: 02/21/20  2:16 PM   Specimen: PATH Cytology washing; Lung  Result Value Ref Range   Specimen Description BRONCHIAL WASHINGS    Special Requests LUL SAMPLE A    Gram Stain      FEW WBC PRESENT, PREDOMINANTLY MONONUCLEAR NO ORGANISMS SEEN    Culture      NO GROWTH < 24 HOURS Performed at [facility], [place] [address], [place], [place] [address]    Report Status PENDING   Acid Fast Smear (AFB)     Status: None   Collection Time: 02/21/20  2:16 PM   Specimen: PATH Cytology washing; Lung  Result Value Ref Range   AFB Specimen Processing Concentration    Acid Fast Smear Negative     Comment: (NOTE) Performed At: [facility] [place], [place] [id number] Rush Farmer MD [id number]    Source (AFB) BRONCHIAL WASHINGS     Comment: LUL SAMPLE A Performed at [facility], [place] [address], [place], [place] [address]   Cytology - Non PAP;     Status: None   Collection Time: 02/21/20  2:22 PM  Result Value Ref Range   CYTOLOGY - NON GYN      CYTOLOGY - NON  PAP CASE: [id number] PATIENT: Corey Dixon Non-Gynecological Cytology Report     Clinical History: Left upper lobe cavity Specimen Submitted:  C. LUNG, LEFT UPPER LOBE, WASHING #2:   FINAL MICROSCOPIC DIAGNOSIS: - Atypical cells present  SPECIMEN ADEQUACY: Satisfactory for evaluation  GROSS: Received is/are 12 cc's of clear colorless fluid (MW:mw) Smears: 0 Concentration Method (Thin Prep): 1 Cell Block: 0 Additional Studies: Specimens A and E are submitted directly to microbiology     Final Diagnosis performed by Gillie Manners, MD.   Electronically signed 02/22/2020 Technical and /  or Professional components performed at [employer]. [facility], [place] [address] [place], [place] [address].  Immunohistochemistry Technical component (if applicable) was performed at [facility]. [address], [place], [place], [place] [address].   IMMUNOHISTOCHEMISTRY DISCLAIMER (if applicable): Some of these immunohistoche mical stains may have been developed and the performance characteristics determine by [facility]. Some may not have been cleared or approved by the U.S. Food and Drug Administration. The FDA has determined that such clearance or approval is not necessary. This test is used for clinical purposes. It should not be regarded as investigational or for research. This laboratory is certified under the [place] (CLIA-88) as qualified to perform high complexity clinical laboratory testing.  The controls stained appropriately.   Acid Fast Smear (AFB)     Status: None   Collection Time: 02/21/20  2:49 PM   Specimen: PATH Cytology FNA; Node  Result Value Ref Range   AFB Specimen Processing Concentration    Acid Fast Smear Negative     Comment: (NOTE) Performed At: [facility] [place], [place] [id number] Rush Farmer MD [id number]    Source (AFB) NEEDLE ASPIRATE SAMPLE E     Comment: Performed at [facility], [place] [address], [place], [place] [address]  CBC with Differential/Platelet     Status: Abnormal   Collection Time: 02/22/20  7:21 AM  Result Value Ref Range   WBC 14.9 (H) 4.0 - 10.5 K/uL   RBC 4.66 4.22 - 5.81 MIL/uL   Hemoglobin 12.8 (L) 13.0 - 17.0 g/dL   HCT 39.0 39.0 - 52.0 %   MCV 83.7 80.0 - 100.0 fL   MCH 27.5 26.0 - 34.0 pg   MCHC 32.8 30.0 - 36.0 g/dL   RDW 12.3 11.5 - 15.5 %   Platelets 336 150 - 400 K/uL   nRBC 0.0 0.0 - 0.2 %   Neutrophils Relative % 86 %   Neutro Abs 12.8 (H) 1.7 - 7.7 K/uL   Lymphocytes Relative 4 %   Lymphs  Abs 0.6 (L) 0.7 - 4.0 K/uL   Monocytes Relative 9 %   Monocytes Absolute 1.3 (H) 0.1 - 1.0 K/uL   Eosinophils Relative 0 %   Eosinophils Absolute 0.0 0.0 - 0.5 K/uL   Basophils Relative 0 %   Basophils Absolute 0.0 0.0 - 0.1 K/uL   Immature Granulocytes 1 %   Abs Immature Granulocytes 0.17 (H) 0.00 - 0.07 K/uL    Comment: Performed at [facility] [address], [place], [place] [address]  Comprehensive metabolic panel     Status: Abnormal   Collection Time: 02/22/20  7:21 AM  Result Value Ref Range   Sodium 135 135 - 145 mmol/L   Potassium 4.5 3.5 - 5.1 mmol/L   Chloride 101 98 - 111 mmol/L   CO2 22 22 - 32 mmol/L   Glucose,  Bld 139 (H) 70 - 99 mg/dL    Comment: Glucose reference range applies only to samples taken after fasting for at least 8 hours.   BUN 33 (H) 8 - 23 mg/dL   Creatinine, Ser 1.59 (H) 0.61 - 1.24 mg/dL   Calcium 8.9 8.9 - 10.3 mg/dL   Total Protein 6.5 6.5 - 8.1 g/dL   Albumin 3.2 (L) 3.5 - 5.0 g/dL   AST 28 15 - 41 U/L   ALT 44 0 - 44 U/L   Alkaline Phosphatase 79 38 - 126 U/L   Total Bilirubin 0.5 0.3 - 1.2 mg/dL   GFR, Estimated 48 (L) >60 mL/min    Comment: (NOTE) Calculated using the CKD-EPI Creatinine Equation (2021)    Anion gap 12 5 - 15    Comment: Performed at [facility] [address] [place], [place] [address]  C-reactive protein     Status: None   Collection Time: 02/22/20  7:21 AM  Result Value Ref Range   CRP 0.6 <1.0 mg/dL    Comment: Performed at [facility], [place] [address], [place], [place] [address]  D-dimer, quantitative (not at [facility])     Status: Abnormal   Collection Time: 02/22/20  7:21 AM  Result Value Ref Range   D-Dimer, Quant 0.84 (H) 0.00 - 0.50 ug/mL-FEU    Comment: (NOTE) At the manufacturer cut-off value of 0.5 g/mL FEU, this assay has a negative predictive value of 95-100%.This assay is intended for use in conjunction with a clinical pretest probability (PTP) assessment model to exclude  pulmonary embolism (PE) and deep venous thrombosis (DVT) in outpatients suspected of PE or DVT. Results should be correlated with clinical presentation. Performed at [facility], [place] [address] [place], [place] [address]   Ferritin     Status: None   Collection Time: 02/22/20  7:21 AM  Result Value Ref Range   Ferritin 208 24 - 336 ng/mL    Comment: Performed at [facility] [address], [place], [place] [address]  Magnesium     Status: None   Collection Time: 02/22/20  7:21 AM  Result Value Ref Range   Magnesium 1.9 1.7 - 2.4 mg/dL    Comment: Performed at [facility] [address] [place], [place] [address]  Phosphorus     Status: None   Collection Time: 02/22/20  7:21 AM  Result Value Ref Range   Phosphorus 2.9 2.5 - 4.6 mg/dL    Comment: Performed at [facility] [address], [place], [place] [address]    CT ABDOMEN PELVIS WO CONTRAST  Result Date: 02/22/2020 CLINICAL DATA:  65 year old male with concern for peritonitis or perforation. EXAM: CT ABDOMEN AND PELVIS WITHOUT CONTRAST TECHNIQUE: Multidetector CT imaging of the abdomen and pelvis was performed following the standard protocol without IV contrast. COMPARISON:  Chest radiograph dated 02/22/2020. FINDINGS: Evaluation of this exam is limited in the absence of intravenous contrast. Lower chest: There is background of emphysema. Right lung base nodular and streaky densities may represent atelectasis but concerning for infiltrate. Several scattered bilateral pulmonary nodules measure up to 11 mm also seen on the prior CT in keeping with metastatic disease. There is moderate pneumoperitoneum. No free fluid. Hepatobiliary: There is a 17 mm hypodense lesion in the left lobe of the liver which is not characterized on this CT, but most consistent with metastatic disease. No intrahepatic biliary dilatation. The gallbladder is unremarkable. Pancreas: Unremarkable.  No pancreatic ductal dilatation or  surrounding inflammatory changes. Spleen: Normal in size without focal abnormality. Adrenals/Urinary Tract: The adrenal glands unremarkable. There is a solitary left kidney. There is no hydronephrosis or nephrolithiasis of the left kidney. The left ureter and urinary bladder appear unremarkable. Stomach/Bowel: There is moderate stool throughout the colon. There is sigmoid diverticulosis and scattered colonic diverticula without active inflammatory changes. There is no bowel obstruction or active inflammation. The appendix is normal. Vascular/Lymphatic: Mild aortoiliac atherosclerotic disease. The IVC is unremarkable. No portal venous gas. There is no adenopathy. Reproductive: The prostate and seminal vesicles are grossly unremarkable. No pelvic mass. Other: Small containing bilateral inguinal hernias as well as a small fat containing umbilical hernia. Musculoskeletal: A 15 mm sclerotic lesion of L3. Severe degenerative changes of the left hip. No acute osseous pathology. Left L5 pars defect. IMPRESSION: 1. Moderate pneumoperitoneum of indeterminate etiology but concerning for bowel perforation. Clinical correlation and surgical consult is advised. 2. Colonic diverticulosis. No bowel obstruction. Normal appendix. 3. Solitary left kidney. No hydronephrosis or nephrolithiasis. 4. Pulmonary and hepatic metastatic disease. 5. A 15 mm sclerotic lesion of L3. 6. Aortic Atherosclerosis (ICD10-I70.0) and Emphysema (ICD10-J43.9). These results were called by telephone at the time of interpretation on 02/22/2020 at 7:06 pm to [place], who verbally acknowledged these results. Electronically Signed   By: Anner Crete M.D.   On: 02/22/2020 19:09   MR BRAIN W WO CONTRAST  Result Date: 02/21/2020 CLINICAL DATA:  Headache, chronic, no new features; non-small cell lung cancer, staging. EXAM: MRI HEAD WITHOUT AND WITH CONTRAST TECHNIQUE: Multiplanar, multiecho pulse sequences of the brain and surrounding structures  were obtained without and with intravenous contrast. CONTRAST:  60m GADAVIST GADOBUTROL 1 MMOL/ML IV SOLN COMPARISON:  No pertinent prior exams available for comparison. FINDINGS: Brain: Mild intermittent motion degradation. Mild cerebral and cerebellar atrophy. No abnormal intracranial enhancement is identified to suggest intracranial metastatic disease. Mild multifocal T2/FLAIR hyperintensity within the cerebral white matter is nonspecific, but compatible with chronic small vessel ischemic disease. An apparent punctate focus of T2/FLAIR hyperintensity within the left medulla is not appreciated on the axial or coronal T2 weighted sequences and is likely artifactual (series 7, image 3). Punctate focus of SWI signal loss within the right frontal lobe compatible with nonspecific chronic microhemorrhage. There is no acute infarct. No evidence of intracranial mass. No extra-axial fluid collection. No midline shift. Vascular: Expected proximal arterial flow voids. Skull and upper cervical spine: No focal marrow lesion. Sinuses/Orbits: Visualized orbits show no acute finding. Mild ethmoid sinus mucosal thickening. 1.5 cm left maxillary sinus mucous retention cyst. IMPRESSION: Mildly motion degraded examination. No intracranial metastatic disease is identified. No evidence of acute intracranial abnormality. Mild generalized atrophy of the brain and chronic small vessel ischemic disease. Nonspecific chronic microhemorrhage within the right frontal lobe. Mild ethmoid sinus mucosal thickening. 1.5 cm left maxillary sinus mucous retention cyst. Electronically Signed   By: KKellie SimmeringDO   On: 02/21/2020 18:59   DG CHEST PORT 1 VIEW  Result Date: 02/22/2020 CLINICAL DATA:  Recent COVID infection with persistent shortness of breath EXAM: PORTABLE CHEST 1 VIEW COMPARISON:  02/21/2020 FINDINGS: Cardiac shadow is within normal limits. Lungs are well aerated bilaterally. Persistent but improved airspace opacity is noted  consistent with the given clinical history. Lucency is noted beneath the right hemidiaphragm new from the previous exams suspicious for free intraperitoneal air. IMPRESSION: Persistent but improved aeration bilaterally consistent with the known history of COVID-19 positivity. Findings suggestive  of free intraperitoneal air. CT of the abdomen and pelvis is recommended for further evaluation. Electronically Signed   By: Inez Catalina M.D.   On: 02/22/2020 11:22   DG CHEST PORT 1 VIEW  Result Date: 02/21/2020 CLINICAL DATA:  History of COVID-19 positivity, recent bronchoscopy EXAM: PORTABLE CHEST 1 VIEW COMPARISON:  02/18/2020 FINDINGS: Cardiac shadow is within normal limits. The lungs are hyperinflated. Cavitary lesion in the left upper lobe laterally is again seen. Known nodules are less well appreciated on this exam. No pneumothorax is noted following bronchoscopy. IMPRESSION: No evidence of post bronchoscopy pneumothorax. Electronically Signed   By: Inez Catalina M.D.   On: 02/21/2020 15:59   DG C-ARM BRONCHOSCOPY  Result Date: 02/21/2020 C-ARM BRONCHOSCOPY: Fluoroscopy was utilized by the requesting physician.  No radiographic interpretation.    Imaging: Personally reviewed  A/P: RYLER LASKOWSKI is an 65 y.o. male with  Pneumoperitoneum Recent diagnosis of Covid COPD Left upper lobe cavitary lesion, mediastinal adenopathy, multiple pulmonary nodules Hypodense lesion of the liver History of bladder cancer Hypertension  He does have a moderate amount of pneumoperitoneum.  However there is no free fluid.  He does not have a surgical abdomen.  He has absolutely no pain on exam.  There is no peritonitis or guarding. His abdomen is soft, nondistended.   He is afebrile.  He does not have tachycardia.  He has a stable leukocytosis for the past several days.  Therefore I do not believe he needs urgent surgical exploration  Etiology of pneumoperitoneum is unclear.  He could have had a diverticular  microperforation although there is no evidence of diverticulitis, no free fluid, no bowel wall thickening.  There could also been barotrauma from his bronchoscopy to the abdomen which although rare has been described in the literature.   I discussed the x-ray and CT findings with him along with his wife who was on the phone.  I discussed what free air generally implies.  However I explained his current condition and physical exam.  I propose two options-one bowel rest and serial abdominal exams and continuation of empiric antibiotic.  Other option would be to repeat his CT scan with oral contrast and see if there is any extravasation of contrast and/or any accumulation of free fluid.  There is a small possibility that the prednisone could be masking his abdominal exam  I think repeating his CT with oral contrast would help expedite his treatment course  If his CT scan is negative for extravasation or free fluid, his abdominal exam remains benign overnight, no fever, no tachycardia then I think he could be given a diet in the morning and be reexamined throughout the morning and early afternoon  In interim - maintain npo except meds, ice chips  On a side note-the patient's wife expressed extreme frustration with the patient's treatment course and his treatment so far in the hospital.  She is very frustrated that she cannot visit the patient.  She feels that there is a new diagnosis or new working theory on a daily basis.  She states that she has been living in [place] for 25 years and has had complete confidence in [facility] health but now she has "lost faith " Lexa because his dx changes almost daily, discharge plans change.  She also states that she lost a family member recently and has been dealing with that and is under a lot of stress  I did my best to explain to the patient's wife his treatment  course so far.  I explained that generally there can be several differential diagnoses and as new  data is obtained diagnosis can change.  I also explained that sometimes the treatment plan or course can change throughout a single day.  I apologized and acknowledge her concerns.   She asked that she be contacted in the morning before 11 AM with the results of the CT scan  I told her I would have someone from our surgical service call her with the results of the CT scan  Leighton Ruff. Redmond Pulling, MD, FACS General, Bariatric, & Minimally Invasive Surgery [facility], [place]

## 2020-02-22 NOTE — Progress Notes (Signed)
Cross-coverage note:   Called regarding free air on CT abd/pelvis.   Patient with COPD, GERD, and bladder cancer s/p chemo and right nephrectomy was admitted 1/24 with chest pain and ongoing SOB since COVID was diagnosed on 1/10, found to have LUL cavitary lesion, and was admitted to hospitalist service 1/25. ID and pulm were consulted, he is on airborne precautions while ruling-out TB, and had bronchoscopy yesterday with ultrasound and LUL brushing and and BAL.   CXR today raised concern for free intraperitoneal air and this was followed by CT abd/pelvis which is concerning for moderate pneumoperitoneum of indeterminate etiology.   Patient denies abdominal pain or fevers, appears comfortable on room air. Abdomen is soft and non-tender.   He is NPO, being started on Zosyn and PPI, and surgery was already consulted by the daytime hospitalist.

## 2020-02-22 NOTE — Progress Notes (Signed)
Subjective: Patient had bronchoscopy yesterday w/o complications. Patient does states that he has had more dyspnea on exertion today compared to yesterday. His headache has not resolve. No fevers or chills.   Antibiotics:  Anti-infectives (From admission, onward)   Start     Dose/Rate Route Frequency Ordered Stop   02/19/20 1330  vancomycin (VANCOCIN) IVPB 1000 mg/200 mL premix  Status:  Discontinued        1,000 mg 200 mL/hr over 60 Minutes Intravenous Every 24 hours 02/19/20 1316 02/20/20 1558   02/19/20 1330  ceFEPIme (MAXIPIME) 2 g in sodium chloride 0.9 % 100 mL IVPB  Status:  Discontinued        2 g 200 mL/hr over 30 Minutes Intravenous Every 12 hours 02/19/20 1316 02/20/20 1558   02/18/20 2315  ceFEPIme (MAXIPIME) 2 g in sodium chloride 0.9 % 100 mL IVPB        2 g 200 mL/hr over 30 Minutes Intravenous  Once 02/18/20 2307 02/18/20 2356   02/18/20 2300  vancomycin (VANCOCIN) IVPB 1000 mg/200 mL premix        1,000 mg 200 mL/hr over 60 Minutes Intravenous  Once 02/18/20 2256 02/19/20 0243   02/18/20 2300  ceFEPIme (MAXIPIME) 1 g in sodium chloride 0.9 % 100 mL IVPB  Status:  Discontinued        1 g 200 mL/hr over 30 Minutes Intravenous  Once 02/18/20 2257 02/18/20 2307      Medications: Scheduled Meds: . [START ON 02/23/2020] amLODipine  10 mg Oral Daily  . amLODipine  5 mg Oral Once  . vitamin C  500 mg Oral Daily  . folic acid  1 mg Oral Daily  . melatonin  5 mg Oral QHS  . [START ON 02/23/2020] predniSONE  40 mg Oral Q breakfast  . zinc sulfate  220 mg Oral Daily   Continuous Infusions: . sodium chloride Stopped (02/19/20 1718)   PRN Meds:.sodium chloride, acetaminophen, bisacodyl, chlorpheniramine-HYDROcodone, guaiFENesin-dextromethorphan, hydrALAZINE, ipratropium-albuterol, morphine injection, ondansetron **OR** ondansetron (ZOFRAN) IV    Objective: Weight change:   Intake/Output Summary (Last 24 hours) at 02/22/2020 1214 Last data filed at 02/22/2020  0600 Gross per 24 hour  Intake 1380 ml  Output 15 ml  Net 1365 ml   Blood pressure (!) 154/89, pulse 83, temperature 97.9 F (36.6 C), temperature source Oral, resp. rate 17, height 6' (1.829 m), weight 81.9 kg, SpO2 97 %. Temp:  [97.1 F (36.2 C)-98.7 F (37.1 C)] 97.9 F (36.6 C) (01/27 0850) Pulse Rate:  [78-112] 83 (01/27 1114) Resp:  [15-20] 17 (01/27 0850) BP: (109-185)/(81-101) 154/89 (01/27 1114) SpO2:  [93 %-97 %] 97 % (01/27 0850) Weight:  [81.9 kg] 81.9 kg (01/26 1347)  Physical Exam: Physical Exam Constitutional:      Appearance: He is well-developed.  HENT:     Head: Normocephalic and atraumatic.  Cardiovascular:     Rate and Rhythm: Normal rate and regular rhythm.     Heart sounds: Normal heart sounds.  Pulmonary:     Effort: Pulmonary effort is normal.     Breath sounds: Decreased breath sounds present. No wheezing.  Chest:     Chest wall: No tenderness.  Abdominal:     General: Bowel sounds are normal.     Palpations: Abdomen is soft.  Musculoskeletal:        General: Normal range of motion.     Cervical back: Normal range of motion.  Skin:    General:  Skin is warm and dry.     Capillary Refill: Capillary refill takes less than 2 seconds.  Neurological:     General: No focal deficit present.     Mental Status: He is alert and oriented to person, place, and time.  Psychiatric:        Mood and Affect: Mood normal.      CBC:    Component Value Date/Time   WBC 14.9 (H) 02/22/2020 0721   RBC 4.66 02/22/2020 0721   HGB 12.8 (L) 02/22/2020 0721   HCT 39.0 02/22/2020 0721   PLT 336 02/22/2020 0721   MCV 83.7 02/22/2020 0721   MCH 27.5 02/22/2020 0721   MCHC 32.8 02/22/2020 0721   RDW 12.3 02/22/2020 0721   LYMPHSABS 0.6 (L) 02/22/2020 0721   MONOABS 1.3 (H) 02/22/2020 0721   EOSABS 0.0 02/22/2020 0721   BASOSABS 0.0 02/22/2020 0721      BMET Recent Labs    02/21/20 0747 02/22/20 0721  NA 134* 135  K 4.4 4.5  CL 102 101  CO2 20* 22   GLUCOSE 158* 139*  BUN 27* 33*  CREATININE 1.43* 1.59*  CALCIUM 9.2 8.9     Liver Panel  Recent Labs    02/21/20 0747 02/22/20 0721  PROT 7.1 6.5  ALBUMIN 3.4* 3.2*  AST 18 28  ALT 28 44  ALKPHOS 76 79  BILITOT 0.8 0.5       Sedimentation Rate No results for input(s): ESRSEDRATE in the last 72 hours. C-Reactive Protein Recent Labs    02/21/20 0747 02/22/20 0721  CRP 0.7 0.6    Micro Results: Recent Results (from the past 720 hour(s))  Blood culture (routine x 2)     Status: None (Preliminary result)   Collection Time: 02/18/20 10:25 PM   Specimen: BLOOD  Result Value Ref Range Status   Specimen Description BLOOD LEFT ANTECUBITAL  Final   Special Requests   Final    BOTTLES DRAWN AEROBIC AND ANAEROBIC Blood Culture adequate volume   Culture   Final    NO GROWTH 2 DAYS Performed at [facility], [address] [place], [place] [address]    Report Status PENDING  Incomplete  Blood culture (routine x 2)     Status: None (Preliminary result)   Collection Time: 02/18/20 10:30 PM   Specimen: BLOOD  Result Value Ref Range Status   Specimen Description BLOOD BLOOD RIGHT FOREARM  Final   Special Requests   Final    BOTTLES DRAWN AEROBIC AND ANAEROBIC Blood Culture adequate volume   Culture   Final    NO GROWTH 2 DAYS Performed at [facility], [place] [address] [place], [place] [address]    Report Status PENDING  Incomplete  SARS CORONAVIRUS 2 (TAT 6-24 HRS)     Status: None   Collection Time: 02/20/20  2:24 AM  Result Value Ref Range Status   SARS Coronavirus 2 NEGATIVE NEGATIVE Final    Comment: (NOTE) SARS-CoV-2 target nucleic acids are NOT DETECTED.  The SARS-CoV-2 RNA is generally detectable in upper and lower respiratory specimens during the acute phase of infection. Negative results do not preclude SARS-CoV-2 infection, do not rule out co-infections with other pathogens, and should not be used as the sole basis for treatment or other  patient management decisions. Negative results must be combined with clinical observations, patient history, and epidemiological information. The expected result is Negative.  Fact Sheet for Patients: SugarRoll.be  Fact Sheet for Healthcare  Providers: https://www.woods-mathews.com/  This test is not yet approved or cleared by the [place] and  has been authorized for detection and/or diagnosis of SARS-CoV-2 by FDA under an Emergency Use Authorization (EUA). This EUA will remain  in effect (meaning this test can be used) for the duration of the COVID-19 declaration under Se ction 564(b)(1) of the Act, 21 U.S.C. section 360bbb-3(b)(1), unless the authorization is terminated or revoked sooner.  Performed at [facility], [place] [address] [place], [place] [address]   MRSA PCR Screening     Status: None   Collection Time: 02/20/20  6:46 PM   Specimen: Nasal Mucosa; Nasopharyngeal  Result Value Ref Range Status   MRSA by PCR NEGATIVE NEGATIVE Final    Comment:        The GeneXpert MRSA Assay (FDA approved for NASAL specimens only), is one component of a comprehensive MRSA colonization surveillance program. It is not intended to diagnose MRSA infection nor to guide or monitor treatment for MRSA infections. Performed at [facility], [place] [address] [place], [place] [address]   Culture, BAL-quantitative     Status: None (Preliminary result)   Collection Time: 02/21/20  2:16 PM   Specimen: PATH Cytology washing; Lung  Result Value Ref Range Status   Specimen Description BRONCHIAL WASHINGS  Final   Special Requests LUL SAMPLE A  Final   Gram Stain   Final    FEW WBC PRESENT, PREDOMINANTLY MONONUCLEAR NO ORGANISMS SEEN    Culture   Final    NO GROWTH < 24 HOURS Performed at [facility], [place] [address] [place], [place] [address]    Report Status PENDING  Incomplete    Studies/Results: MR BRAIN W WO  CONTRAST  Result Date: 02/21/2020 CLINICAL DATA:  Headache, chronic, no new features; non-small cell lung cancer, staging. EXAM: MRI HEAD WITHOUT AND WITH CONTRAST TECHNIQUE: Multiplanar, multiecho pulse sequences of the brain and surrounding structures were obtained without and with intravenous contrast. CONTRAST:  64m GADAVIST GADOBUTROL 1 MMOL/ML IV SOLN COMPARISON:  No pertinent prior exams available for comparison. FINDINGS: Brain: Mild intermittent motion degradation. Mild cerebral and cerebellar atrophy. No abnormal intracranial enhancement is identified to suggest intracranial metastatic disease. Mild multifocal T2/FLAIR hyperintensity within the cerebral white matter is nonspecific, but compatible with chronic small vessel ischemic disease. An apparent punctate focus of T2/FLAIR hyperintensity within the left medulla is not appreciated on the axial or coronal T2 weighted sequences and is likely artifactual (series 7, image 3). Punctate focus of SWI signal loss within the right frontal lobe compatible with nonspecific chronic microhemorrhage. There is no acute infarct. No evidence of intracranial mass. No extra-axial fluid collection. No midline shift. Vascular: Expected proximal arterial flow voids. Skull and upper cervical spine: No focal marrow lesion. Sinuses/Orbits: Visualized orbits show no acute finding. Mild ethmoid sinus mucosal thickening. 1.5 cm left maxillary sinus mucous retention cyst. IMPRESSION: Mildly motion degraded examination. No intracranial metastatic disease is identified. No evidence of acute intracranial abnormality. Mild generalized atrophy of the brain and chronic small vessel ischemic disease. Nonspecific chronic microhemorrhage within the right frontal lobe. Mild ethmoid sinus mucosal thickening. 1.5 cm left maxillary sinus mucous retention cyst. Electronically Signed   By: KKellie SimmeringDO   On: 02/21/2020 18:59   DG CHEST PORT 1 VIEW  Result Date: 02/22/2020 CLINICAL DATA:   Recent COVID infection with persistent shortness of breath EXAM: PORTABLE CHEST 1 VIEW COMPARISON:  02/21/2020 FINDINGS: Cardiac shadow is  within normal limits. Lungs are well aerated bilaterally. Persistent but improved airspace opacity is noted consistent with the given clinical history. Lucency is noted beneath the right hemidiaphragm new from the previous exams suspicious for free intraperitoneal air. IMPRESSION: Persistent but improved aeration bilaterally consistent with the known history of COVID-19 positivity. Findings suggestive of free intraperitoneal air. CT of the abdomen and pelvis is recommended for further evaluation. Electronically Signed   By: Inez Catalina M.D.   On: 02/22/2020 11:22   DG CHEST PORT 1 VIEW  Result Date: 02/21/2020 CLINICAL DATA:  History of COVID-19 positivity, recent bronchoscopy EXAM: PORTABLE CHEST 1 VIEW COMPARISON:  02/18/2020 FINDINGS: Cardiac shadow is within normal limits. The lungs are hyperinflated. Cavitary lesion in the left upper lobe laterally is again seen. Known nodules are less well appreciated on this exam. No pneumothorax is noted following bronchoscopy. IMPRESSION: No evidence of post bronchoscopy pneumothorax. Electronically Signed   By: Inez Catalina M.D.   On: 02/21/2020 15:59   DG C-ARM BRONCHOSCOPY  Result Date: 02/21/2020 C-ARM BRONCHOSCOPY: Fluoroscopy was utilized by the requesting physician.  No radiographic interpretation.      Assessment/Plan:  INTERVAL HISTORY:  S/p bronchoscopy with BAL and brushing of LUL for cytology MRI brain negative for acute intracranial metastatic disease or intracranial abnormality. 1.5 cm left maxillary sinus mucous retention cyst.   Principal Problem:   Cavitary lesion of lung Active Problems:   COVID-19 virus infection   AKI (acute kidney injury) (HCC)   COPD (chronic obstructive pulmonary disease) (HCC)   History of bladder cancer   Lung nodule   Mediastinal lymphadenopathy   S/P  bronchoscopy   Corey Dixon is a 65 y.o. male with of COPD, bladder cancer status post chemo and BCG in the right nephrectomy, GERD and a recent Covid infection who presented with worsening dyspnea and found to have a cavitary lesion in pulmonary nodules on CT chest.   Remains afebrile. White count still mildly elevated, but stable.   MRI brain did not show any brain infection. BAL w/ no growth <24hr. TB unlikely, but will follow up on AFB and culture.   Will follow up blastomyces antigen, histoplasma antigen, coccidioides  Continue malignancy work up with PCCM. Will need evaluation of new DOE.   ID will continue to follow periphery but infectious work up should not be a barrier to discharge.   LOS: 2 days   Lacinda Axon 02/22/2020, 12:14 PM

## 2020-02-22 NOTE — Progress Notes (Signed)
Radiologist alerted RN that CT results did confirm that there were large amounts of free air located in the belly of the patient, MD has been notified and RN will continue to monitor this patient.

## 2020-02-22 NOTE — Consult Note (Incomplete)
Neurology Consultation  Reason for Consult: Headache Referring Physician: Dr. Niel Hummer MD  CC: Headache  History is obtained from: Patient  HPI: Corey Dixon is a 65 y.o. male with past medical history of COPD, GERD, bladder carcinoma s/p chemo and right nephrectomy presented with headache for 2 weeks. Patient was diagnosed with COVID-19 2 weeks ago and since then he has been having constant dull generalized headache.  He states the headache is 6-7/10 in intensity and it gets little better for a few hours after taking ibuprofen or Excedrin migraine but it doesn't completely go away.  He denies any increased lacrimation, nausea, vomiting ,vision changes, facial pain and pain in eyes.  Patient states the headache radiates to his neck.  He does complain of generalized weakness in the whole body.  Patient denies any history of headaches in the past but states he had few severe migraine episodes in his 20's.  He states whenever he drinks alcohol the back of his right eye hurts but it gets better with over-the-counter painkillers.  He denies any tingling, numbness in his hands and legs.  He also complains of balance issues for many years.  He denies any memory problems, vision changes, and changes in gait. He c/o mild chest pain in the rt side of chest which goes to Rt hand. EMR indicate that he lost 10 pounds wt 2 weeks before coming to the hospital. He also  lost about 4 pounds since admission. MRI brain negative for intracranial metastatic disease, shows nonspecific chronic micro hemorrhage within right frontal lobe and 1.5 cm left maxillary sinus mucous retention cyst.  BP has been running high with systolic between 676- 195 and diastolic between 09-326 mmHg. Creatinine mildly elevated to 1.59, Vitamin B12-782, WBC -14.9 For Pulmonary lesions, Primary team thinking of ?TB ?Malignancy ?infection as CT chest showed cavitary lung lesions and pulmonary nodules. Bronchoscopy done recently.  Premorbid  modified Rankin scale (mRS): 0  ROS: A 14 point ROS was performed and is negative except as noted in the HPI.   Past Medical History:  Diagnosis Date  . Bladder cancer ([place])   . COPD (chronic obstructive pulmonary disease) ([facility])    0-Completely asymptomatic and back to baseline post- stroke   History reviewed. No pertinent family history.   Social History:   reports that he has quit smoking. His smoking use included e-cigarettes. He has never used smokeless tobacco. He reports current alcohol use. No history on file for drug use. Medications  Current Facility-Administered Medications:  .  0.9 %  sodium chloride infusion, , Intravenous, PRN, Adefeso, Oladapo, DO, Stopped at 02/19/20 1718 .  acetaminophen (TYLENOL) tablet 650 mg, 650 mg, Oral, Q6H PRN, Adefeso, Oladapo, DO, 650 mg at 02/22/20 0851 .  [START ON 02/23/2020] amLODipine (NORVASC) tablet 10 mg, 10 mg, Oral, Daily, Regalado, Belkys A, MD .  amLODipine (NORVASC) tablet 5 mg, 5 mg, Oral, Once, Regalado, Belkys A, MD .  ascorbic acid (VITAMIN C) tablet 500 mg, 500 mg, Oral, Daily, Adefeso, Oladapo, DO, 500 mg at 02/22/20 0851 .  bisacodyl (DULCOLAX) EC tablet 5 mg, 5 mg, Oral, Daily PRN, Adefeso, Oladapo, DO .  chlorpheniramine-HYDROcodone (TUSSIONEX) 10-8 MG/5ML suspension 5 mL, 5 mL, Oral, Q12H PRN, Adefeso, Oladapo, DO .  folic acid (FOLVITE) tablet 1 mg, 1 mg, Oral, Daily, Regalado, Belkys A, MD, 1 mg at 02/22/20 0851 .  guaiFENesin-dextromethorphan (ROBITUSSIN DM) 100-10 MG/5ML syrup 10 mL, 10 mL, Oral, Q4H PRN, Adefeso, Oladapo, DO .  hydrALAZINE (APRESOLINE)  injection 10 mg, 10 mg, Intravenous, Q6H PRN, Regalado, Belkys A, MD .  ipratropium-albuterol (DUONEB) 0.5-2.5 (3) MG/3ML nebulizer solution 3 mL, 3 mL, Nebulization, Q4H PRN, Adefeso, Oladapo, DO .  melatonin tablet 5 mg, 5 mg, Oral, QHS, Zierle-Ghosh, Asia B, DO, 5 mg at 02/21/20 2011 .  morphine 2 MG/ML injection 2 mg, 2 mg, Intravenous, Q3H PRN, Regalado, Belkys A,  MD, 2 mg at 02/22/20 1119 .  ondansetron (ZOFRAN) tablet 4 mg, 4 mg, Oral, Q6H PRN **OR** ondansetron (ZOFRAN) injection 4 mg, 4 mg, Intravenous, Q6H PRN, Adefeso, Oladapo, DO .  [START ON 02/23/2020] predniSONE (DELTASONE) tablet 40 mg, 40 mg, Oral, Q breakfast, Regalado, Belkys A, MD .  zinc sulfate capsule 220 mg, 220 mg, Oral, Daily, Adefeso, Oladapo, DO, 220 mg at 02/22/20 2595   Exam: Current vital signs: BP (!) 154/89 (BP Location: Left Arm)   Pulse 83   Temp 97.9 F (36.6 C) (Oral)   Resp 17   Ht 6' (1.829 m)   Wt 81.9 kg   SpO2 97%   BMI 24.49 kg/m  Vital signs in last 24 hours: Temp:  [97.1 F (36.2 C)-98.7 F (37.1 C)] 97.9 F (36.6 C) (01/27 0850) Pulse Rate:  [78-112] 83 (01/27 1114) Resp:  [15-20] 17 (01/27 0850) BP: (109-185)/(81-101) 154/89 (01/27 1114) SpO2:  [93 %-97 %] 97 % (01/27 0850) Weight:  [81.9 kg] 81.9 kg (01/26 1347)  GENERAL: Awake, alert in NAD, Oriented to self, age, place, person, time and situation. HEENT: - Normocephalic and atraumatic, dry mucous membranes LUNGS - symmetrical chest rise, no labored breathing CV - No JVD, RRR, No peripheral Edema  ABDOMEN - Soft,  nondistended  Ext: warm, well perfused, no edema  NEURO:  Mental Status: AA&Ox4, Oriented to self, age, place, situation. Good Attention and Concentration Language: speech is fluent.  Pt is able to name simple objects, repetition intact,  fluency, and comprehension intact. Cranial Nerves: PERRL, EOMI, visual fields full, no facial asymmetry, facial sensation intact, hearing intact in Rt Ear. 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 tongue/uvula/soft palate midline, normal sternocleidomastoid and trapezius muscle strength. No evidence of tongue atrophy or fibrillations. Motor: No Drift in Upper Extremities, No Drift in LE's. Strength 5/5 in Rt UE, 5/5 in Lt UE, Strength in Rt LE  5/5, Lt LE 5/5 Action tremors present in UE's .  Reflexes- UE - Brisk 3+ , LE- Brisk 3+  , [place]- Down going.  Tone: is normal  and bulk is normal Sensation- Intact to light touch bilaterally. Action tremors present B/L UE's. No resting tremors.  Coordination: FTN intact bilaterally, no ataxia in BLE. Gait- deferred   Labs I have reviewed labs in epic and the results pertinent to this consultation are: Pertinent labs WBC- 14.9  Vitamin B12- 782 Creatinine 1.59   CBC    Component Value Date/Time   WBC 14.9 (H) 02/22/2020 0721   RBC 4.66 02/22/2020 0721   HGB 12.8 (L) 02/22/2020 0721   HCT 39.0 02/22/2020 0721   PLT 336 02/22/2020 0721   MCV 83.7 02/22/2020 0721   MCH 27.5 02/22/2020 0721   MCHC 32.8 02/22/2020 0721   RDW 12.3 02/22/2020 0721   LYMPHSABS 0.6 (L) 02/22/2020 0721   MONOABS 1.3 (H) 02/22/2020 0721   EOSABS 0.0 02/22/2020 0721   BASOSABS 0.0 02/22/2020 0721    CMP     Component Value Date/Time   NA 135 02/22/2020 0721   K 4.5 02/22/2020 0721   CL 101 02/22/2020 0721  CO2 22 02/22/2020 0721   GLUCOSE 139 (H) 02/22/2020 0721   BUN 33 (H) 02/22/2020 0721   CREATININE 1.59 (H) 02/22/2020 0721   CALCIUM 8.9 02/22/2020 0721   PROT 6.5 02/22/2020 0721   ALBUMIN 3.2 (L) 02/22/2020 0721   AST 28 02/22/2020 0721   ALT 44 02/22/2020 0721   ALKPHOS 79 02/22/2020 0721   BILITOT 0.5 02/22/2020 0721   GFRNONAA 48 (L) 02/22/2020 0721    Lipid Panel  No results found for: CHOL, TRIG, HDL, CHOLHDL, VLDL, LDLCALC, LDLDIRECT   Imaging I have reviewed the images obtained:  MRI examination of the brain Mildly motion degraded examination. No intracranial metastatic disease is identified. No evidence of acute intracranial abnormality. Mild generalized atrophy of the brain and chronic small vessel ischemic disease. Nonspecific chronic microhemorrhage within the right frontal lobe. Mild ethmoid sinus mucosal thickening. 1.5 cm left maxillary sinus mucous retention cyst.  Assessment: Pt is 65 year old with past medical history of COPD, GERD, bladder carcinoma s/p chemo and right  nephrectomy presented with headache for 2 weeks. Pt was found to be COVID positive.  MRI brain negative for intracranial metastatic disease, shows nonspecific chronic micro hemorrhage within right frontal lobe and 1.5 cm left maxillary sinus mucous retention cyst. Creatinine mildly elevated to 1.59, Vitamin B12-782, WBC -14.9. Pt's headache likely secondary to recent COVID illness and high blood pressure. Other differential includes tension headache, Migraine. Cluster headache was considered but less likely as the headache is generalized and absence of autonomic symptoms. Pt also has action tremors and balance issues. 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 Absence of resting tremors is reassuring and not likely Parkinson's disease.  Recommendations: ***   Dr. Armando Reichert MD PGY1 [facility]

## 2020-02-22 NOTE — Consult Note (Addendum)
Neurology Consultation Reason for Consult:"headache Referring Physician: Dr. Niel Hummer  CC: Headache  History is and he did obtained from: Patient  HPI: Corey Dixon is a 65 y.o. male with past medical history of COPD, bladder carcinoma status post chemo and right nephrectomy, recent diagnosis of Covid who presented with headache for about 2-3 weeks.  Patient states since his Covid diagnosis he has had near constant generalized pressure-like headache, about 5/10 on severity, radiating down to the back of his neck, denies any positional changes, denies nausea and vomiting, vision changes.  Of note, patient has been hypertensive with blood pressure as high as 185/99.  He also reported chest pain radiating to the right side of his arm.  He has since received morphine and his headache as well as chest pain has improved 1 to 1-2/10.  Patient also reports he has noticed intermittent tremor in both hands on movement, denies any tremor at rest.  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  He also reported mild postural instability which has been ongoing.  During discussion, patient also reported intermittent right eye pain only when he drinks alcohol that has been happening for years and improves with Excedrin.  Denies seeing an ophthalmologist or optometrist for this.   ROS: All other systems reviewed and negative except as noted in the HPI.   Past Medical History:  Diagnosis Date  . Bladder cancer ([place])   . COPD (chronic obstructive pulmonary disease) ([place])     History reviewed. No pertinent family history.  Social History:  reports that he has quit smoking. His smoking use included e-cigarettes. He has never used smokeless tobacco. He reports current alcohol use. No history on file for drug use.   Exam: Current vital signs: BP (!) 146/79   Pulse 83   Temp 97.9 F (36.6 C) (Oral)   Resp 17   Ht 6' (1.829 m)   Wt 81.9 kg   SpO2 97%   BMI 24.49 kg/m  Vital signs in  last 24 hours: Temp:  [97.1 F (36.2 C)-98.7 F (37.1 C)] 97.9 F (36.6 C) (01/27 0850) Pulse Rate:  [78-112] 83 (01/27 1114) Resp:  [16-20] 17 (01/27 0850) BP: (109-185)/(79-99) 146/79 (01/27 1252) SpO2:  [93 %-97 %] 97 % (01/27 0850)   Physical Exam  Constitutional: Appears well-developed and well-nourished.  Psych: Affect appropriate to situation Eyes: No scleral injection HENT: No OP obstrucion, tenderness in paracervical region Head: Normocephalic.  Cardiovascular: Normal rate and regular rhythm.  Respiratory: Effort normal, non-labored breathing GI: Soft.  No distension. There is no tenderness.  Skin: Warm  Neuro: AOx3, cranial anesthesia grossly intact, 5/5 in upper extremities, normal to light touch, 3+ bilateral biceps, knee jerk biceps and knee jerk, fine tremor with outstretched arms, alleviates with action   I have reviewed labs in epic and the results pertinent to this consultation are: WBC 14.9, hemoglobin 12.8, BUN 33 with creatinine 1.59, albumin 3.2.  I have reviewed the images obtained: MRI brain with and without contrast 02/21/2020: No intracranial metastatic disease is identifie. No evidence of acute intracranial abnormality. Mild generalized atrophy of the brain and chronic small vessel ischemic disease. Nonspecific chronic microhemorrhage within the right frontal lobe. Mild ethmoid sinus mucosal thickening. 1.5 cm left maxillary sinus mucous retention cyst.  ASSESSMENT/PLAN: 66 year old male with recent diagnosis of Covid presented with new onset headaches  New onset headaches -Patient does not have any nausea, vomiting, vision changes, nonpositional. - Low suspicion for venous sinus thrombosis  or any other secondary causes of headaches -Headache likely multifactorial due to hypertensive urgency, recent diagnosis of Covid (the temporal relationship with onset of headache around the same time as he was diagnosed with Covid), dehydration/AKI, cervicogenic  headache (tenderness in paracervical muscles )  Recommendations: -Management of blood pressure with goal blood pressure less than 140/90 -Okay to use Tylenol as needed for intermittent headache.  Avoid NSAIDs as patient has had nephrectomy -If headache persists after control of blood pressure can consider obtaining a  CTV to look for venous sinus thrombosis although low suspicion -No need for LP at this point as patient does not appear to have any meningismus -Patient also was noted to have mild tremor with outstretched arms which could be physiologic -Also noted to have brisk reflexes which could be secondary to cervical stenosis, will consider ordering an MRI spine. -Recommend follow-up with neurology in 8 to 12 weeks as an outpatient for better evaluation and management of tremor  Thank you for for allowing [place] to participate in the care of this patient. Neurology will follow.  Please contact with questions.  Zeb [facility]

## 2020-02-22 NOTE — Progress Notes (Signed)
NAME:  [name], MRN:  [id number], DOB:  [birth year], LOS: 2 ADMISSION DATE:  02/18/2020, CONSULTATION DATE:  02/20/20 REFERRING MD:  Tyrell Antonio  CHIEF COMPLAINT:  Cavitary Lesion, Pulmonary Nodules   Brief History   Corey Dixon is a 65 y.o. male who was admitted 1/25 with dyspnea 2/2 COVID PNA and CT chest demonstrated LUL cavitation for which PCCM was asked to weigh in on.  History of present illness   Corey Dixon is a 65 y.o. male who has a PMH including but not limited to COPD, GERD, bladder CA s/p chemo and right nephrectomy (see "past medical history" for rest).  He presented to [facility] ED 1/23 with dyspnea and increased work of breathing.  He was diagnosed with COVID 1/10 .  He reports excessive fatigue than dyspnea, no wheezing cough has been very mild and has not needed much cough syrup.  He had monitored O2 sats at home and lowest that they got were 96%.  Due to generalized weakness and fatigue, he decided to seek medical attention; hence, he presented to [facility] ED. He reports a 10 pound weight loss in the last week due to vomiting which he thinks may have been related to medication that his PCP gave him  In ED, CXR suggested LUL cavitation; therefore, CT chest was obtained.  This confirmed LUL cavitary lesion along with multiple pulmonary nodules bilaterally.  Per pt, he has has multiple negative TB tests in the past (had to test prior to getting BCG for bladder CA, last test 2019 per report). He is a former smoker.  he has a 46 pack year history and quit in 2017.  Past Medical History  has Cavitary lesion of lung; COVID-19 virus infection; AKI (acute kidney injury) ([place]); COPD (chronic obstructive pulmonary disease) ([place]); History of bladder cancer; Lung nodule; Mediastinal lymphadenopathy; and S/P bronchoscopy on their problem list.  Significant Hospital Events   1/25 > admit. 1/26 Bscopy ,EBUS  Consults:  ID, PCCM.  Procedures:  None.  Significant Diagnostic Tests:  CT  chest 1/23 > LUL cavitary mass.  Innumerable pulmonary nodules with lower lobe predominance, mediastinal adenopathy, severe centrilobular emphysema. MRI brain 1/26 >> neg  Micro Data:  Blood 1/23 > ng AFB 1/25 >  BAL afb 1/26 >> COVID 1/25 > neg COVID1/10 (per report) > positive.  Antimicrobials:  Vanc 1/23 > 1/25 Cefepime 1/23 > 1/25    Interim history/subjective:   C/o chest heaviness Rt arm pain BP high Headache continues Dyspneic walking to BR & back  Objective:  Blood pressure (!) 185/98, pulse 97, temperature 97.9 F (36.6 C), temperature source Oral, resp. rate 17, height 6' (1.829 m), weight 81.9 kg, SpO2 97 %.        Intake/Output Summary (Last 24 hours) at 02/22/2020 1050 Last data filed at 02/22/2020 0600 Gross per 24 hour  Intake 1380 ml  Output 15 ml  Net 1365 ml   Filed Weights   02/18/20 2027 02/20/20 0025 02/21/20 1347  Weight: 83.9 kg 81.9 kg 81.9 kg    Examination: Gen. Pleasant, well-nourished, in no distress ENT - no thrush, no pallor/icterus,no post nasal drip Neck: No JVD, no thyromegaly, no carotid bruits Lungs: decreased BS Lt, no rhonchi, no accessory muscle use Cardiovascular: Rhythm regular, heart sounds  normal, no murmurs or gallops, no peripheral edema Musculoskeletal: No deformities, no cyanosis or clubbing    Labs showed slight rise in cr from 1.4 to 1.6, stable leucocytosis CXR post bscopy -no pneumothx  Assessment & Plan:   LUL cavitary lesion, mediastinal lymphadenopathy and multiple pulmonary nodules.  He does not have any symptoms to suggest tuberculosis.  He does have post Covid fatigue but surprisingly no infiltrates to suggest Covid pneumonia.  He is vaccinated although not boosted.  Repeat Covid testing by PCR has been negative which is reassuring. I am more concerned about malignancy in this heavy ex-smoker.  I am not concerned about tuberculosis here.  He had BCG installed during bladder carcinoma treatment.  He has  undergone multiple negative TB test in the past  Plan for bronchoscopy/EBUS -Await results of BAL/TBNA  Emphysema -no evidence of bronchospasm Prednisone 40 mg with taper over 2 weeks  Headache -ongoing for 2 weeks, appears atypical, does not seem Covid related. MRI brain neg reassuring  Hypertension - per [facility]  We will arrange outpt FU   Labs   CBC: Recent Labs  Lab 02/18/20 2125 02/20/20 0318 02/21/20 0747 02/22/20 0721  WBC 9.5 13.4* 14.2* 14.9*  NEUTROABS 7.8*  --  13.0* 12.8*  HGB 12.0* 11.5* 12.5* 12.8*  HCT 35.4* 33.2* 38.4* 39.0  MCV 84.1 82.8 84.0 83.7  PLT 304 298 361 979   Basic Metabolic Panel: Recent Labs  Lab 02/18/20 2125 02/20/20 0318 02/21/20 0747 02/22/20 0721  NA 136 135 134* 135  K 4.1 4.4 4.4 4.5  CL 102 103 102 101  CO2 23 20* 20* 22  GLUCOSE 125* 165* 158* 139*  BUN 20 26* 27* 33*  CREATININE 2.02* 1.46* 1.43* 1.59*  CALCIUM 8.9 9.0 9.2 8.9  MG  --  1.5* 1.9 1.9  PHOS  --  3.3 3.4 2.9   GFR: Estimated Creatinine Clearance: 50.2 mL/min (A) (by C-G formula based on SCr of 1.59 mg/dL (H)). Recent Labs  Lab 02/18/20 2125 02/18/20 2234 02/20/20 0318 02/21/20 0747 02/22/20 0721  PROCALCITON  --   --  <0.10  --   --   WBC 9.5  --  13.4* 14.2* 14.9*  LATICACIDVEN  --  1.2  --   --   --    Liver Function Tests: Recent Labs  Lab 02/18/20 2125 02/20/20 0318 02/21/20 0747 02/22/20 0721  AST _0 ALT _1 44  ALKPHOS 78 75 76 79  BILITOT 0.2* 0.3 0.8 0.5  PROT 7.2 6.6 7.1 6.5  ALBUMIN 3.6 3.2* 3.4* 3.2*   No results for input(s): LIPASE, AMYLASE in the last 168 hours. No results for input(s): AMMONIA in the last 168 hours. ABG No results found for: PHART, PCO2ART, PO2ART, HCO3, TCO2, ACIDBASEDEF, O2SAT  Coagulation Profile: Recent Labs  Lab 02/20/20 0318  INR 1.1   Kara Mead MD. [facility]. [facility] See Amion for pager  If no response to pager , please call [phone] until 7 pm After  7:00 pm call Elink  [phone]     02/22/2020

## 2020-02-22 NOTE — Progress Notes (Signed)
Spoke with patient wife and she is very upset of patient's care. Explained to wife that his MD is just going in his room to discuss his care. Told patient's  wife to call his room so she be able to talk to the  MD personally.

## 2020-02-22 NOTE — Progress Notes (Addendum)
PROGRESS NOTE    Corey Dixon  AST:[id number] DOB: Aug 21, 1955 DOA: 02/18/2020 PCP: Arman Bogus., MD   Brief Narrative: 65 year old with past medical history significant for COPD, GERD, bladder cancer status post BCG who presented to [facility] due to increased work of breathing, chest pain that has been going on since his diagnosis of Covid on 1/10.  Patient had nausea and vomiting initially with the  diagnosis of Covid.  Subsequently that resolved.  He reports 10 pounds weight loss since his symptoms are started.  With worsening increased work of breathing.  Evaluation in the ED creatinine of 2.0, lactic acid 1.2.  Chest x-ray was suggestive of developing cavitary lesion in the left apex.  CT chest without contrast showed left upper lobe opacity which corresponds to a cavitary mass within left upper lobe possible representing postinflammatory cavitary lesion or a cavitary neoplasm.  AFB sputum ordered.  ID and pulmonologist has been consulted     Assessment & Plan:   Principal Problem:   Cavitary lesion of lung Active Problems:   COVID-19 virus infection   AKI (acute kidney injury) ([place])   COPD (chronic obstructive pulmonary disease) ([facility])   History of bladder cancer   Lung nodule   Mediastinal lymphadenopathy   S/P bronchoscopy  1-Cavitary lung mass; Multiples Pulmonary nodule; Infectious vs neoplasm.  ID consulted, recommend to stop antibiotics, AFB sputum and pulmonary consultation.  Recent covid infection 10 days ago. PCR in hospital negative.  Follow: Cryptococcal antigen negative , histoplasma antigen, Legionella antigen,coccidiodes, Blastomyces antigen, acid-fast smear.  Underwent Bronchoscopy 1/26.  2-COPD; continue with inhaler.  Started on prednisone taper dose.  Report dyspnea on exertion. Repeated chest x ray improvement of lung aeration.   Free air abdomen per chest x ray.  Will get Stat CT abdomen and pelvis.   3-AKI: Improved with IV fluids Cr peak to  2.0. Cr down to 1.4--1.5  4-History of bladder cancer, S.P CBG.   5-Hypomagnesemia; replaced.  6-Anemia;  Folic acid low. Started  supplement.  Iron and B 12 normal.   7-HTN; increase norvasc to 10 mg. PRN hydralazine.  8-Headache; MRI; negative for metastasis. Microhemorrhage. Neurology consulted.  Mild hyponatremia.   Chest pain, right side, radiate right arm;  Could be related post bronchoscopy.  Chest x ray negative for pneumothorax.   Estimated body mass index is 24.49 kg/m as calculated from the following:   Height as of this encounter: 6' (1.829 m).   Weight as of this encounter: 81.9 kg.   DVT prophylaxis: Lovenox Code Status: Full Code Family Communication: care discussed with patient.  Disposition Plan:  Status is: Inpatient  Remains inpatient appropriate because:Ongoing diagnostic testing needed not appropriate for outpatient work up   Dispo: The patient is from: Home              Anticipated d/c is to: Home              Anticipated d/c date is: 3 days              Patient currently is not medically stable to d/c.   Difficult to place patient No        Consultants:   Pulmonology  ID  Procedures:   None  Antimicrobials:    Subjective: He is complaining of severe headaches. He is also complaining of chest pain right side, radiate to arm.  SOB on exertion.   Objective: Vitals:   02/22/20 0850 02/22/20 0851 02/22/20 1114 02/22/20 1252  BP: (!) 185/98 (!) 185/98 (!) 154/89 (!) 146/79  Pulse: 97  83   Resp: 17     Temp: 97.9 F (36.6 C)     TempSrc: Oral     SpO2: 97%     Weight:      Height:        Intake/Output Summary (Last 24 hours) at 02/22/2020 1357 Last data filed at 02/22/2020 0600 Gross per 24 hour  Intake 1380 ml  Output 15 ml  Net 1365 ml   Filed Weights   02/18/20 2027 02/20/20 0025 02/21/20 1347  Weight: 83.9 kg 81.9 kg 81.9 kg    Examination:  General exam: mild distress Respiratory system: CTA Cardiovascular  system:  S 1, S 2 RRR Gastrointestinal system: BS present ,, soft, nt Central nervous system: alert  Extremities: no edema   Data Reviewed: I have personally reviewed following labs and imaging studies  CBC: Recent Labs  Lab 02/18/20 2125 02/20/20 0318 02/21/20 0747 02/22/20 0721  WBC 9.5 13.4* 14.2* 14.9*  NEUTROABS 7.8*  --  13.0* 12.8*  HGB 12.0* 11.5* 12.5* 12.8*  HCT 35.4* 33.2* 38.4* 39.0  MCV 84.1 82.8 84.0 83.7  PLT 304 298 361 308   Basic Metabolic Panel: Recent Labs  Lab 02/18/20 2125 02/20/20 0318 02/21/20 0747 02/22/20 0721  NA 136 135 134* 135  K 4.1 4.4 4.4 4.5  CL 102 103 102 101  CO2 23 20* 20* 22  GLUCOSE 125* 165* 158* 139*  BUN 20 26* 27* 33*  CREATININE 2.02* 1.46* 1.43* 1.59*  CALCIUM 8.9 9.0 9.2 8.9  MG  --  1.5* 1.9 1.9  PHOS  --  3.3 3.4 2.9   GFR: Estimated Creatinine Clearance: 50.2 mL/min (A) (by C-G formula based on SCr of 1.59 mg/dL (H)). Liver Function Tests: Recent Labs  Lab 02/18/20 2125 02/20/20 0318 02/21/20 0747 02/22/20 0721  AST _0 ALT _1 44  ALKPHOS 78 75 76 79  BILITOT 0.2* 0.3 0.8 0.5  PROT 7.2 6.6 7.1 6.5  ALBUMIN 3.6 3.2* 3.4* 3.2*   No results for input(s): LIPASE, AMYLASE in the last 168 hours. No results for input(s): AMMONIA in the last 168 hours. Coagulation Profile: Recent Labs  Lab 02/20/20 0318  INR 1.1   Cardiac Enzymes: No results for input(s): CKTOTAL, CKMB, CKMBINDEX, TROPONINI in the last 168 hours. BNP (last 3 results) No results for input(s): PROBNP in the last 8760 hours. HbA1C: No results for input(s): HGBA1C in the last 72 hours. CBG: No results for input(s): GLUCAP in the last 168 hours. Lipid Profile: No results for input(s): CHOL, HDL, LDLCALC, TRIG, CHOLHDL, LDLDIRECT in the last 72 hours. Thyroid Function Tests: No results for input(s): TSH, T4TOTAL, FREET4, T3FREE, THYROIDAB in the last 72 hours. Anemia Panel: Recent Labs    02/21/20 0747 02/22/20 0721   VITAMINB12 782  --   FOLATE 3.4*  --   FERRITIN 200 208  TIBC 364  --   IRON 111  --   RETICCTPCT 1.8  --    Sepsis Labs: Recent Labs  Lab 02/18/20 2234 02/20/20 0318  PROCALCITON  --  <0.10  LATICACIDVEN 1.2  --     Recent Results (from the past 240 hour(s))  Blood culture (routine x 2)     Status: None (Preliminary result)   Collection Time: 02/18/20 10:25 PM   Specimen: BLOOD  Result Value Ref Range Status   Specimen Description BLOOD LEFT ANTECUBITAL  Final  Special Requests   Final    BOTTLES DRAWN AEROBIC AND ANAEROBIC Blood Culture adequate volume   Culture   Final    NO GROWTH 2 DAYS Performed at [facility], [place] [address] [place], [place] [address]    Report Status PENDING  Incomplete  Blood culture (routine x 2)     Status: None (Preliminary result)   Collection Time: 02/18/20 10:30 PM   Specimen: BLOOD  Result Value Ref Range Status   Specimen Description BLOOD BLOOD RIGHT FOREARM  Final   Special Requests   Final    BOTTLES DRAWN AEROBIC AND ANAEROBIC Blood Culture adequate volume   Culture   Final    NO GROWTH 2 DAYS Performed at [facility], [place] [address] [place], [place] [address]    Report Status PENDING  Incomplete  SARS CORONAVIRUS 2 (TAT 6-24 HRS)     Status: None   Collection Time: 02/20/20  2:24 AM  Result Value Ref Range Status   SARS Coronavirus 2 NEGATIVE NEGATIVE Final    Comment: (NOTE) SARS-CoV-2 target nucleic acids are NOT DETECTED.  The SARS-CoV-2 RNA is generally detectable in upper and lower respiratory specimens during the acute phase of infection. Negative results do not preclude SARS-CoV-2 infection, do not rule out co-infections with other pathogens, and should not be used as the sole basis for treatment or other patient management decisions. Negative results must be combined with clinical observations, patient history, and epidemiological information. The expected result is Negative.  Fact Sheet for  Patients: SugarRoll.be  Fact Sheet for Healthcare Providers: https://www.woods-mathews.com/  This test is not yet approved or cleared by the [place] FDA and  has been authorized for detection and/or diagnosis of SARS-CoV-2 by FDA under an Emergency Use Authorization (EUA). This EUA will remain  in effect (meaning this test can be used) for the duration of the COVID-19 declaration under Se ction 564(b)(1) of the Act, 21 U.S.C. section 360bbb-3(b)(1), unless the authorization is terminated or revoked sooner.  Performed at [facility], [place] [address], [place], [place] [address]   MRSA PCR Screening     Status: None   Collection Time: 02/20/20  6:46 PM   Specimen: Nasal Mucosa; Nasopharyngeal  Result Value Ref Range Status   MRSA by PCR NEGATIVE NEGATIVE Final    Comment:        The GeneXpert MRSA Assay (FDA approved for NASAL specimens only), is one component of a comprehensive MRSA colonization surveillance program. It is not intended to diagnose MRSA infection nor to guide or monitor treatment for MRSA infections. Performed at [place] [facility], [place] [address] [place], [place] [address]   Culture, BAL-quantitative     Status: None (Preliminary result)   Collection Time: 02/21/20  2:16 PM   Specimen: PATH Cytology washing; Lung  Result Value Ref Range Status   Specimen Description BRONCHIAL WASHINGS  Final   Special Requests LUL SAMPLE A  Final   Gram Stain   Final    FEW WBC PRESENT, PREDOMINANTLY MONONUCLEAR NO ORGANISMS SEEN    Culture   Final    NO GROWTH < 24 HOURS Performed at [facility], [place] [address], [place], [place] [address]    Report Status PENDING  Incomplete         Radiology Studies: MR BRAIN W WO CONTRAST  Result Date: 02/21/2020 CLINICAL DATA:  Headache, chronic, no new features; non-small cell lung cancer, staging. EXAM: MRI HEAD WITHOUT  AND WITH CONTRAST TECHNIQUE: Multiplanar,  multiecho pulse sequences of the brain and surrounding structures were obtained without and with intravenous contrast. CONTRAST:  90m GADAVIST GADOBUTROL 1 MMOL/ML IV SOLN COMPARISON:  No pertinent prior exams available for comparison. FINDINGS: Brain: Mild intermittent motion degradation. Mild cerebral and cerebellar atrophy. No abnormal intracranial enhancement is identified to suggest intracranial metastatic disease. Mild multifocal T2/FLAIR hyperintensity within the cerebral white matter is nonspecific, but compatible with chronic small vessel ischemic disease. An apparent punctate focus of T2/FLAIR hyperintensity within the left medulla is not appreciated on the axial or coronal T2 weighted sequences and is likely artifactual (series 7, image 3). Punctate focus of SWI signal loss within the right frontal lobe compatible with nonspecific chronic microhemorrhage. There is no acute infarct. No evidence of intracranial mass. No extra-axial fluid collection. No midline shift. Vascular: Expected proximal arterial flow voids. Skull and upper cervical spine: No focal marrow lesion. Sinuses/Orbits: Visualized orbits show no acute finding. Mild ethmoid sinus mucosal thickening. 1.5 cm left maxillary sinus mucous retention cyst. IMPRESSION: Mildly motion degraded examination. No intracranial metastatic disease is identified. No evidence of acute intracranial abnormality. Mild generalized atrophy of the brain and chronic small vessel ischemic disease. Nonspecific chronic microhemorrhage within the right frontal lobe. Mild ethmoid sinus mucosal thickening. 1.5 cm left maxillary sinus mucous retention cyst. Electronically Signed   By: KKellie SimmeringDO   On: 02/21/2020 18:59   DG CHEST PORT 1 VIEW  Result Date: 02/22/2020 CLINICAL DATA:  Recent COVID infection with persistent shortness of breath EXAM: PORTABLE CHEST 1 VIEW COMPARISON:  02/21/2020 FINDINGS: Cardiac shadow is within normal limits. Lungs are well aerated  bilaterally. Persistent but improved airspace opacity is noted consistent with the given clinical history. Lucency is noted beneath the right hemidiaphragm new from the previous exams suspicious for free intraperitoneal air. IMPRESSION: Persistent but improved aeration bilaterally consistent with the known history of COVID-19 positivity. Findings suggestive of free intraperitoneal air. CT of the abdomen and pelvis is recommended for further evaluation. Electronically Signed   By: MInez CatalinaM.D.   On: 02/22/2020 11:22   DG CHEST PORT 1 VIEW  Result Date: 02/21/2020 CLINICAL DATA:  History of COVID-19 positivity, recent bronchoscopy EXAM: PORTABLE CHEST 1 VIEW COMPARISON:  02/18/2020 FINDINGS: Cardiac shadow is within normal limits. The lungs are hyperinflated. Cavitary lesion in the left upper lobe laterally is again seen. Known nodules are less well appreciated on this exam. No pneumothorax is noted following bronchoscopy. IMPRESSION: No evidence of post bronchoscopy pneumothorax. Electronically Signed   By: MInez CatalinaM.D.   On: 02/21/2020 15:59   DG C-ARM BRONCHOSCOPY  Result Date: 02/21/2020 C-ARM BRONCHOSCOPY: Fluoroscopy was utilized by the requesting physician.  No radiographic interpretation.        Scheduled Meds: . [START ON 02/23/2020] amLODipine  10 mg Oral Daily  . vitamin C  500 mg Oral Daily  . folic acid  1 mg Oral Daily  . melatonin  5 mg Oral QHS  . [START ON 02/23/2020] predniSONE  40 mg Oral Q breakfast  . zinc sulfate  220 mg Oral Daily   Continuous Infusions: . sodium chloride Stopped (02/19/20 1718)     LOS: 2 days    Time spent: 35 minutes    Tametra Ahart A Adia Crammer, MD [facility]   If 7PM-7AM, please contact night-coverage [email or web address]  02/22/2020, 1:57 PM

## 2020-02-23 ENCOUNTER — Encounter (HOSPITAL_COMMUNITY)
Admission: EM | Disposition: A | Discharge status: Home / Self Care | Payer: Self-pay | Source: Home / Self Care | Attending: Internal Medicine

## 2020-02-23 ENCOUNTER — Inpatient Hospital Stay (HOSPITAL_COMMUNITY): Payer: Medicare Other | Admitting: Anesthesiology

## 2020-02-23 ENCOUNTER — Other Ambulatory Visit: Payer: Self-pay

## 2020-02-23 ENCOUNTER — Inpatient Hospital Stay (HOSPITAL_COMMUNITY): Payer: Medicare Other

## 2020-02-23 ENCOUNTER — Encounter (HOSPITAL_COMMUNITY): Payer: Self-pay | Admitting: Family Medicine

## 2020-02-23 DIAGNOSIS — J984 Other disorders of lung: Secondary | ICD-10-CM

## 2020-02-23 HISTORY — PX: LAPAROSCOPY: SHX197

## 2020-02-23 HISTORY — PX: LIVER BIOPSY: SHX301

## 2020-02-23 LAB — CBC WITH DIFFERENTIAL/PLATELET
Abs Immature Granulocytes: 0.22 10*3/uL — ABNORMAL HIGH (ref 0.00–0.07)
Basophils Absolute: 0 10*3/uL (ref 0.0–0.1)
Basophils Relative: 0 %
Eosinophils Absolute: 0 10*3/uL (ref 0.0–0.5)
Eosinophils Relative: 0 %
HCT: 39.6 % (ref 39.0–52.0)
Hemoglobin: 13.1 g/dL (ref 13.0–17.0)
Immature Granulocytes: 2 %
Lymphocytes Relative: 9 %
Lymphs Abs: 1.2 10*3/uL (ref 0.7–4.0)
MCH: 27.9 pg (ref 26.0–34.0)
MCHC: 33.1 g/dL (ref 30.0–36.0)
MCV: 84.3 fL (ref 80.0–100.0)
Monocytes Absolute: 1.3 10*3/uL — ABNORMAL HIGH (ref 0.1–1.0)
Monocytes Relative: 9 %
Neutro Abs: 11 10*3/uL — ABNORMAL HIGH (ref 1.7–7.7)
Neutrophils Relative %: 80 %
Platelets: 343 10*3/uL (ref 150–400)
RBC: 4.7 MIL/uL (ref 4.22–5.81)
RDW: 12.4 % (ref 11.5–15.5)
WBC: 13.7 10*3/uL — ABNORMAL HIGH (ref 4.0–10.5)
nRBC: 0 % (ref 0.0–0.2)

## 2020-02-23 LAB — BLASTOMYCES ANTIGEN: Blastomyces Antigen: NOT DETECTED ng/mL

## 2020-02-23 LAB — COMPREHENSIVE METABOLIC PANEL
ALT: 36 U/L (ref 0–44)
AST: 19 U/L (ref 15–41)
Albumin: 3.3 g/dL — ABNORMAL LOW (ref 3.5–5.0)
Alkaline Phosphatase: 76 U/L (ref 38–126)
Anion gap: 12 (ref 5–15)
BUN: 30 mg/dL — ABNORMAL HIGH (ref 8–23)
CO2: 24 mmol/L (ref 22–32)
Calcium: 9.2 mg/dL (ref 8.9–10.3)
Chloride: 100 mmol/L (ref 98–111)
Creatinine, Ser: 1.49 mg/dL — ABNORMAL HIGH (ref 0.61–1.24)
GFR, Estimated: 51 mL/min — ABNORMAL LOW (ref >60)
Glucose, Bld: 102 mg/dL — ABNORMAL HIGH (ref 70–99)
Potassium: 4.3 mmol/L (ref 3.5–5.1)
Sodium: 136 mmol/L (ref 135–145)
Total Bilirubin: 0.9 mg/dL (ref 0.3–1.2)
Total Protein: 6.6 g/dL (ref 6.5–8.1)

## 2020-02-23 LAB — CULTURE, BAL-QUANTITATIVE W GRAM STAIN: Culture: NO GROWTH

## 2020-02-23 LAB — LACTIC ACID, PLASMA: Lactic Acid, Venous: 1.2 mmol/L (ref 0.5–1.9)

## 2020-02-23 LAB — TYPE AND SCREEN
ABO/RH(D): A POS
Antibody Screen: NEGATIVE

## 2020-02-23 LAB — CYTOLOGY - NON PAP

## 2020-02-23 LAB — ABO/RH: ABO/RH(D): A POS

## 2020-02-23 SURGERY — BIOPSY, LIVER
Anesthesia: General

## 2020-02-23 MED ORDER — PHENYLEPHRINE HCL-NACL 10-0.9 MG/250ML-% IV SOLN
INTRAVENOUS | Status: DC | PRN
  Administered 2020-02-23: 30 ug/min via INTRAVENOUS

## 2020-02-23 MED ORDER — 0.9 % SODIUM CHLORIDE (POUR BTL) OPTIME
TOPICAL | Status: DC | PRN
  Administered 2020-02-23: 1000 mL

## 2020-02-23 MED ORDER — ALBUMIN HUMAN 5 % IV SOLN: INTRAVENOUS | Status: DC | PRN

## 2020-02-23 MED ORDER — MIDAZOLAM HCL 5 MG/5ML IJ SOLN
INTRAMUSCULAR | Status: DC | PRN
  Administered 2020-02-23: 2 mg via INTRAVENOUS

## 2020-02-23 MED ORDER — CHLORHEXIDINE GLUCONATE 0.12 % MT SOLN
OROMUCOSAL | Status: AC
  Administered 2020-02-23: 15 mL via OROMUCOSAL
  Filled 2020-02-23: qty 15

## 2020-02-23 MED ORDER — PHENYLEPHRINE 40 MCG/ML (10ML) SYRINGE FOR IV PUSH (FOR BLOOD PRESSURE SUPPORT)
PREFILLED_SYRINGE | INTRAVENOUS | Status: DC | PRN
  Administered 2020-02-23: 120 ug via INTRAVENOUS

## 2020-02-23 MED ORDER — ALBUTEROL SULFATE HFA 108 (90 BASE) MCG/ACT IN AERS
INHALATION_SPRAY | RESPIRATORY_TRACT | Status: DC | PRN
  Administered 2020-02-23: 2 via RESPIRATORY_TRACT

## 2020-02-23 MED ORDER — ROCURONIUM BROMIDE 10 MG/ML (PF) SYRINGE
PREFILLED_SYRINGE | INTRAVENOUS | Status: DC | PRN
  Administered 2020-02-23: 20 mg via INTRAVENOUS
  Administered 2020-02-23: 50 mg via INTRAVENOUS

## 2020-02-23 MED ORDER — CHLORHEXIDINE GLUCONATE 0.12 % MT SOLN: 15.0000 mL | Freq: Once | OROMUCOSAL | Status: AC

## 2020-02-23 MED ORDER — ALBUTEROL SULFATE HFA 108 (90 BASE) MCG/ACT IN AERS
INHALATION_SPRAY | RESPIRATORY_TRACT | Status: AC
  Filled 2020-02-23: qty 6.7

## 2020-02-23 MED ORDER — ONDANSETRON HCL 4 MG/2ML IJ SOLN
INTRAMUSCULAR | Status: DC | PRN
  Administered 2020-02-23: 4 mg via INTRAVENOUS

## 2020-02-23 MED ORDER — PROPOFOL 10 MG/ML IV BOLUS
INTRAVENOUS | Status: DC | PRN
  Administered 2020-02-23: 190 mg via INTRAVENOUS

## 2020-02-23 MED ORDER — MIDAZOLAM HCL 2 MG/2ML IJ SOLN
INTRAMUSCULAR | Status: AC
  Filled 2020-02-23: qty 2

## 2020-02-23 MED ORDER — FENTANYL CITRATE (PF) 250 MCG/5ML IJ SOLN
INTRAMUSCULAR | Status: DC | PRN
  Administered 2020-02-23: 100 ug via INTRAVENOUS

## 2020-02-23 MED ORDER — HYDRALAZINE HCL 25 MG PO TABS
25.0000 mg | ORAL_TABLET | Freq: Two times a day (BID) | ORAL | Status: DC
  Administered 2020-02-23 – 2020-02-25 (×4): 25 mg via ORAL
  Filled 2020-02-23 (×6): qty 1

## 2020-02-23 MED ORDER — NICOTINE 14 MG/24HR TD PT24
14.0000 mg | MEDICATED_PATCH | Freq: Every day | TRANSDERMAL | Status: DC
  Administered 2020-02-23 – 2020-02-25 (×3): 14 mg via TRANSDERMAL
  Filled 2020-02-23 (×3): qty 1

## 2020-02-23 MED ORDER — LIDOCAINE 2% (20 MG/ML) 5 ML SYRINGE
INTRAMUSCULAR | Status: DC | PRN
  Administered 2020-02-23: 100 mg via INTRAVENOUS

## 2020-02-23 MED ORDER — IPRATROPIUM-ALBUTEROL 0.5-2.5 (3) MG/3ML IN SOLN
3.0000 mL | Freq: Four times a day (QID) | RESPIRATORY_TRACT | Status: DC
  Administered 2020-02-23 (×2): 3 mL via RESPIRATORY_TRACT
  Filled 2020-02-23 (×2): qty 3

## 2020-02-23 MED ORDER — LACTATED RINGERS IV SOLN: INTRAVENOUS | Status: DC

## 2020-02-23 MED ORDER — PROPOFOL 10 MG/ML IV BOLUS
INTRAVENOUS | Status: AC
  Filled 2020-02-23: qty 40

## 2020-02-23 MED ORDER — ESCITALOPRAM OXALATE 20 MG PO TABS: 20.0000 mg | ORAL_TABLET | Freq: Every day | ORAL | Status: DC

## 2020-02-23 MED ORDER — PHENOL 1.4 % MT LIQD
1.0000 | OROMUCOSAL | Status: DC | PRN
  Administered 2020-02-23: 1 via OROMUCOSAL
  Filled 2020-02-23: qty 177

## 2020-02-23 MED ORDER — LIDOCAINE HCL 1 % IJ SOLN
INTRAMUSCULAR | Status: DC | PRN
  Administered 2020-02-23: 4 mL via INTRAMUSCULAR

## 2020-02-23 MED ORDER — ALPRAZOLAM 0.25 MG PO TABS
0.2500 mg | ORAL_TABLET | Freq: Two times a day (BID) | ORAL | Status: DC | PRN
  Administered 2020-02-23 – 2020-02-24 (×2): 0.25 mg via ORAL
  Filled 2020-02-23 (×2): qty 1

## 2020-02-23 MED ORDER — ESCITALOPRAM OXALATE 20 MG PO TABS
20.0000 mg | ORAL_TABLET | Freq: Every day | ORAL | Status: DC
Start: 2020-02-24 — End: 2020-02-25
  Administered 2020-02-24 – 2020-02-25 (×2): 20 mg via ORAL
  Filled 2020-02-23 (×2): qty 1

## 2020-02-23 MED ORDER — DEXAMETHASONE SODIUM PHOSPHATE 10 MG/ML IJ SOLN
INTRAMUSCULAR | Status: DC | PRN
  Administered 2020-02-23: 10 mg via INTRAVENOUS

## 2020-02-23 MED ORDER — SUCCINYLCHOLINE CHLORIDE 200 MG/10ML IV SOSY
PREFILLED_SYRINGE | INTRAVENOUS | Status: DC | PRN
  Administered 2020-02-23: 140 mg via INTRAVENOUS

## 2020-02-23 MED ORDER — ALUM & MAG HYDROXIDE-SIMETH 200-200-20 MG/5ML PO SUSP: 15.0000 mL | Freq: Four times a day (QID) | ORAL | Status: DC | PRN

## 2020-02-23 MED ORDER — FENTANYL CITRATE (PF) 250 MCG/5ML IJ SOLN
INTRAMUSCULAR | Status: AC
  Filled 2020-02-23: qty 5

## 2020-02-23 MED ORDER — BUPIVACAINE-EPINEPHRINE (PF) 0.25% -1:200000 IJ SOLN
INTRAMUSCULAR | Status: AC
  Filled 2020-02-23: qty 20

## 2020-02-23 MED ORDER — SUGAMMADEX SODIUM 200 MG/2ML IV SOLN
INTRAVENOUS | Status: DC | PRN
  Administered 2020-02-23: 200 mg via INTRAVENOUS

## 2020-02-23 MED ORDER — LIDOCAINE HCL 1 % IJ SOLN
INTRAMUSCULAR | Status: AC
  Filled 2020-02-23: qty 20

## 2020-02-23 MED ORDER — FLUTICASONE-SALMETEROL 100-50 MCG/DOSE IN AEPB
1.0000 | INHALATION_SPRAY | Freq: Two times a day (BID) | RESPIRATORY_TRACT | Status: DC
  Administered 2020-02-23 – 2020-02-25 (×3): 1 via RESPIRATORY_TRACT
  Filled 2020-02-23: qty 14

## 2020-02-23 SURGICAL SUPPLY — 53 items
BLADE CLIPPER SURG (BLADE) IMPLANT
CANISTER SUCT 3000ML PPV (MISCELLANEOUS) ×3 IMPLANT
CHLORAPREP W/TINT 26 (MISCELLANEOUS) ×3 IMPLANT
COVER SURGICAL LIGHT HANDLE (MISCELLANEOUS) ×3 IMPLANT
COVER WAND RF STERILE (DRAPES) ×3 IMPLANT
DECANTER SPIKE VIAL GLASS SM (MISCELLANEOUS) IMPLANT
DERMABOND ADVANCED (GAUZE/BANDAGES/DRESSINGS) ×1
DERMABOND ADVANCED .7 DNX12 (GAUZE/BANDAGES/DRESSINGS) ×2 IMPLANT
DRAPE LAPAROSCOPIC ABDOMINAL (DRAPES) ×3 IMPLANT
DRAPE WARM FLUID 44X44 (DRAPES) ×3 IMPLANT
DRSG OPSITE POSTOP 4X10 (GAUZE/BANDAGES/DRESSINGS) IMPLANT
DRSG OPSITE POSTOP 4X8 (GAUZE/BANDAGES/DRESSINGS) IMPLANT
DRSG TELFA 3X8 NADH (GAUZE/BANDAGES/DRESSINGS) ×3 IMPLANT
ELECT BLADE 6.5 EXT (BLADE) IMPLANT
ELECT CAUTERY BLADE 6.4 (BLADE) ×3 IMPLANT
ELECT REM PT RETURN 9FT ADLT (ELECTROSURGICAL) ×3
ELECTRODE REM PT RTRN 9FT ADLT (ELECTROSURGICAL) ×2 IMPLANT
GLOVE BIO SURGEON STRL SZ 6 (GLOVE) ×3 IMPLANT
GLOVE SURG UNDER LTX SZ6.5 (GLOVE) ×3 IMPLANT
GOWN STRL REUS W/ TWL LRG LVL3 (GOWN DISPOSABLE) ×6 IMPLANT
GOWN STRL REUS W/TWL LRG LVL3 (GOWN DISPOSABLE) ×3
HANDLE SUCTION POOLE (INSTRUMENTS) ×2 IMPLANT
KIT BASIN OR (CUSTOM PROCEDURE TRAY) ×3 IMPLANT
KIT TURNOVER KIT B (KITS) ×3 IMPLANT
LIGASURE IMPACT 36 18CM CVD LR (INSTRUMENTS) IMPLANT
NEEDLE BIOPSY 14X6 SOFT TISS (NEEDLE) ×3 IMPLANT
NEEDLE INSUFFLATION 14GA 120MM (NEEDLE) ×3 IMPLANT
NS IRRIG 1000ML POUR BTL (IV SOLUTION) ×6 IMPLANT
PACK GENERAL/GYN (CUSTOM PROCEDURE TRAY) ×3 IMPLANT
PAD ARMBOARD 7.5X6 YLW CONV (MISCELLANEOUS) ×6 IMPLANT
PENCIL SMOKE EVACUATOR (MISCELLANEOUS) ×3 IMPLANT
SCISSORS LAP 5X35 DISP (ENDOMECHANICALS) ×3 IMPLANT
SET IRRIG TUBING LAPAROSCOPIC (IRRIGATION / IRRIGATOR) ×3 IMPLANT
SET TUBE SMOKE EVAC HIGH FLOW (TUBING) ×3 IMPLANT
SLEEVE ENDOPATH XCEL 5M (ENDOMECHANICALS) ×6 IMPLANT
SPECIMEN JAR LARGE (MISCELLANEOUS) IMPLANT
SPONGE LAP 18X18 RF (DISPOSABLE) IMPLANT
STAPLER VISISTAT 35W (STAPLE) IMPLANT
SUCTION POOLE HANDLE (INSTRUMENTS) ×3
SUT MNCRL AB 4-0 PS2 18 (SUTURE) ×3 IMPLANT
SUT PDS AB 1 TP1 96 (SUTURE) IMPLANT
SUT SILK 2 0 SH CR/8 (SUTURE) IMPLANT
SUT SILK 2 0 TIES 10X30 (SUTURE) IMPLANT
SUT SILK 3 0 SH CR/8 (SUTURE) IMPLANT
SUT SILK 3 0 TIES 10X30 (SUTURE) IMPLANT
SUT VIC AB 3-0 SH 18 (SUTURE) IMPLANT
TOWEL GREEN STERILE (TOWEL DISPOSABLE) ×3 IMPLANT
TOWEL GREEN STERILE FF (TOWEL DISPOSABLE) ×3 IMPLANT
TRAY FOLEY MTR SLVR 16FR STAT (SET/KITS/TRAYS/PACK) ×3 IMPLANT
TRAY LAPAROSCOPIC MC (CUSTOM PROCEDURE TRAY) ×3 IMPLANT
TROCAR XCEL BLUNT TIP 100MML (ENDOMECHANICALS) IMPLANT
TROCAR XCEL NON-BLD 11X100MML (ENDOMECHANICALS) IMPLANT
TROCAR XCEL NON-BLD 5MMX100MML (ENDOMECHANICALS) ×3 IMPLANT

## 2020-02-23 NOTE — Progress Notes (Signed)
NAME:  MARGARITA BOBROWSKI, MRN:  [id number], DOB:  06-07-1955, LOS: 3 ADMISSION DATE:  02/18/2020, CONSULTATION DATE:  02/20/20 REFERRING MD:  Tyrell Antonio  CHIEF COMPLAINT:  Cavitary Lesion, Pulmonary Nodules   Brief History   MUBARAK BEVENS is a 65 y.o. male who was admitted 1/25 with dyspnea 2/2 COVID PNA and CT chest demonstrated LUL cavitation for which PCCM was asked to weigh in on.  History of present illness   THURSTON BRENDLINGER is a 65 y.o. male who has a PMH including but not limited to COPD, GERD, bladder CA s/p chemo and right nephrectomy (see "past medical history" for rest).  He presented to [facility] ED 1/23 with dyspnea and increased work of breathing.  He was diagnosed with COVID 1/10 .  He reports excessive fatigue than dyspnea, no wheezing cough has been very mild and has not needed much cough syrup.  He had monitored O2 sats at home and lowest that they got were 96%.  Due to generalized weakness and fatigue, he decided to seek medical attention; hence, he presented to [facility] ED. He reports a 10 pound weight loss in the last week due to vomiting which he thinks may have been related to medication that his PCP gave him  In ED, CXR suggested LUL cavitation; therefore, CT chest was obtained.  This confirmed LUL cavitary lesion along with multiple pulmonary nodules bilaterally.  Per pt, he has has multiple negative TB tests in the past (had to test prior to getting BCG for bladder CA, last test 2019 per report). He is a former smoker.  he has a 46 pack year history and quit in 2017.  Past Medical History  has Cavitary lesion of lung; COVID-19 virus infection; AKI (acute kidney injury) ([place]); COPD (chronic obstructive pulmonary disease) ([place]); History of bladder cancer; Lung nodule; Mediastinal lymphadenopathy; and S/P bronchoscopy on their problem list.  Significant Hospital Events   1/25 > admit. 1/26 Bscopy ,EBUS 1/27 C/o chest heaviness ,Rt arm pain 'BP high  Consults:  ID, PCCM.  Procedures:   None.  Significant Diagnostic Tests:  CT chest 1/23 > LUL cavitary mass.  Innumerable pulmonary nodules with lower lobe predominance, mediastinal adenopathy, severe centrilobular emphysema. MRI brain 1/26 >> neg 1/27 >>CT abdomen  free air, 17 mm liver lesion  Micro Data:  Blood 1/23 > ng AFB 1/25 > neg BAL afb 1/26 >> COVID 1/25 > neg COVID1/10 (per report) > positive.  Antimicrobials:  Vanc 1/23 > 1/25 Cefepime 1/23 > 1/25    Interim history/subjective:   Events overnight noted Chest x-ray from 1/27 showed free air under diaphragm which was confirmed on CT abdomen, surgery consult reviewed.  He was taken to the OR  Objective:  Blood pressure 130/80, pulse 88, temperature (!) 97.4 F (36.3 C), temperature source Oral, resp. rate 16, height 6' (1.829 m), weight 81.9 kg, SpO2 93 %.        Intake/Output Summary (Last 24 hours) at 02/23/2020 1621 Last data filed at 02/23/2020 1300 Gross per 24 hour  Intake 3682.83 ml  Output 610 ml  Net 3072.83 ml   Filed Weights   02/18/20 2027 02/20/20 0025 02/21/20 1347  Weight: 83.9 kg 81.9 kg 81.9 kg    Examination: Gen. Pleasant, well-nourished, in no distress, receiving neb ENT - no thrush, no pallor/icterus,no post nasal drip Neck: No JVD, no thyromegaly, no carotid bruits Lungs: Decreased breath sounds bilateral, no accessory muscle use Cardiovascular: Rhythm regular, heart sounds  normal, no murmurs  or gallops, no peripheral edema Musculoskeletal: No deformities, no cyanosis or clubbing    CT abdomen reviewed, free air, 17 mm liver lesion   Assessment & Plan:   LUL cavitary lesion, mediastinal lymphadenopathy and multiple pulmonary nodules.  He does not have any symptoms to suggest tuberculosis.  He does have post Covid fatigue but surprisingly no infiltrates to suggest Covid pneumonia.  He is vaccinated although not boosted.  Repeat Covid testing by PCR has been negative which is reassuring.  I am not concerned about  tuberculosis here.  He had BCG installed during bladder carcinoma treatment.  He has undergone multiple negative TB test in the past.  BAL for AFB is negative and we can DC airborne precautions  -I discussed with pathology, brushings from left upper lobe and BAL shows atypical cells.  TB NA of 4-R and 4-L shows non-small cell lung cancer -I gave him the results of biopsy, stage is at least 3P and if liver and bone lesions are metastases then this would be stage IV.  He would need staging PET scan as outpatient.  He would like to follow-up with his oncologist, Dr. Theda Sers from [facility]  Emphysema -no evidence of bronchospasm Prednisone 40 mg with taper over 2 weeks  Headache -ongoing for 2 weeks, appears atypical, does not seem Covid related. MRI brain neg reassuring  Hypertension - per [facility]  PCCM will be available as needed   Labs   CBC: Recent Labs  Lab 02/18/20 2125 02/20/20 0318 02/21/20 0747 02/22/20 0721 02/23/20 0453  WBC 9.5 13.4* 14.2* 14.9* 13.7*  NEUTROABS 7.8*  --  13.0* 12.8* 11.0*  HGB 12.0* 11.5* 12.5* 12.8* 13.1  HCT 35.4* 33.2* 38.4* 39.0 39.6  MCV 84.1 82.8 84.0 83.7 84.3  PLT 304 298 361 336 034   Basic Metabolic Panel: Recent Labs  Lab 02/18/20 2125 02/20/20 0318 02/21/20 0747 02/22/20 0721 02/23/20 0453  NA 136 135 134* 135 136  K 4.1 4.4 4.4 4.5 4.3  CL 102 103 102 101 100  CO2 23 20* 20* 22 24  GLUCOSE 125* 165* 158* 139* 102*  BUN 20 26* 27* 33* 30*  CREATININE 2.02* 1.46* 1.43* 1.59* 1.49*  CALCIUM 8.9 9.0 9.2 8.9 9.2  MG  --  1.5* 1.9 1.9  --   PHOS  --  3.3 3.4 2.9  --    GFR: Estimated Creatinine Clearance: 53.5 mL/min (A) (by C-G formula based on SCr of 1.49 mg/dL (H)). Recent Labs  Lab 02/18/20 2234 02/20/20 0318 02/21/20 0747 02/22/20 0721 02/23/20 0453  PROCALCITON  --  <0.10  --   --   --   WBC  --  13.4* 14.2* 14.9* 13.7*  LATICACIDVEN 1.2  --   --   --  1.2   Liver Function Tests: Recent Labs  Lab 02/18/20 2125  02/20/20 0318 02/21/20 0747 02/22/20 0721 02/23/20 0453  AST _0 ALT _1 44 36  ALKPHOS 78 75 76 79 76  BILITOT 0.2* 0.3 0.8 0.5 0.9  PROT 7.2 6.6 7.1 6.5 6.6  ALBUMIN 3.6 3.2* 3.4* 3.2* 3.3*   No results for input(s): LIPASE, AMYLASE in the last 168 hours. No results for input(s): AMMONIA in the last 168 hours. ABG No results found for: PHART, PCO2ART, PO2ART, HCO3, TCO2, ACIDBASEDEF, O2SAT  Coagulation Profile: Recent Labs  Lab 02/20/20 0318  INR 1.1   Kara Mead MD. [facility]. [facility] See Amion for pager  If  no response to pager , please call [phone] until 7 pm After 7:00 pm call Elink  [phone]     02/23/2020

## 2020-02-23 NOTE — Plan of Care (Signed)
  Problem: Education: Goal: Knowledge of General Education information will improve Description Including pain rating scale, medication(s)/side effects and non-pharmacologic comfort measures Outcome: Progressing   Problem: Health Behavior/Discharge Planning: Goal: Ability to manage health-related needs will improve Outcome: Progressing   

## 2020-02-23 NOTE — Progress Notes (Signed)
MD gave RN verbal order to administer PRN hydralazine before the minimum 6 hour window due to patient's high blood pressure reading of 160/95 and before his schedule surgical procedure in the OR

## 2020-02-23 NOTE — Anesthesia Postprocedure Evaluation (Signed)
Anesthesia Post Note  Patient: Corey Dixon  Procedure(s) Performed: LIVER BIOPSY LAPAROSCOPY DIAGNOSTIC ESOPHAGOGASTRODUODENOSCOPY ENDOSCOPY     Patient location during evaluation: PACU Anesthesia Type: General Level of consciousness: sedated Pain management: pain level controlled Vital Signs Assessment: post-procedure vital signs reviewed and stable Respiratory status: spontaneous breathing and respiratory function stable Cardiovascular status: stable Postop Assessment: no apparent nausea or vomiting Anesthetic complications: no   No complications documented.  Last Vitals:  Vitals:   02/23/20 1239 02/23/20 1259  BP: 132/78 118/78  Pulse: 96 91  Resp: 16 16  Temp: 36.4 C (!) 36.3 C  SpO2: 95% 93%    Last Pain:  Vitals:   02/23/20 1259  TempSrc: Oral  PainSc:                  [name] DANIEL

## 2020-02-23 NOTE — Anesthesia Preprocedure Evaluation (Addendum)
Anesthesia Evaluation  Patient identified by MRN, date of birth, ID band Patient awake    Reviewed: Allergy & Precautions, NPO status , Patient's Chart, lab work & pertinent test results  History of Anesthesia Complications Negative for: history of anesthetic complications  Airway Mallampati: II  TM Distance: >3 FB Neck ROM: Full    Dental no notable dental hx. (+) Caps, Dental Advisory Given   Pulmonary shortness of breath, COPD,  COPD inhaler, former smoker,  LUL cancer Covid Dx 1/10   Pulmonary exam normal        Cardiovascular negative cardio ROS Normal cardiovascular exam     Neuro/Psych negative neurological ROS  negative psych ROS   GI/Hepatic Neg liver ROS, GERD  Medicated,  Endo/Other  negative endocrine ROS  Renal/GU Renal hypertension and Renal InsufficiencyRenal disease   Bladder cancer     Musculoskeletal negative musculoskeletal ROS (+)   Abdominal   Peds  Hematology  (+) Blood dyscrasia, anemia ,   Anesthesia Other Findings Day of surgery medications reviewed with the patient.  Reproductive/Obstetrics                            Anesthesia Physical  Anesthesia Plan  ASA: III  Anesthesia Plan: General   Post-op Pain Management:    Induction: Intravenous  PONV Risk Score and Plan: 4 or greater and Dexamethasone, Ondansetron, Midazolam and Scopolamine patch - Pre-op  Airway Management Planned: Oral ETT  Additional Equipment:   Intra-op Plan:   Post-operative Plan: Extubation in OR  Informed Consent: I have reviewed the patients History and Physical, chart, labs and discussed the procedure including the risks, benefits and alternatives for the proposed anesthesia with the patient or authorized representative who has indicated his/her understanding and acceptance.     Dental advisory given  Plan Discussed with: CRNA and Anesthesiologist  Anesthesia Plan  Comments:        Anesthesia Quick Evaluation

## 2020-02-23 NOTE — Progress Notes (Signed)
PROGRESS NOTE    Corey Dixon  UXN:[id number] DOB: Jul 05, 1955 DOA: 02/18/2020 PCP: Arman Bogus., MD   Brief Narrative: 65 year old with past medical history significant for COPD, GERD, bladder cancer status post BCG who presented to [facility] due to increased work of breathing, chest pain that has been going on since his diagnosis of Covid on 1/10.  Patient had nausea and vomiting initially with the  diagnosis of Covid.  Subsequently that resolved.  He reports 10 pounds weight loss since his symptoms are started.  With worsening increased work of breathing.  Evaluation in the ED creatinine of 2.0, lactic acid 1.2.  Chest x-ray was suggestive of developing cavitary lesion in the left apex.  CT chest without contrast showed left upper lobe opacity which corresponds to a cavitary mass within left upper lobe possible representing postinflammatory cavitary lesion or a cavitary neoplasm.  AFB sputum ordered.  ID and pulmonologist has been consulted.  Patient develops chest pain 1/27, chest x ray showed free air abdomen. CT abdomen pelvis showed moderate Pneumoperitoneum of indeterminate etiology, concern for bowel perforation. Colonic diverticulosis, pulmonary and hepatic metastasis diseases. He was evaluated by surgery, his abdomen was benign, plan was to repeat CT scan with oral contrast. CT with contrast showed  No source of perforation, large volume of free air increase from prior CT scan, diffuse wall thickening of the stomach, multiple BL pulmonary nodule and 2 cm mass left hepatic lobe.   Patient and family discussed case with surgery, decision was made to proceed with diagnostic  laparoscopy abdomen 1/28.   Assessment & Plan:   Principal Problem:   Cavitary lesion of lung Active Problems:   COVID-19 virus infection   AKI (acute kidney injury) ([place])   COPD (chronic obstructive pulmonary disease) (HCC)   History of bladder cancer   Lung nodule   Mediastinal lymphadenopathy   S/P  bronchoscopy  1-Cavitary lung mass; Multiples Pulmonary nodule; Probably related to Malignancy. ID consulted, recommend to stop antibiotics, AFB sputum and pulmonary consultation.  Recent covid infection 10 days ago. PCR in hospital negative. Off isolation.  Follow: Cryptococcal antigen negative , histoplasma antigen, Legionella antigen,coccidiodes, Blastomyces antigen negative, acid-fast smear times 2 negative.  Underwent Bronchoscopy 1/26. Cytology: awaiting confirmatory results, Dr Elsworth Soho will discussed results with patient.   2-COPD; continue with inhaler.  Started on prednisone taper dose.  Report dyspnea on exertion. Repeated chest x ray improvement of lung aeration.  I have schedule Albuterol/ipratropium.  Patient can use advair BID.  He report continue feeling of SOB. I have asked Dr Elsworth Soho to follow up on patient. I have schedule nebulizer. Patient decline VQ scan to evaluate for PE.   Pneumoperitoneum:  Free air abdomen per chest x ray. Late entrance of information:  I informed this result to patient on 1/27, plan was to proceed with CT scan. I couldn't  inform wife result of chest x ray and plan for CT scan because she had to answer another call on the phone.  -CT abdomen showed no source for Pneumoperitoneum. Worsening free air on CT scan today.  -Dr Kae Heller discussed with patient and wife options. Patient subsequently decided to proceed with laparoscopy for diagnosis.    3-AKI: Improved with IV fluids Cr peak to 2.0. Cr down to 1.4--1.5  4-History of bladder cancer, S.P CBG.   5-Hypomagnesemia; replaced.  6-Anemia;  Folic acid low. Started  supplement.  Iron and B 12 normal.   7-HTN;  Continue with norvasc  10 mg. PRN  hydralazine.  Added oral hydralazine.  In reviewing prior records, he had elevated BP at one out patient visit. Explain to patient that also pain and stress, anxiety could cause elevated BP.   8-Headache; MRI; negative for metastasis. Microhemorrhage.  Neurology consulted.  Plan to controlled BP, if persist could consider CT to evaluate vein system.   Mild hyponatremia. Resolved.   Chest pain, right side, radiate right arm;  Could be related post bronchoscopy.  Chest x ray negative for pneumothorax.  He report improvement of pain.   Estimated body mass index is 24.49 kg/m as calculated from the following:   Height as of this encounter: 6' (1.829 m).   Weight as of this encounter: 81.9 kg.   DVT prophylaxis: Lovenox Code Status: Full Code Family Communication: care discussed with patient and wife who was on the phone on 1/27 also call wife on 1/28 Disposition Plan:  Status is: Inpatient  Remains inpatient appropriate because:Ongoing diagnostic testing needed not appropriate for outpatient work up   Dispo: The patient is from: Home              Anticipated d/c is to: Home              Anticipated d/c date is: 3 days              Patient currently is not medically stable to d/c.   Difficult to place patient No        Consultants:   Pulmonology  ID  Procedures:   None  Antimicrobials:    Subjective: He is having headaches this morning.  He report SOB, although this morning he has been able to go to bathroom without significant distress.  He relates chest pain is better.  I explain plan for management his BP and his SOB. He understood plan of care. He is ok with me to continue to be his Doctor.   Objective: Vitals:   02/22/20 1842 02/22/20 1957 02/23/20 0411 02/23/20 0606  BP: 136/80 (!) 156/75 (!) 153/95 (!) 143/92  Pulse:  87 79 82  Resp:  18 18   Temp:  97.7 F (36.5 C) 97.8 F (36.6 C)   TempSrc:   Oral   SpO2:  96% 96%   Weight:      Height:        Intake/Output Summary (Last 24 hours) at 02/23/2020 0814 Last data filed at 02/23/2020 0603 Gross per 24 hour  Intake 2558.59 ml  Output --  Net 7412.59 ml   Filed Weights   02/18/20 2027 02/20/20 0025 02/21/20 1347  Weight: 83.9 kg 81.9 kg  81.9 kg    Examination:  General exam: Anxious Respiratory system: CTA Cardiovascular system:  S 1, S 2 RRR Gastrointestinal system: BS present, soft, nt Central nervous system: alert.  Extremities: No edema   Data Reviewed: I have personally reviewed following labs and imaging studies  CBC: Recent Labs  Lab 02/18/20 2125 02/20/20 0318 02/21/20 0747 02/22/20 0721 02/23/20 0453  WBC 9.5 13.4* 14.2* 14.9* 13.7*  NEUTROABS 7.8*  --  13.0* 12.8* 11.0*  HGB 12.0* 11.5* 12.5* 12.8* 13.1  HCT 35.4* 33.2* 38.4* 39.0 39.6  MCV 84.1 82.8 84.0 83.7 84.3  PLT 304 298 361 336 878   Basic Metabolic Panel: Recent Labs  Lab 02/18/20 2125 02/20/20 0318 02/21/20 0747 02/22/20 0721 02/23/20 0453  NA 136 135 134* 135 136  K 4.1 4.4 4.4 4.5 4.3  CL 102 103 102 101 100  CO2 23  20* 20* 22 24  GLUCOSE 125* 165* 158* 139* 102*  BUN 20 26* 27* 33* 30*  CREATININE 2.02* 1.46* 1.43* 1.59* 1.49*  CALCIUM 8.9 9.0 9.2 8.9 9.2  MG  --  1.5* 1.9 1.9  --   PHOS  --  3.3 3.4 2.9  --    GFR: Estimated Creatinine Clearance: 53.5 mL/min (A) (by C-G formula based on SCr of 1.49 mg/dL (H)). Liver Function Tests: Recent Labs  Lab 02/18/20 2125 02/20/20 0318 02/21/20 0747 02/22/20 0721 02/23/20 0453  AST _0 ALT _1 44 36  ALKPHOS 78 75 76 79 76  BILITOT 0.2* 0.3 0.8 0.5 0.9  PROT 7.2 6.6 7.1 6.5 6.6  ALBUMIN 3.6 3.2* 3.4* 3.2* 3.3*   No results for input(s): LIPASE, AMYLASE in the last 168 hours. No results for input(s): AMMONIA in the last 168 hours. Coagulation Profile: Recent Labs  Lab 02/20/20 0318  INR 1.1   Cardiac Enzymes: No results for input(s): CKTOTAL, CKMB, CKMBINDEX, TROPONINI in the last 168 hours. BNP (last 3 results) No results for input(s): PROBNP in the last 8760 hours. HbA1C: No results for input(s): HGBA1C in the last 72 hours. CBG: No results for input(s): GLUCAP in the last 168 hours. Lipid Profile: No results for input(s): CHOL, HDL,  LDLCALC, TRIG, CHOLHDL, LDLDIRECT in the last 72 hours. Thyroid Function Tests: No results for input(s): TSH, T4TOTAL, FREET4, T3FREE, THYROIDAB in the last 72 hours. Anemia Panel: Recent Labs    02/21/20 0747 02/22/20 0721  VITAMINB12 782  --   FOLATE 3.4*  --   FERRITIN 200 208  TIBC 364  --   IRON 111  --   RETICCTPCT 1.8  --    Sepsis Labs: Recent Labs  Lab 02/18/20 2234 02/20/20 0318 02/23/20 0453  PROCALCITON  --  <0.10  --   LATICACIDVEN 1.2  --  1.2    Recent Results (from the past 240 hour(s))  Blood culture (routine x 2)     Status: None (Preliminary result)   Collection Time: 02/18/20 10:25 PM   Specimen: BLOOD  Result Value Ref Range Status   Specimen Description BLOOD LEFT ANTECUBITAL  Final   Special Requests   Final    BOTTLES DRAWN AEROBIC AND ANAEROBIC Blood Culture adequate volume   Culture   Final    NO GROWTH 3 DAYS Performed at [facility], [place] [address], [place], [place] [address]    Report Status PENDING  Incomplete  Blood culture (routine x 2)     Status: None (Preliminary result)   Collection Time: 02/18/20 10:30 PM   Specimen: BLOOD  Result Value Ref Range Status   Specimen Description BLOOD BLOOD RIGHT FOREARM  Final   Special Requests   Final    BOTTLES DRAWN AEROBIC AND ANAEROBIC Blood Culture adequate volume   Culture   Final    NO GROWTH 3 DAYS Performed at [facility], [place] [address], [place], [place] [address]    Report Status PENDING  Incomplete  SARS CORONAVIRUS 2 (TAT 6-24 HRS)     Status: None   Collection Time: 02/20/20  2:24 AM  Result Value Ref Range Status   SARS Coronavirus 2 NEGATIVE NEGATIVE Final    Comment: (NOTE) SARS-CoV-2 target nucleic acids are NOT DETECTED.  The SARS-CoV-2 RNA is generally detectable in upper and lower respiratory specimens during the acute phase of infection. Negative results do not preclude SARS-CoV-2 infection, do  not rule out co-infections with other pathogens, and  should not be used as the sole basis for treatment or other patient management decisions. Negative results must be combined with clinical observations, patient history, and epidemiological information. The expected result is Negative.  Fact Sheet for Patients: SugarRoll.be  Fact Sheet for Healthcare Providers: https://www.woods-mathews.com/  This test is not yet approved or cleared by the [place] FDA and  has been authorized for detection and/or diagnosis of SARS-CoV-2 by FDA under an Emergency Use Authorization (EUA). This EUA will remain  in effect (meaning this test can be used) for the duration of the COVID-19 declaration under Se ction 564(b)(1) of the Act, 21 U.S.C. section 360bbb-3(b)(1), unless the authorization is terminated or revoked sooner.  Performed at [facility], [place] [address], [place], [place] [address]   MRSA PCR Screening     Status: None   Collection Time: 02/20/20  6:46 PM   Specimen: Nasal Mucosa; Nasopharyngeal  Result Value Ref Range Status   MRSA by PCR NEGATIVE NEGATIVE Final    Comment:        The GeneXpert MRSA Assay (FDA approved for NASAL specimens only), is one component of a comprehensive MRSA colonization surveillance program. It is not intended to diagnose MRSA infection nor to guide or monitor treatment for MRSA infections. Performed at [facility], [place] [address] [place], [place] [address]   Culture, BAL-quantitative     Status: None (Preliminary result)   Collection Time: 02/21/20  2:16 PM   Specimen: PATH Cytology washing; Lung  Result Value Ref Range Status   Specimen Description BRONCHIAL WASHINGS  Final   Special Requests LUL SAMPLE A  Final   Gram Stain   Final    FEW WBC PRESENT, PREDOMINANTLY MONONUCLEAR NO ORGANISMS SEEN    Culture   Final    NO GROWTH 2 DAYS Performed at [facility], [place] [address], [place], [place] [address]    Report Status PENDING   Incomplete  Acid Fast Smear (AFB)     Status: None   Collection Time: 02/21/20  2:16 PM   Specimen: PATH Cytology washing; Lung  Result Value Ref Range Status   AFB Specimen Processing Concentration  Final   Acid Fast Smear Negative  Final    Comment: (NOTE) Performed At: [facility] [place], [place] [id number] Rush Farmer MD SJ:[id number]    Source (AFB) BRONCHIAL WASHINGS  Final    Comment: LUL SAMPLE A Performed at [facility], [place] [address] [place], [place] [address]   Acid Fast Smear (AFB)     Status: None   Collection Time: 02/21/20  2:49 PM   Specimen: PATH Cytology FNA; Node  Result Value Ref Range Status   AFB Specimen Processing Concentration  Final   Acid Fast Smear Negative  Final    Comment: (NOTE) Performed At: [facility] [place], [place] [id number] Rush Farmer MD EX:[id number]    Source (AFB) NEEDLE ASPIRATE SAMPLE E  Final    Comment: Performed at [facility], [place] [address], [place], [place] [address]         Radiology Studies: CT ABDOMEN PELVIS WO CONTRAST  Result Date: 02/23/2020 CLINICAL DATA:  Concern for perforation. EXAM: CT ABDOMEN AND PELVIS WITHOUT CONTRAST TECHNIQUE: Multidetector CT imaging of the abdomen and pelvis was performed following the standard protocol without IV contrast. COMPARISON:  February 22, 2020 FINDINGS: Lower chest: Multiple bilateral pulmonary  nodules are noted.The heart size is normal. Hepatobiliary: There is a 2 cm mass within the left hepatic lobe. Normal gallbladder.There is no biliary ductal dilation. Pancreas: Normal contours without ductal dilatation. No peripancreatic fluid collection. Spleen: Unremarkable. Adrenals/Urinary Tract: --Adrenal glands: Unremarkable. --Right kidney/ureter: The right kidney is absent. --Left kidney/ureter: No hydronephrosis or radiopaque kidney stones. --Urinary bladder: Unremarkable. Stomach/Bowel: --Stomach/Duodenum: A there  is diffuse wall thickening of the stomach. --Small bowel: Unremarkable. --Colon: There is a large amount of stool throughout the colon. There are scattered colonic diverticula without CT evidence for diverticulitis. --Appendix: Normal. Vascular/Lymphatic: Atherosclerotic calcification is present within the non-aneurysmal abdominal aorta, without hemodynamically significant stenosis. --there are no pathologically enlarged retroperitoneal lymph nodes. --No mesenteric lymphadenopathy. --No pelvic or inguinal lymphadenopathy. Reproductive: Unremarkable Other: There is a large volume of free air, increased from prior study. No significant free fluid. No extraluminal oral contrast. Musculoskeletal. There is a sclerotic lesion in the L3 vertebral body. There are end-stage degenerative changes of the left hip. IMPRESSION: 1. Large volume of free air, increased from prior study. No extraluminal oral contrast. While the exact site of perforation is not visualized, the stomach is favored to be the source. 2. Diffuse wall thickening of the stomach, concerning for gastritis. 3. Multiple bilateral pulmonary nodules, concerning for metastatic disease. 4. Indeterminate 2 cm mass within the left hepatic lobe, also concerning for metastatic disease. 5. Sclerotic lesion in the L3 vertebral body, concerning for osseous metastatic disease. 6. End-stage degenerative changes of the left hip. 7. Large amount of stool throughout the colon. Aortic Atherosclerosis (ICD10-I70.0). Electronically Signed   By: Constance Holster M.D.   On: 02/23/2020 00:44   CT ABDOMEN PELVIS WO CONTRAST  Result Date: 02/22/2020 CLINICAL DATA:  65 year old male with concern for peritonitis or perforation. EXAM: CT ABDOMEN AND PELVIS WITHOUT CONTRAST TECHNIQUE: Multidetector CT imaging of the abdomen and pelvis was performed following the standard protocol without IV contrast. COMPARISON:  Chest radiograph dated 02/22/2020. FINDINGS: Evaluation of this exam is  limited in the absence of intravenous contrast. Lower chest: There is background of emphysema. Right lung base nodular and streaky densities may represent atelectasis but concerning for infiltrate. Several scattered bilateral pulmonary nodules measure up to 11 mm also seen on the prior CT in keeping with metastatic disease. There is moderate pneumoperitoneum. No free fluid. Hepatobiliary: There is a 17 mm hypodense lesion in the left lobe of the liver which is not characterized on this CT, but most consistent with metastatic disease. No intrahepatic biliary dilatation. The gallbladder is unremarkable. Pancreas: Unremarkable. No pancreatic ductal dilatation or surrounding inflammatory changes. Spleen: Normal in size without focal abnormality. Adrenals/Urinary Tract: The adrenal glands unremarkable. There is a solitary left kidney. There is no hydronephrosis or nephrolithiasis of the left kidney. The left ureter and urinary bladder appear unremarkable. Stomach/Bowel: There is moderate stool throughout the colon. There is sigmoid diverticulosis and scattered colonic diverticula without active inflammatory changes. There is no bowel obstruction or active inflammation. The appendix is normal. Vascular/Lymphatic: Mild aortoiliac atherosclerotic disease. The IVC is unremarkable. No portal venous gas. There is no adenopathy. Reproductive: The prostate and seminal vesicles are grossly unremarkable. No pelvic mass. Other: Small containing bilateral inguinal hernias as well as a small fat containing umbilical hernia. Musculoskeletal: A 15 mm sclerotic lesion of L3. Severe degenerative changes of the left hip. No acute osseous pathology. Left L5 pars defect. IMPRESSION: 1. Moderate pneumoperitoneum of indeterminate etiology but concerning for bowel perforation. Clinical correlation and surgical consult is  advised. 2. Colonic diverticulosis. No bowel obstruction. Normal appendix. 3. Solitary left kidney. No hydronephrosis or  nephrolithiasis. 4. Pulmonary and hepatic metastatic disease. 5. A 15 mm sclerotic lesion of L3. 6. Aortic Atherosclerosis (ICD10-I70.0) and Emphysema (ICD10-J43.9). These results were called by telephone at the time of interpretation on 02/22/2020 at 7:06 pm to [place], who verbally acknowledged these results. Electronically Signed   By: Anner Crete M.D.   On: 02/22/2020 19:09   MR BRAIN W WO CONTRAST  Result Date: 02/21/2020 CLINICAL DATA:  Headache, chronic, no new features; non-small cell lung cancer, staging. EXAM: MRI HEAD WITHOUT AND WITH CONTRAST TECHNIQUE: Multiplanar, multiecho pulse sequences of the brain and surrounding structures were obtained without and with intravenous contrast. CONTRAST:  56m GADAVIST GADOBUTROL 1 MMOL/ML IV SOLN COMPARISON:  No pertinent prior exams available for comparison. FINDINGS: Brain: Mild intermittent motion degradation. Mild cerebral and cerebellar atrophy. No abnormal intracranial enhancement is identified to suggest intracranial metastatic disease. Mild multifocal T2/FLAIR hyperintensity within the cerebral white matter is nonspecific, but compatible with chronic small vessel ischemic disease. An apparent punctate focus of T2/FLAIR hyperintensity within the left medulla is not appreciated on the axial or coronal T2 weighted sequences and is likely artifactual (series 7, image 3). Punctate focus of SWI signal loss within the right frontal lobe compatible with nonspecific chronic microhemorrhage. There is no acute infarct. No evidence of intracranial mass. No extra-axial fluid collection. No midline shift. Vascular: Expected proximal arterial flow voids. Skull and upper cervical spine: No focal marrow lesion. Sinuses/Orbits: Visualized orbits show no acute finding. Mild ethmoid sinus mucosal thickening. 1.5 cm left maxillary sinus mucous retention cyst. IMPRESSION: Mildly motion degraded examination. No intracranial metastatic disease is identified. No  evidence of acute intracranial abnormality. Mild generalized atrophy of the brain and chronic small vessel ischemic disease. Nonspecific chronic microhemorrhage within the right frontal lobe. Mild ethmoid sinus mucosal thickening. 1.5 cm left maxillary sinus mucous retention cyst. Electronically Signed   By: KKellie SimmeringDO   On: 02/21/2020 18:59   DG CHEST PORT 1 VIEW  Result Date: 02/22/2020 CLINICAL DATA:  Recent COVID infection with persistent shortness of breath EXAM: PORTABLE CHEST 1 VIEW COMPARISON:  02/21/2020 FINDINGS: Cardiac shadow is within normal limits. Lungs are well aerated bilaterally. Persistent but improved airspace opacity is noted consistent with the given clinical history. Lucency is noted beneath the right hemidiaphragm new from the previous exams suspicious for free intraperitoneal air. IMPRESSION: Persistent but improved aeration bilaterally consistent with the known history of COVID-19 positivity. Findings suggestive of free intraperitoneal air. CT of the abdomen and pelvis is recommended for further evaluation. Electronically Signed   By: MInez CatalinaM.D.   On: 02/22/2020 11:22   DG CHEST PORT 1 VIEW  Result Date: 02/21/2020 CLINICAL DATA:  History of COVID-19 positivity, recent bronchoscopy EXAM: PORTABLE CHEST 1 VIEW COMPARISON:  02/18/2020 FINDINGS: Cardiac shadow is within normal limits. The lungs are hyperinflated. Cavitary lesion in the left upper lobe laterally is again seen. Known nodules are less well appreciated on this exam. No pneumothorax is noted following bronchoscopy. IMPRESSION: No evidence of post bronchoscopy pneumothorax. Electronically Signed   By: MInez CatalinaM.D.   On: 02/21/2020 15:59   DG C-ARM BRONCHOSCOPY  Result Date: 02/21/2020 C-ARM BRONCHOSCOPY: Fluoroscopy was utilized by the requesting physician.  No radiographic interpretation.        Scheduled Meds: . amLODipine  10 mg Oral Daily  . vitamin C  500 mg Oral Daily  .  folic acid  1 mg  Oral Daily  . melatonin  5 mg Oral QHS  . pantoprazole (PROTONIX) IV  40 mg Intravenous Q12H  . predniSONE  40 mg Oral Q breakfast  . zinc sulfate  220 mg Oral Daily   Continuous Infusions: . sodium chloride Stopped (02/19/20 1718)  . lactated ringers 110 mL/hr at 02/23/20 0637  . piperacillin-tazobactam (ZOSYN)  IV 3.375 g (02/23/20 0603)     LOS: 3 days    Time spent: 35 minutes    Jayvon Mounger A Braian Tijerina, MD [facility]   If 7PM-7AM, please contact night-coverage [email or web address]  02/23/2020, 8:14 AM

## 2020-02-23 NOTE — Anesthesia Procedure Notes (Signed)
Procedure Name: Intubation Date/Time: 02/23/2020 10:43 AM Performed by: Duane Boston, MD Pre-anesthesia Checklist: Patient identified, Emergency Drugs available, Suction available and Patient being monitored Patient Re-evaluated:Patient Re-evaluated prior to induction Oxygen Delivery Method: Circle system utilized Preoxygenation: Pre-oxygenation with 100% oxygen Induction Type: IV induction Ventilation: Oral airway inserted - appropriate to patient size Laryngoscope Size: Sabra Heck and 2 Grade View: Grade II Tube type: Oral Tube size: 7.5 mm Number of attempts: 1 Airway Equipment and Method: Stylet and Oral airway Placement Confirmation: ETT inserted through vocal cords under direct vision,  positive ETCO2 and breath sounds checked- equal and bilateral Secured at: 25 cm Tube secured with: Tape Dental Injury: Teeth and Oropharynx as per pre-operative assessment  Comments: Attempt with MAC 4 and Mil 3 unsuccessful by crna, grade 2B view achieved by MD with Mil 2

## 2020-02-23 NOTE — Transfer of Care (Signed)
Immediate Anesthesia Transfer of Care Note  Patient: Corey Dixon  Procedure(s) Performed: LIVER BIOPSY LAPAROSCOPY DIAGNOSTIC ESOPHAGOGASTRODUODENOSCOPY ENDOSCOPY  Patient Location: PACU  Anesthesia Type:General  Level of Consciousness: drowsy and patient cooperative  Airway & Oxygen Therapy: Patient Spontanous Breathing and Patient connected to face mask oxygen  Post-op Assessment: Report given to RN and Post -op Vital signs reviewed and stable  Post vital signs: Reviewed  Last Vitals:  Vitals Value Taken Time  BP 136/83 02/23/20 1225  Temp    Pulse 96 02/23/20 1228  Resp 18 02/23/20 1228  SpO2 97 % 02/23/20 1228  Vitals shown include unvalidated device data.  Last Pain:  Vitals:   02/23/20 0940  TempSrc:   PainSc: 4       Patients Stated Pain Goal: 0 (00/52/59 1028)  Complications: No complications documented.

## 2020-02-23 NOTE — Progress Notes (Addendum)
2 Days Post-Op   Subjective/Chief Complaint: Denies any abdominal pain whatsoever, denies nausea.  He did have some nausea and vomiting when he was being treated for his Covid a few weeks ago but other than that he denies any recent gastrointestinal symptoms, appetite change, weight loss or change in bowel habits.  He is primarily concerned about his breathing difficulty and his high blood pressure, and does not want to stay in the hospital.   Objective: Vital signs in last 24 hours: Temp:  [97.7 F (36.5 C)-98.9 F (37.2 C)] 97.8 F (36.6 C) (01/28 0834) Pulse Rate:  [79-98] 80 (01/28 0834) Resp:  [17-18] 17 (01/28 0834) BP: (136-185)/(75-98) 160/95 (01/28 0834) SpO2:  [94 %-97 %] 94 % (01/28 0834) Last BM Date: 02/21/20  Intake/Output from previous day: 01/27 0701 - 01/28 0700 In: 2818.6 [P.O.:1820; I.V.:898.6; IV Piggyback:100] Out: -  Intake/Output this shift: No intake/output data recorded.  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  This is a 100% completely benign abdominal exam. Extremities: extremities normal, atraumatic, no cyanosis or edema Skin: Skin color, texture, turgor normal. No rashes or lesions  Lab Results:  Recent Labs    02/22/20 0721 02/23/20 0453  WBC 14.9* 13.7*  HGB 12.8* 13.1  HCT 39.0 39.6  PLT 336 343   BMET Recent Labs    02/22/20 0721 02/23/20 0453  NA 135 136  K 4.5 4.3  CL 101 100  CO2 22 24  GLUCOSE 139* 102*  BUN 33* 30*  CREATININE 1.59* 1.49*  CALCIUM 8.9 9.2   PT/INR No results for input(s): LABPROT, INR in the last 72 hours. ABG No results for input(s): PHART, HCO3 in the last 72 hours.  Invalid input(s): PCO2, PO2  Studies/Results: CT ABDOMEN PELVIS WO CONTRAST  Result Date: 02/23/2020 CLINICAL DATA:  Concern for  perforation. EXAM: CT ABDOMEN AND PELVIS WITHOUT CONTRAST TECHNIQUE: Multidetector CT imaging of the abdomen and pelvis was performed following the standard protocol without IV contrast. COMPARISON:  February 22, 2020 FINDINGS: Lower chest: Multiple bilateral pulmonary nodules are noted.The heart size is normal. Hepatobiliary: There is a 2 cm mass within the left hepatic lobe. Normal gallbladder.There is no biliary ductal dilation. Pancreas: Normal contours without ductal dilatation. No peripancreatic fluid collection. Spleen: Unremarkable. Adrenals/Urinary Tract: --Adrenal glands: Unremarkable. --Right kidney/ureter: The right kidney is absent. --Left kidney/ureter: No hydronephrosis or radiopaque kidney stones. --Urinary bladder: Unremarkable. Stomach/Bowel: --Stomach/Duodenum: A there is diffuse wall thickening of the stomach. --Small bowel: Unremarkable. --Colon: There is a large amount of stool throughout the colon. There are scattered colonic diverticula without CT evidence for diverticulitis. --Appendix: Normal. Vascular/Lymphatic: Atherosclerotic calcification is present within the non-aneurysmal abdominal aorta, without hemodynamically significant stenosis. --there are no pathologically enlarged retroperitoneal lymph nodes. --No mesenteric lymphadenopathy. --No pelvic or inguinal lymphadenopathy. Reproductive: Unremarkable Other: There is a large volume of free air, increased from prior study. No significant free fluid. No extraluminal oral contrast. Musculoskeletal. There is a sclerotic lesion in the L3 vertebral body. There are end-stage degenerative changes of the left hip. IMPRESSION: 1. Large volume of free air, increased from prior study. No extraluminal oral contrast. While the exact site of perforation is not visualized, the stomach is favored to be the source. 2. Diffuse wall thickening of the stomach, concerning for gastritis. 3. Multiple bilateral pulmonary nodules, concerning for metastatic  disease. 4.  Indeterminate 2 cm mass within the left hepatic lobe, also concerning for metastatic disease. 5. Sclerotic lesion in the L3 vertebral body, concerning for osseous metastatic disease. 6. End-stage degenerative changes of the left hip. 7. Large amount of stool throughout the colon. Aortic Atherosclerosis (ICD10-I70.0). Electronically Signed   By: Constance Holster M.D.   On: 02/23/2020 00:44   CT ABDOMEN PELVIS WO CONTRAST  Result Date: 02/22/2020 CLINICAL DATA:  65 year old male with concern for peritonitis or perforation. EXAM: CT ABDOMEN AND PELVIS WITHOUT CONTRAST TECHNIQUE: Multidetector CT imaging of the abdomen and pelvis was performed following the standard protocol without IV contrast. COMPARISON:  Chest radiograph dated 02/22/2020. FINDINGS: Evaluation of this exam is limited in the absence of intravenous contrast. Lower chest: There is background of emphysema. Right lung base nodular and streaky densities may represent atelectasis but concerning for infiltrate. Several scattered bilateral pulmonary nodules measure up to 11 mm also seen on the prior CT in keeping with metastatic disease. There is moderate pneumoperitoneum. No free fluid. Hepatobiliary: There is a 17 mm hypodense lesion in the left lobe of the liver which is not characterized on this CT, but most consistent with metastatic disease. No intrahepatic biliary dilatation. The gallbladder is unremarkable. Pancreas: Unremarkable. No pancreatic ductal dilatation or surrounding inflammatory changes. Spleen: Normal in size without focal abnormality. Adrenals/Urinary Tract: The adrenal glands unremarkable. There is a solitary left kidney. There is no hydronephrosis or nephrolithiasis of the left kidney. The left ureter and urinary bladder appear unremarkable. Stomach/Bowel: There is moderate stool throughout the colon. There is sigmoid diverticulosis and scattered colonic diverticula without active inflammatory changes. There is no  bowel obstruction or active inflammation. The appendix is normal. Vascular/Lymphatic: Mild aortoiliac atherosclerotic disease. The IVC is unremarkable. No portal venous gas. There is no adenopathy. Reproductive: The prostate and seminal vesicles are grossly unremarkable. No pelvic mass. Other: Small containing bilateral inguinal hernias as well as a small fat containing umbilical hernia. Musculoskeletal: A 15 mm sclerotic lesion of L3. Severe degenerative changes of the left hip. No acute osseous pathology. Left L5 pars defect. IMPRESSION: 1. Moderate pneumoperitoneum of indeterminate etiology but concerning for bowel perforation. Clinical correlation and surgical consult is advised. 2. Colonic diverticulosis. No bowel obstruction. Normal appendix. 3. Solitary left kidney. No hydronephrosis or nephrolithiasis. 4. Pulmonary and hepatic metastatic disease. 5. A 15 mm sclerotic lesion of L3. 6. Aortic Atherosclerosis (ICD10-I70.0) and Emphysema (ICD10-J43.9). These results were called by telephone at the time of interpretation on 02/22/2020 at 7:06 pm to [place], who verbally acknowledged these results. Electronically Signed   By: Anner Crete M.D.   On: 02/22/2020 19:09   MR BRAIN W WO CONTRAST  Result Date: 02/21/2020 CLINICAL DATA:  Headache, chronic, no new features; non-small cell lung cancer, staging. EXAM: MRI HEAD WITHOUT AND WITH CONTRAST TECHNIQUE: Multiplanar, multiecho pulse sequences of the brain and surrounding structures were obtained without and with intravenous contrast. CONTRAST:  6m GADAVIST GADOBUTROL 1 MMOL/ML IV SOLN COMPARISON:  No pertinent prior exams available for comparison. FINDINGS: Brain: Mild intermittent motion degradation. Mild cerebral and cerebellar atrophy. No abnormal intracranial enhancement is identified to suggest intracranial metastatic disease. Mild multifocal T2/FLAIR hyperintensity within the cerebral white matter is nonspecific, but compatible with chronic  small vessel ischemic disease. An apparent punctate focus of T2/FLAIR hyperintensity within the left medulla is not appreciated on the axial or coronal T2 weighted sequences and is likely artifactual (series 7, image 3). Punctate focus of SWI signal loss within the  right frontal lobe compatible with nonspecific chronic microhemorrhage. There is no acute infarct. No evidence of intracranial mass. No extra-axial fluid collection. No midline shift. Vascular: Expected proximal arterial flow voids. Skull and upper cervical spine: No focal marrow lesion. Sinuses/Orbits: Visualized orbits show no acute finding. Mild ethmoid sinus mucosal thickening. 1.5 cm left maxillary sinus mucous retention cyst. IMPRESSION: Mildly motion degraded examination. No intracranial metastatic disease is identified. No evidence of acute intracranial abnormality. Mild generalized atrophy of the brain and chronic small vessel ischemic disease. Nonspecific chronic microhemorrhage within the right frontal lobe. Mild ethmoid sinus mucosal thickening. 1.5 cm left maxillary sinus mucous retention cyst. Electronically Signed   By: Kellie Simmering DO   On: 02/21/2020 18:59   DG CHEST PORT 1 VIEW  Result Date: 02/22/2020 CLINICAL DATA:  Recent COVID infection with persistent shortness of breath EXAM: PORTABLE CHEST 1 VIEW COMPARISON:  02/21/2020 FINDINGS: Cardiac shadow is within normal limits. Lungs are well aerated bilaterally. Persistent but improved airspace opacity is noted consistent with the given clinical history. Lucency is noted beneath the right hemidiaphragm new from the previous exams suspicious for free intraperitoneal air. IMPRESSION: Persistent but improved aeration bilaterally consistent with the known history of COVID-19 positivity. Findings suggestive of free intraperitoneal air. CT of the abdomen and pelvis is recommended for further evaluation. Electronically Signed   By: Inez Catalina M.D.   On: 02/22/2020 11:22   DG CHEST PORT 1  VIEW  Result Date: 02/21/2020 CLINICAL DATA:  History of COVID-19 positivity, recent bronchoscopy EXAM: PORTABLE CHEST 1 VIEW COMPARISON:  02/18/2020 FINDINGS: Cardiac shadow is within normal limits. The lungs are hyperinflated. Cavitary lesion in the left upper lobe laterally is again seen. Known nodules are less well appreciated on this exam. No pneumothorax is noted following bronchoscopy. IMPRESSION: No evidence of post bronchoscopy pneumothorax. Electronically Signed   By: Inez Catalina M.D.   On: 02/21/2020 15:59   DG C-ARM BRONCHOSCOPY  Result Date: 02/21/2020 C-ARM BRONCHOSCOPY: Fluoroscopy was utilized by the requesting physician.  No radiographic interpretation.    Anti-infectives: Anti-infectives (From admission, onward)   Start     Dose/Rate Route Frequency Ordered Stop   02/22/20 2045  piperacillin-tazobactam (ZOSYN) IVPB 3.375 g        3.375 g 12.5 mL/hr over 240 Minutes Intravenous Every 8 hours 02/22/20 2043     02/19/20 1330  vancomycin (VANCOCIN) IVPB 1000 mg/200 mL premix  Status:  Discontinued        1,000 mg 200 mL/hr over 60 Minutes Intravenous Every 24 hours 02/19/20 1316 02/20/20 1558   02/19/20 1330  ceFEPIme (MAXIPIME) 2 g in sodium chloride 0.9 % 100 mL IVPB  Status:  Discontinued        2 g 200 mL/hr over 30 Minutes Intravenous Every 12 hours 02/19/20 1316 02/20/20 1558   02/18/20 2315  ceFEPIme (MAXIPIME) 2 g in sodium chloride 0.9 % 100 mL IVPB        2 g 200 mL/hr over 30 Minutes Intravenous  Once 02/18/20 2307 02/18/20 2356   02/18/20 2300  vancomycin (VANCOCIN) IVPB 1000 mg/200 mL premix        1,000 mg 200 mL/hr over 60 Minutes Intravenous  Once 02/18/20 2256 02/19/20 0243   02/18/20 2300  ceFEPIme (MAXIPIME) 1 g in sodium chloride 0.9 % 100 mL IVPB  Status:  Discontinued        1 g 200 mL/hr over 30 Minutes Intravenous  Once 02/18/20 2257 02/18/20 2307  Assessment/Plan: Pneumoperitoneum Recent diagnosis of Covid COPD Left upper lobe cavitary  lesion, mediastinal adenopathy, multiple pulmonary nodules Hypodense lesion of the liver History of bladder cancer Hypertension  Repeat CT scan with oral contrast overnight demonstrates slight increase in the volume of free air but there is still no free fluid and there was no extravasation of oral contrast.  Incidentally the stomach is noted to be quite thickened, there are bilateral pulmonary nodules, a 2 cm left hepatic lobe mass, and a sclerotic L3 vertebral body lesion all concerning for metastatic disease.  Pathology from his recent bronchoscopy is still pending although brushings did demonstrate atypical cells.  He remains completely soft and nontender, afebrile without tachycardia and his white blood cell count is stable with no metabolic acidosis and actually some improvement of his acute kidney injury.   I had a long discussion with the patient and separately with his wife by phone describing the CT findings and explaining the possible implications of pneumoperitoneum.  I offered a diagnostic laparoscopy both to assess for the site of perforation which I may or may not be able to see given that I expect it to be a microscopic lesion, but also to survey the abdomen for any carcinomatosis and if accessible potentially biopsy the liver met although it looks quite high on the dome of the left lobe.  If surgery is not pursued I would recommend the patient have an upper endoscopy performed as an outpatient given the thickening of the stomach.    Given that he is clinically well at this time, I think it is also reasonable to observe.  It is possible that there is a microscopic viscus perforation which will self seal. I explained to the patient that regardless of whether he agrees to diagnostic laparoscopy, I do not anticipate him being discharged in the next day or 2 given everything else going on.   At this time, the patient refuses surgery and wants to focus on working up his breathing problems and  his high blood pressure.  He states that any diagnostic evaluation for possible malignancy he would like to do with his surgeons at [facility] and I think that is completely reasonable.  I described to him that if he develops abdominal pain, fever, tachycardia or concerning changes in his lab work that I will strongly recommend diagnostic laparoscopy/possible exploratory laparotomy, and that it would be against my advice to leave the hospital without at least further observation of serial abdominal exams/ labs.   Questions welcomed and answered, he appears to have a sufficient understanding of what I have discussed with him to make informed decision about this.   Addendum, 9:23 AM: After speaking with his wife and the hospitalist, the patient now would like to proceed with surgery. We will plan diagnostic laparoscopy, possible exploratory laparotomy, possible liver biopsy. Questions welcomed and answered.    LOS: 3 days    Clovis Riley 02/23/2020

## 2020-02-23 NOTE — Op Note (Signed)
Operative Note  [name]  [id number]  [id number]  02/23/2020   Surgeon: Victorino Sparrow ConnorMD  Assistant: Judyann Munson RNFA  Procedure performed: Diagnostic laparoscopy, liver biopsy  Preop diagnosis: Pneumoperitoneum, liver mass Post-op diagnosis/intraop findings: Approximately 2 cm lesion on the anterior superior aspect of the left lobe of the liver, no other evidence of intraperitoneal malignancy.  No evidence of perforation or inflammation.  Specimens: Liver biopsy Retained items: no EBL: Minimal cc Complications: none  Description of procedure: After obtaining informed consent the patient was taken to the operating room and placed supine on operating room table wheregeneral endotracheal anesthesia was initiated, SCDs applied, and a formal timeout was performed.  He is on standing antibiotics.   The abdomen was prepped in usual sterile fashion and peritoneal access gained with an optical entry in the left upper quadrant.  Insufflation demonstrated no injury from entry or any gross abnormalities.  3 additional 5 mm trochars were placed across the mid abdomen.  Beginning in the pelvis, the rectum and sigmoid colon were inspected.  There are multiple large diverticuli but there is no fluid in the pelvis, no purulence and no inflammatory changes whatsoever.  The colon was followed proximally the descending and transverse colon also appeared normal and without any inflammation as does the ascending colon and cecum.  The appendix is normal.  The small bowel was run from the the cecal valve to the ligament of Treitz and there was no evidence of inflammation, perforation or mass.  The gallbladder appears normal.  The stomach is mildly distended and does seem somewhat thickened on palpation but there is no overt mass and there is no inflammation or evidence of perforation on the stomach, pylorus, or proximal duodenum.  There is no apparent inflammation along the colonic mesentery or  omentum.  There are no lesions on the visualized visceral or parietal peritoneum.  There is a solitary palpable 2 cm mass on the left anterior superior lobe of the liver.  This was biopsied, 2 cores of specimen were collected with a Tru-Cut needle.  Hemostasis was ensured with cautery.  There is a tiny hemangioma on the triangular ligament.  The diaphragm appears intact.  In general, aside from the liver mass which is known from CT this is a negative laparoscopy.   At this point, I attempted to perform an upper endoscopy to evaluate the thickened stomach noted on CT but the appropriate equipment/personnel could not be obtained in a timely fashion.  The procedure was terminated.  Skin incisions were closed with subcuticular Monocryl and Dermabond. The patient was then awakened, extubated and taken to PACU in stable condition.   All counts were correct at the completion of the case.

## 2020-02-23 NOTE — Progress Notes (Signed)
HOSPITAL MEDICINE OVERNIGHT EVENT NOTE    Reviewed results of repeat CT imaging of abdomen and pelvis with oral contrast.  Apparently, there is now a large volume of free air, increased from the prior CT study.  That being said, there is no evidence of extraluminal oral contrast.  Patient continues to deny any significant abdominal pain.  Nursing reports that serial abdominal exams reveal a soft nontender abdomen.  In accordance with general surgery's plan, in the absence of extraluminal oral contrast, symptoms of abdominal pain, or evidence of peritoneal signs management will be conservative at this time.  Keeping patient n.p.o. except for meds as recommended by surgery.  We will await surgery's repeat evaluation later this morning and further recommendations.  Their input is appreciated.  Corey Emerald  MD [facility]

## 2020-02-24 ENCOUNTER — Inpatient Hospital Stay (HOSPITAL_COMMUNITY): Payer: Medicare Other

## 2020-02-24 ENCOUNTER — Encounter (HOSPITAL_COMMUNITY): Payer: Self-pay | Admitting: Surgery

## 2020-02-24 LAB — CBC WITH DIFFERENTIAL/PLATELET
Abs Immature Granulocytes: 0.18 10*3/uL — ABNORMAL HIGH (ref 0.00–0.07)
Basophils Absolute: 0 10*3/uL (ref 0.0–0.1)
Basophils Relative: 0 %
Eosinophils Absolute: 0 10*3/uL (ref 0.0–0.5)
Eosinophils Relative: 0 %
HCT: 38.3 % — ABNORMAL LOW (ref 39.0–52.0)
Hemoglobin: 12.9 g/dL — ABNORMAL LOW (ref 13.0–17.0)
Immature Granulocytes: 1 %
Lymphocytes Relative: 5 %
Lymphs Abs: 0.7 10*3/uL (ref 0.7–4.0)
MCH: 28.6 pg (ref 26.0–34.0)
MCHC: 33.7 g/dL (ref 30.0–36.0)
MCV: 84.9 fL (ref 80.0–100.0)
Monocytes Absolute: 1.2 10*3/uL — ABNORMAL HIGH (ref 0.1–1.0)
Monocytes Relative: 9 %
Neutro Abs: 11 10*3/uL — ABNORMAL HIGH (ref 1.7–7.7)
Neutrophils Relative %: 85 %
Platelets: 274 10*3/uL (ref 150–400)
RBC: 4.51 MIL/uL (ref 4.22–5.81)
RDW: 12.6 % (ref 11.5–15.5)
WBC: 13.1 10*3/uL — ABNORMAL HIGH (ref 4.0–10.5)
nRBC: 0 % (ref 0.0–0.2)

## 2020-02-24 LAB — COMPREHENSIVE METABOLIC PANEL
ALT: 43 U/L (ref 0–44)
AST: 21 U/L (ref 15–41)
Albumin: 3.4 g/dL — ABNORMAL LOW (ref 3.5–5.0)
Alkaline Phosphatase: 75 U/L (ref 38–126)
Anion gap: 13 (ref 5–15)
BUN: 28 mg/dL — ABNORMAL HIGH (ref 8–23)
CO2: 21 mmol/L — ABNORMAL LOW (ref 22–32)
Calcium: 8.7 mg/dL — ABNORMAL LOW (ref 8.9–10.3)
Chloride: 100 mmol/L (ref 98–111)
Creatinine, Ser: 1.39 mg/dL — ABNORMAL HIGH (ref 0.61–1.24)
GFR, Estimated: 56 mL/min — ABNORMAL LOW (ref >60)
Glucose, Bld: 158 mg/dL — ABNORMAL HIGH (ref 70–99)
Potassium: 4 mmol/L (ref 3.5–5.1)
Sodium: 134 mmol/L — ABNORMAL LOW (ref 135–145)
Total Bilirubin: 0.6 mg/dL (ref 0.3–1.2)
Total Protein: 6.3 g/dL — ABNORMAL LOW (ref 6.5–8.1)

## 2020-02-24 LAB — CULTURE, BLOOD (ROUTINE X 2)
Culture: NO GROWTH
Culture: NO GROWTH
Special Requests: ADEQUATE

## 2020-02-24 MED ORDER — IPRATROPIUM-ALBUTEROL 0.5-2.5 (3) MG/3ML IN SOLN
3.0000 mL | Freq: Two times a day (BID) | RESPIRATORY_TRACT | Status: DC
  Administered 2020-02-25: 3 mL via RESPIRATORY_TRACT
  Filled 2020-02-24: qty 3

## 2020-02-24 MED ORDER — IOHEXOL 350 MG/ML SOLN
55.0000 mL | Freq: Once | INTRAVENOUS | Status: AC | PRN
  Administered 2020-02-24: 55 mL via INTRAVENOUS

## 2020-02-24 MED ORDER — SODIUM CHLORIDE 0.9 % IV SOLN: INTRAVENOUS | Status: DC

## 2020-02-24 MED ORDER — SODIUM CHLORIDE 0.9 % IV SOLN: INTRAVENOUS | Status: AC

## 2020-02-24 MED ORDER — TECHNETIUM TO 99M ALBUMIN AGGREGATED
4.0000 | Freq: Once | INTRAVENOUS | Status: AC | PRN
  Administered 2020-02-24: 4 via INTRAVENOUS

## 2020-02-24 NOTE — Progress Notes (Addendum)
PROGRESS NOTE    Corey Dixon  PIR:[id number] DOB: Sep 04, 1955 DOA: 02/18/2020 PCP: Arman Bogus., MD   Brief Narrative: 65 year old with past medical history significant for COPD, GERD, bladder cancer status post BCG who presented to [facility] due to increased work of breathing, chest pain that has been going on since his diagnosis of Covid on 1/10.  Patient had nausea and vomiting initially with the  diagnosis of Covid.  Subsequently that resolved.  He reports 10 pounds weight loss since his symptoms are started.  With worsening increased work of breathing.  Evaluation in the ED creatinine of 2.0, lactic acid 1.2.  Chest x-ray was suggestive of developing cavitary lesion in the left apex.  CT chest without contrast showed left upper lobe opacity which corresponds to a cavitary mass within left upper lobe possible representing postinflammatory cavitary lesion or a cavitary neoplasm.  AFB sputum ordered.  ID and pulmonologist has been consulted.  Patient develops chest pain 1/27, chest x ray showed free air abdomen. CT abdomen pelvis showed moderate Pneumoperitoneum of indeterminate etiology, concern for bowel perforation. Colonic diverticulosis, pulmonary and hepatic metastasis diseases. He was evaluated by surgery, his abdomen was benign, plan was to repeat CT scan with oral contrast. CT with contrast showed  No source of perforation, large volume of free air increase from prior CT scan, diffuse wall thickening of the stomach, multiple BL pulmonary nodule and 2 cm mass left hepatic lobe.   Patient and family discussed case with surgery, decision was made to proceed with diagnostic  laparoscopy abdomen 1/28.   Assessment & Plan:   Principal Problem:   Cavitary lesion of lung Active Problems:   COVID-19 virus infection   AKI (acute kidney injury) ([place])   COPD (chronic obstructive pulmonary disease) (HCC)   History of bladder cancer   Lung nodule   Mediastinal lymphadenopathy   S/P  bronchoscopy  1-Cavitary lung mass; Multiples Pulmonary nodule; Probably related to Malignancy. ID consulted, recommend to stop antibiotics, AFB sputum and pulmonary consultation.  Recent covid infection 10 days ago. PCR in hospital negative. Off isolation.  Follow: Cryptococcal antigen negative , histoplasma antigen, Legionella antigen,coccidiodes, Blastomyces antigen negative, acid-fast smear times 2 negative.  Underwent Bronchoscopy 1/26. Cytology: Non small cell lung cancer.  Oncology consulted.   I have called his oncologist as well, they will call patient with appointment.   2-COPD; continue with inhaler.  Started on prednisone taper dose.  Report dyspnea on exertion. Repeated chest x ray improvement of lung aeration.  I have schedule Albuterol/ipratropium.  Patient can use advair BID.   He report continue feeling of SOB. I have asked Dr Elsworth Soho to follow up on patient. I have schedule nebulizer.   Oxygen sat stable on RA, and on ambulation.    Pneumoperitoneum:  -Free air abdomen per chest x ray. -CT abdomen showed no source for Pneumoperitoneum. Worsening free air on CT scan 1/28 -Underwent laparoscopy 1/28, no source for perforation.  -Tolerating diet. Ok to discontinue Zosyn.    3-AKI: Improved with IV fluids Cr peak to 2.0. Cr down to 1.4--1.5---1.4--1.3  4-History of bladder cancer, S.P CBG.   5-Hypomagnesemia; replaced.  6-Anemia;  Folic acid low. Started  supplement.  Iron and B 12 normal.   7-HTN;  Continue with norvasc and oral hydralazine. Marland Kitchen PRN hydralazine.    8-Headache; MRI; negative for metastasis. Microhemorrhage. Neurology consulted.  Plan to controlled BP, if persist could consider CT to evaluate vein system.  Headaches improving.   Mild  hyponatremia. Resolved.   Chest pain, right side, radiate right arm;  Could be related post bronchoscopy.  Chest x ray negative for pneumothorax.  He relates he has had chest pain radiates to arm for weeks, is  worse with deep breathing.  VQ scan was indeterminate for PE.  He is at risk for PE due to cancer, recent covid, and report pleuritic chest pain. Wouldn't want to treat him empirically. Will proceed with CT angio, with IV fluids hydration.  Addendum; CT chest; negative for PE, did showed moderate pericardial effusion. Plan to get ECHO, cardiology will follow in consultation.   Estimated body mass index is 24.49 kg/m as calculated from the following:   Height as of this encounter: 6' (1.829 m).   Weight as of this encounter: 81.9 kg.   DVT prophylaxis: Lovenox Code Status: Full Code Family Communication: care discussed with patient and wife 1/29. Disposition Plan:  Status is: Inpatient  Remains inpatient appropriate because:Ongoing diagnostic testing needed not appropriate for outpatient work up   Dispo: The patient is from: Home              Anticipated d/c is to: Home              Anticipated d/c date is: 3 days              Patient currently is not medically stable to d/c.   Difficult to place patient No        Consultants:   Pulmonology  ID  Procedures:   None  Antimicrobials:    Subjective: He report improvement of headaches 50% with improvement of BP.  He is still having Right side chest pain, radiates to right arm.  Still with SOB on exertion.    Objective: Vitals:   02/23/20 2023 02/23/20 2347 02/24/20 0432 02/24/20 0904  BP: (!) 146/87 (!) 149/80 139/78 136/86  Pulse: 88 83 80 92  Resp: _0 Temp: 98.7 F (37.1 C) 98.4 F (36.9 C) 97.7 F (36.5 C) 97.9 F (36.6 C)  TempSrc: Oral Oral Oral Oral  SpO2: 94% 95% 95% 95%  Weight:      Height:        Intake/Output Summary (Last 24 hours) at 02/24/2020 1046 Last data filed at 02/24/2020 0900 Gross per 24 hour  Intake 2430.67 ml  Output 1160 ml  Net 1270.67 ml   Filed Weights   02/18/20 2027 02/20/20 0025 02/21/20 1347  Weight: 83.9 kg 81.9 kg 81.9 kg    Examination:  General exam:  more call, no acute distress Respiratory system: CTA Cardiovascular system:  S 1, S 2 RRR Gastrointestinal system: BS present, soft, nt Central nervous system: Alert Extremities: No edema   Data Reviewed: I have personally reviewed following labs and imaging studies  CBC: Recent Labs  Lab 02/18/20 2125 02/20/20 0318 02/21/20 0747 02/22/20 0721 02/23/20 0453 02/24/20 0457  WBC 9.5 13.4* 14.2* 14.9* 13.7* 13.1*  NEUTROABS 7.8*  --  13.0* 12.8* 11.0* 11.0*  HGB 12.0* 11.5* 12.5* 12.8* 13.1 12.9*  HCT 35.4* 33.2* 38.4* 39.0 39.6 38.3*  MCV 84.1 82.8 84.0 83.7 84.3 84.9  PLT 304 298 361 336 343 914   Basic Metabolic Panel: Recent Labs  Lab 02/20/20 0318 02/21/20 0747 02/22/20 0721 02/23/20 0453 02/24/20 0457  NA 135 134* 135 136 134*  K 4.4 4.4 4.5 4.3 4.0  CL 103 102 101 100 100  CO2 20* 20* 22 24 21*  GLUCOSE 165* 158* 139*  102* 158*  BUN 26* 27* 33* 30* 28*  CREATININE 1.46* 1.43* 1.59* 1.49* 1.39*  CALCIUM 9.0 9.2 8.9 9.2 8.7*  MG 1.5* 1.9 1.9  --   --   PHOS 3.3 3.4 2.9  --   --    GFR: Estimated Creatinine Clearance: 57.4 mL/min (A) (by C-G formula based on SCr of 1.39 mg/dL (H)). Liver Function Tests: Recent Labs  Lab 02/20/20 0318 02/21/20 0747 02/22/20 0721 02/23/20 0453 02/24/20 0457  AST _0 ALT 25 28 44 36 43  ALKPHOS 75 76 79 76 75  BILITOT 0.3 0.8 0.5 0.9 0.6  PROT 6.6 7.1 6.5 6.6 6.3*  ALBUMIN 3.2* 3.4* 3.2* 3.3* 3.4*   No results for input(s): LIPASE, AMYLASE in the last 168 hours. No results for input(s): AMMONIA in the last 168 hours. Coagulation Profile: Recent Labs  Lab 02/20/20 0318  INR 1.1   Cardiac Enzymes: No results for input(s): CKTOTAL, CKMB, CKMBINDEX, TROPONINI in the last 168 hours. BNP (last 3 results) No results for input(s): PROBNP in the last 8760 hours. HbA1C: No results for input(s): HGBA1C in the last 72 hours. CBG: No results for input(s): GLUCAP in the last 168 hours. Lipid Profile: No  results for input(s): CHOL, HDL, LDLCALC, TRIG, CHOLHDL, LDLDIRECT in the last 72 hours. Thyroid Function Tests: No results for input(s): TSH, T4TOTAL, FREET4, T3FREE, THYROIDAB in the last 72 hours. Anemia Panel: Recent Labs    02/22/20 0721  FERRITIN 208   Sepsis Labs: Recent Labs  Lab 02/18/20 2234 02/20/20 0318 02/23/20 0453  PROCALCITON  --  <0.10  --   LATICACIDVEN 1.2  --  1.2    Recent Results (from the past 240 hour(s))  Blood culture (routine x 2)     Status: None (Preliminary result)   Collection Time: 02/18/20 10:25 PM   Specimen: BLOOD  Result Value Ref Range Status   Specimen Description BLOOD LEFT ANTECUBITAL  Final   Special Requests   Final    BOTTLES DRAWN AEROBIC AND ANAEROBIC Blood Culture adequate volume   Culture   Final    NO GROWTH 4 DAYS Performed at [facility], [place] [address] [place], [place] [address]    Report Status PENDING  Incomplete  Blood culture (routine x 2)     Status: None (Preliminary result)   Collection Time: 02/18/20 10:30 PM   Specimen: BLOOD  Result Value Ref Range Status   Specimen Description BLOOD BLOOD RIGHT FOREARM  Final   Special Requests   Final    BOTTLES DRAWN AEROBIC AND ANAEROBIC Blood Culture adequate volume   Culture   Final    NO GROWTH 4 DAYS Performed at [facility], [place] [address] [place],  [address]    Report Status PENDING  Incomplete  SARS CORONAVIRUS 2 (TAT 6-24 HRS)     Status: None   Collection Time: 02/20/20  2:24 AM  Result Value Ref Range Status   SARS Coronavirus 2 NEGATIVE NEGATIVE Final    Comment: (NOTE) SARS-CoV-2 target nucleic acids are NOT DETECTED.  The SARS-CoV-2 RNA is generally detectable in upper and lower respiratory specimens during the acute phase of infection. Negative results do not preclude SARS-CoV-2 infection, do not rule out co-infections with other pathogens, and should not be used as the sole basis for treatment or other patient management  decisions. Negative results must be combined with clinical observations, patient history, and epidemiological information. The expected result is Negative.  Fact Sheet for Patients: SugarRoll.be  Fact Sheet for Healthcare Providers: https://www.woods-mathews.com/  This test is not yet approved or cleared by the [place] FDA and  has been authorized for detection and/or diagnosis of SARS-CoV-2 by FDA under an Emergency Use Authorization (EUA). This EUA will remain  in effect (meaning this test can be used) for the duration of the COVID-19 declaration under Se ction 564(b)(1) of the Act, 21 U.S.C. section 360bbb-3(b)(1), unless the authorization is terminated or revoked sooner.  Performed at [facility], [place] [address] [place], [place] [address]   Blastomyces Antigen     Status: None   Collection Time: 02/20/20  4:55 PM   Specimen: Blood  Result Value Ref Range Status   Blastomyces Antigen None Detected None Detected ng/mL Final    Comment: (NOTE) Results reported as ng/mL in 0.2 - 14.7 ng/mL range Results above the limit of detection but below 0.2 ng/mL are reported as 'Positive, Below the Limit of Quantification' Results above 14.7 ng/mL are reported as 'Positive, Above the Limit of Quantification'    Specimen Type SERUM  Final    Comment: (NOTE) Performed At: [facility] [place], [place] [id number] Noralyn Pick MD QP:[id number]   MRSA PCR Screening     Status: None   Collection Time: 02/20/20  6:46 PM   Specimen: Nasal Mucosa; Nasopharyngeal  Result Value Ref Range Status   MRSA by PCR NEGATIVE NEGATIVE Final    Comment:        The GeneXpert MRSA Assay (FDA approved for NASAL specimens only), is one component of a comprehensive MRSA colonization surveillance program. It is not intended to diagnose MRSA infection nor to guide or monitor treatment for MRSA  infections. Performed at [facility], [place] [address], [place], [place] [address]   Culture, BAL-quantitative     Status: None   Collection Time: 02/21/20  2:16 PM   Specimen: PATH Cytology washing; Lung  Result Value Ref Range Status   Specimen Description BRONCHIAL WASHINGS  Final   Special Requests LUL SAMPLE A  Final   Gram Stain   Final    FEW WBC PRESENT, PREDOMINANTLY MONONUCLEAR NO ORGANISMS SEEN    Culture   Final    NO GROWTH 2 DAYS Performed at [facility], [place] [address], [place], [place] [address]    Report Status 02/23/2020 FINAL  Final  Acid Fast Smear (AFB)     Status: None   Collection Time: 02/21/20  2:16 PM   Specimen: PATH Cytology washing; Lung  Result Value Ref Range Status   AFB Specimen Processing Concentration  Final   Acid Fast Smear Negative  Final    Comment: (NOTE) Performed At: [facility] [place], [place] [id number] Rush Farmer MD RA:[id number]    Source (AFB) BRONCHIAL WASHINGS  Final    Comment: LUL SAMPLE A Performed at [facility], [place] [address] [place], [place] [address]   Acid Fast Smear (AFB)     Status: None   Collection Time: 02/21/20  2:49 PM   Specimen: PATH Cytology FNA; Node  Result Value Ref Range Status   AFB Specimen Processing Concentration  Final   Acid Fast Smear Negative  Final    Comment: (NOTE) Performed At: [facility] [place], [place] [id number] Rush Farmer MD AJ:[id number]    Source (AFB) NEEDLE ASPIRATE SAMPLE E  Final    Comment: Performed at [facility], [place] [address], [place], [place] [address]         Radiology Studies: CT ABDOMEN PELVIS WO CONTRAST  Result Date: 02/23/2020 CLINICAL DATA:  Concern for perforation. EXAM: CT ABDOMEN AND PELVIS WITHOUT CONTRAST TECHNIQUE: Multidetector CT imaging of the abdomen and pelvis was performed following the standard protocol without IV contrast. COMPARISON:  February 22, 2020  FINDINGS: Lower chest: Multiple bilateral pulmonary nodules are noted.The heart size is normal. Hepatobiliary: There is a 2 cm mass within the left hepatic lobe. Normal gallbladder.There is no biliary ductal dilation. Pancreas: Normal contours without ductal dilatation. No peripancreatic fluid collection. Spleen: Unremarkable. Adrenals/Urinary Tract: --Adrenal glands: Unremarkable. --Right kidney/ureter: The right kidney is absent. --Left kidney/ureter: No hydronephrosis or radiopaque kidney stones. --Urinary bladder: Unremarkable. Stomach/Bowel: --Stomach/Duodenum: A there is diffuse wall thickening of the stomach. --Small bowel: Unremarkable. --Colon: There is a large amount of stool throughout the colon. There are scattered colonic diverticula without CT evidence for diverticulitis. --Appendix: Normal. Vascular/Lymphatic: Atherosclerotic calcification is present within the non-aneurysmal abdominal aorta, without hemodynamically significant stenosis. --there are no pathologically enlarged retroperitoneal lymph nodes. --No mesenteric lymphadenopathy. --No pelvic or inguinal lymphadenopathy. Reproductive: Unremarkable Other: There is a large volume of free air, increased from prior study. No significant free fluid. No extraluminal oral contrast. Musculoskeletal. There is a sclerotic lesion in the L3 vertebral body. There are end-stage degenerative changes of the left hip. IMPRESSION: 1. Large volume of free air, increased from prior study. No extraluminal oral contrast. While the exact site of perforation is not visualized, the stomach is favored to be the source. 2. Diffuse wall thickening of the stomach, concerning for gastritis. 3. Multiple bilateral pulmonary nodules, concerning for metastatic disease. 4. Indeterminate 2 cm mass within the left hepatic lobe, also concerning for metastatic disease. 5. Sclerotic lesion in the L3 vertebral body, concerning for osseous metastatic disease. 6. End-stage degenerative  changes of the left hip. 7. Large amount of stool throughout the colon. Aortic Atherosclerosis (ICD10-I70.0). Electronically Signed   By: Constance Holster M.D.   On: 02/23/2020 00:44   CT ABDOMEN PELVIS WO CONTRAST  Result Date: 02/22/2020 CLINICAL DATA:  65 year old male with concern for peritonitis or perforation. EXAM: CT ABDOMEN AND PELVIS WITHOUT CONTRAST TECHNIQUE: Multidetector CT imaging of the abdomen and pelvis was performed following the standard protocol without IV contrast. COMPARISON:  Chest radiograph dated 02/22/2020. FINDINGS: Evaluation of this exam is limited in the absence of intravenous contrast. Lower chest: There is background of emphysema. Right lung base nodular and streaky densities may represent atelectasis but concerning for infiltrate. Several scattered bilateral pulmonary nodules measure up to 11 mm also seen on the prior CT in keeping with metastatic disease. There is moderate pneumoperitoneum. No free fluid. Hepatobiliary: There is a 17 mm hypodense lesion in the left lobe of the liver which is not characterized on this CT, but most consistent with metastatic disease. No intrahepatic biliary dilatation. The gallbladder is unremarkable. Pancreas: Unremarkable. No pancreatic ductal dilatation or surrounding inflammatory changes. Spleen: Normal in size without focal abnormality. Adrenals/Urinary Tract: The adrenal glands unremarkable. There is a solitary left kidney. There is no hydronephrosis or nephrolithiasis of the left kidney. The left ureter and urinary bladder appear unremarkable. Stomach/Bowel: There is moderate stool throughout the colon. There is sigmoid diverticulosis and scattered colonic diverticula without active inflammatory changes. There is no bowel obstruction or active inflammation. The appendix is normal. Vascular/Lymphatic: Mild aortoiliac atherosclerotic disease. The IVC is unremarkable. No portal venous gas. There  is no adenopathy. Reproductive: The prostate  and seminal vesicles are grossly unremarkable. No pelvic mass. Other: Small containing bilateral inguinal hernias as well as a small fat containing umbilical hernia. Musculoskeletal: A 15 mm sclerotic lesion of L3. Severe degenerative changes of the left hip. No acute osseous pathology. Left L5 pars defect. IMPRESSION: 1. Moderate pneumoperitoneum of indeterminate etiology but concerning for bowel perforation. Clinical correlation and surgical consult is advised. 2. Colonic diverticulosis. No bowel obstruction. Normal appendix. 3. Solitary left kidney. No hydronephrosis or nephrolithiasis. 4. Pulmonary and hepatic metastatic disease. 5. A 15 mm sclerotic lesion of L3. 6. Aortic Atherosclerosis (ICD10-I70.0) and Emphysema (ICD10-J43.9). These results were called by telephone at the time of interpretation on 02/22/2020 at 7:06 pm to [place], who verbally acknowledged these results. Electronically Signed   By: Anner Crete M.D.   On: 02/22/2020 19:09   DG CHEST PORT 1 VIEW  Result Date: 02/22/2020 CLINICAL DATA:  Recent COVID infection with persistent shortness of breath EXAM: PORTABLE CHEST 1 VIEW COMPARISON:  02/21/2020 FINDINGS: Cardiac shadow is within normal limits. Lungs are well aerated bilaterally. Persistent but improved airspace opacity is noted consistent with the given clinical history. Lucency is noted beneath the right hemidiaphragm new from the previous exams suspicious for free intraperitoneal air. IMPRESSION: Persistent but improved aeration bilaterally consistent with the known history of COVID-19 positivity. Findings suggestive of free intraperitoneal air. CT of the abdomen and pelvis is recommended for further evaluation. Electronically Signed   By: Inez Catalina M.D.   On: 02/22/2020 11:22        Scheduled Meds: . amLODipine  10 mg Oral Daily  . vitamin C  500 mg Oral Daily  . escitalopram  20 mg Oral Daily  . Fluticasone-Salmeterol  1 puff Inhalation BID  . folic acid   1 mg Oral Daily  . hydrALAZINE  25 mg Oral BID  . ipratropium-albuterol  3 mL Nebulization BID  . melatonin  5 mg Oral QHS  . nicotine  14 mg Transdermal Daily  . pantoprazole (PROTONIX) IV  40 mg Intravenous Q12H  . predniSONE  40 mg Oral Q breakfast  . zinc sulfate  220 mg Oral Daily   Continuous Infusions: . sodium chloride 10 mL/hr at 02/23/20 2317  . lactated ringers 10 mL/hr at 02/23/20 1024     LOS: 4 days    Time spent: 35 minutes    Aurea Aronov A Crews Mccollam, MD [facility]   If 7PM-7AM, please contact night-coverage [email or web address]  02/24/2020, 10:46 AM

## 2020-02-24 NOTE — Progress Notes (Signed)
Progress Note: General Surgery Service   Chief Complaint/Subjective: Minimal pain, tolerating regular diet  Objective: Vital signs in last 24 hours: Temp:  [97.4 F (36.3 C)-98.7 F (37.1 C)] 97.9 F (36.6 C) (01/29 0904) Pulse Rate:  [80-98] 92 (01/29 0904) Resp:  [15-20] 20 (01/29 0432) BP: (115-149)/(73-87) 136/86 (01/29 0904) SpO2:  [93 %-99 %] 95 % (01/29 0904) Last BM Date: 02/21/20  Intake/Output from previous day: 01/28 0701 - 01/29 0700 In: 2280.7 [P.O.:420; I.V.:1572.5; IV Piggyback:288.2] Out: 1260 [Urine:1250; Blood:10] Intake/Output this shift: Total I/O In: 150 [I.V.:150] Out: 400 [Urine:400]  Gen: NAD  Resp: nonlabored  Card: RRR  Abd: soft, incisions c/d/i, nontender  Lab Results: CBC  Recent Labs    02/23/20 0453 02/24/20 0457  WBC 13.7* 13.1*  HGB 13.1 12.9*  HCT 39.6 38.3*  PLT 343 274   BMET Recent Labs    02/23/20 0453 02/24/20 0457  NA 136 134*  K 4.3 4.0  CL 100 100  CO2 24 21*  GLUCOSE 102* 158*  BUN 30* 28*  CREATININE 1.49* 1.39*  CALCIUM 9.2 8.7*   PT/INR No results for input(s): LABPROT, INR in the last 72 hours. ABG No results for input(s): PHART, HCO3 in the last 72 hours.  Invalid input(s): PCO2, PO2  Anti-infectives: Anti-infectives (From admission, onward)   Start     Dose/Rate Route Frequency Ordered Stop   02/22/20 2045  piperacillin-tazobactam (ZOSYN) IVPB 3.375 g  Status:  Discontinued        3.375 g 12.5 mL/hr over 240 Minutes Intravenous Every 8 hours 02/22/20 2043 02/23/20 1322   02/19/20 1330  vancomycin (VANCOCIN) IVPB 1000 mg/200 mL premix  Status:  Discontinued        1,000 mg 200 mL/hr over 60 Minutes Intravenous Every 24 hours 02/19/20 1316 02/20/20 1558   02/19/20 1330  ceFEPIme (MAXIPIME) 2 g in sodium chloride 0.9 % 100 mL IVPB  Status:  Discontinued        2 g 200 mL/hr over 30 Minutes Intravenous Every 12 hours 02/19/20 1316 02/20/20 1558   02/18/20 2315  ceFEPIme (MAXIPIME) 2 g in sodium  chloride 0.9 % 100 mL IVPB        2 g 200 mL/hr over 30 Minutes Intravenous  Once 02/18/20 2307 02/18/20 2356   02/18/20 2300  vancomycin (VANCOCIN) IVPB 1000 mg/200 mL premix        1,000 mg 200 mL/hr over 60 Minutes Intravenous  Once 02/18/20 2256 02/19/20 0243   02/18/20 2300  ceFEPIme (MAXIPIME) 1 g in sodium chloride 0.9 % 100 mL IVPB  Status:  Discontinued        1 g 200 mL/hr over 30 Minutes Intravenous  Once 02/18/20 2257 02/18/20 2307      Medications: Scheduled Meds: . amLODipine  10 mg Oral Daily  . vitamin C  500 mg Oral Daily  . escitalopram  20 mg Oral Daily  . Fluticasone-Salmeterol  1 puff Inhalation BID  . folic acid  1 mg Oral Daily  . hydrALAZINE  25 mg Oral BID  . ipratropium-albuterol  3 mL Nebulization BID  . melatonin  5 mg Oral QHS  . nicotine  14 mg Transdermal Daily  . pantoprazole (PROTONIX) IV  40 mg Intravenous Q12H  . predniSONE  40 mg Oral Q breakfast  . zinc sulfate  220 mg Oral Daily   Continuous Infusions: . sodium chloride 10 mL/hr at 02/23/20 2317  . lactated ringers 10 mL/hr at 02/23/20 1024  PRN Meds:.sodium chloride, acetaminophen, ALPRAZolam, alum & mag hydroxide-simeth, bisacodyl, chlorpheniramine-HYDROcodone, diphenhydrAMINE **OR** diphenhydrAMINE, EPINEPHrine, guaiFENesin-dextromethorphan, hydrALAZINE, morphine injection, ondansetron **OR** ondansetron (ZOFRAN) IV, phenol  Assessment/Plan: s/p Procedure(s): LIVER BIOPSY LAPAROSCOPY DIAGNOSTIC 02/23/2020 -continue diet -ok to stop antibiotics -ambulate -can shower    LOS: 4 days   Mickeal Skinner, MD [facility]

## 2020-02-24 NOTE — Consult Note (Signed)
[facility] NOTE  Patient Care Team: Arman Bogus., MD as PCP - General (Internal Medicine)  CHIEF COMPLAINTS/PURPOSE OF CONSULTATION:  SOB  ASSESSMENT & PLAN:  No problem-specific Assessment & Plan notes found for this encounter.  1. Multiple lung nodules, liver lesion and bone lesion noted on imaging Cytology showed malignant cells, insufficient sample , favored to represent non small cell carcinoma. Given history of bladder cancer, and multiple lung nodules and insufficient sample noted by pathology, I would recommend repeat biopsy to ensure this is actually NSCLC and not metastatic bladder cancer which can present with multiple lung nodules. Even if this were NSCLC, we will need additional sample for molecular testing and PDL1 which is standard of care. Outpatient PET should be considered. Discussed the noncurative intent of treatment and advanced stage despite the primary. He would like to FU with Dr Theda Sers, his oncologist in [facility]. This is reasonable, encouraged him to call them on Monday and make an appointment ASAP.  Thank you for the consult. Please do not hesitate to contact us with any additional questions or concerns.   HISTORY OF PRESENTING ILLNESS:  Corey Dixon 65 y.o. male is here because of SOB.  Corey Dixon is a pleasant 65 yr old male patient with PMH significant for bladder cancer  First diagnosed in 2009  Most recently received neoadjuvant cis/gem for 4 cycles and had right nephroureterectomy in 2019 now presents with COVID 19 infection and worsening SOB.  Oncology History  Bladder cancer (*)  2009 Initial Diagnosis  Bladder cancer (*), [place], [place], TURBT and BCG  09/15/2016 Surgery  TURBT, CIS  Malignant neoplasm of right renal pelvis (*)  09/15/2016 Initial Diagnosis  Malignant neoplasm of right renal pelvis (*), Ureter Brushing and Washing, right kidney (ThinPrep): Atypical urothelial cells, suspicious for malignancy  04/15/2017  Cancer Staged  CT, Persistent thickening and enhancement of the right renal pelvis, portions of the intrarenal collecting system and of the dilated right ureter similar to prior study, question infection.  06/08/2017 Surgery  Right ureteroscopy, Kidney, right pelvis, biopsy: Fragments of high-grade urothelial carcinoma, invasive, stent placement  07/13/2017 - 09/21/2017 Chemotherapy  Gemcitabine and cisplatin, 4 cycles  10/22/2017 Surgery  Right kidney and ureter, nephroureterectomy: Segment of ureter, and renal pelvis with chronic inflammation and therapy related changes. See note. No definite evidence of residual carcinoma. Background kidney parenchyma with chronic tubulointerstitial inflammation. Surgical margins are negative for tumor.  He was found to have LUL opacity corresponding to a cavitary mass within LUL possibly representing infection vs cavitary neoplasm Innumerable pulmonary nodules and pathologic mediastinal lymphadenopathy noted. CT abdomen without contrast showed moderate pneumoperitoneum of indeterminate etiology but concerning for bowel perforation. Pulmonary and hepatic metastatic disease. 15 mm sclerotic lesion of L3.  Oncology was consulted to discuss recommendations. He feels much better, SOB improved, denies any pain issues today, doing well overall. He does admit to slowly worsening SOB for the past many days even before COVID but when COVID hit, it became unbearable. He lost about 10-12 lbs in the past 3 weeks. No change in bowel habit No change in urinary habits No new neurological complaints.  Rest of the pertinent 10 point ROS reviewed and negative. Wife on the phone at the time of the discussion  MEDICAL HISTORY:  Past Medical History:  Diagnosis Date  . Bladder cancer ([place])   . COPD (chronic obstructive pulmonary disease) ([place])     SURGICAL HISTORY: Past Surgical History:  Procedure Laterality Date  .  BRONCHIAL BRUSHINGS  02/21/2020   Procedure:  BRONCHIAL BRUSHINGS;  Surgeon: Rigoberto Noel, MD;  Location: [facility] ENDOSCOPY;  Service: Cardiopulmonary;;  . BRONCHIAL NEEDLE ASPIRATION BIOPSY  02/21/2020   Procedure: BRONCHIAL NEEDLE ASPIRATION BIOPSIES;  Surgeon: Rigoberto Noel, MD;  Location: [place];  Service: Cardiopulmonary;;  . BRONCHIAL WASHINGS  02/21/2020   Procedure: BRONCHIAL WASHINGS;  Surgeon: Rigoberto Noel, MD;  Location: [place];  Service: Cardiopulmonary;;  . ENDOBRONCHIAL ULTRASOUND N/A 02/21/2020   Procedure: ENDOBRONCHIAL ULTRASOUND;  Surgeon: Rigoberto Noel, MD;  Location: [place];  Service: Cardiopulmonary;  Laterality: N/A;  . LAPAROSCOPY  02/23/2020   Procedure: LAPAROSCOPY DIAGNOSTIC;  Surgeon: Clovis Riley, MD;  Location: [place];  Service: General;;  . LIVER BIOPSY  02/23/2020   Procedure: LIVER BIOPSY;  Surgeon: Clovis Riley, MD;  Location: [place];  Service: General;;  . RENAL ARTERY STENT  2009, 09/15/2016  . VIDEO BRONCHOSCOPY N/A 02/21/2020   Procedure: VIDEO BRONCHOSCOPY WITH FLUORO;  Surgeon: Rigoberto Noel, MD;  Location: [place];  Service: Cardiopulmonary;  Laterality: N/A;    SOCIAL HISTORY: Social History   Socioeconomic History  . Marital status: Married    Spouse name: Not on file  . Number of children: Not on file  . Years of education: Not on file  . Highest education level: Not on file  Occupational History  . Not on file  Tobacco Use  . Smoking status: Former Smoker    Types: E-cigarettes  . Smokeless tobacco: Never Used  Substance and Sexual Activity  . Alcohol use: Yes  . Drug use: Not on file  . Sexual activity: Not on file  Other Topics Concern  . Not on file  Social History Narrative  . Not on file   Social Determinants of Health   Financial Resource Strain: Not on file  Food Insecurity: Not on file  Transportation Needs: Not on file  Physical Activity: Not on file  Stress: Not on file  Social Connections: Not on file  Intimate Partner Violence: Not  on file    FAMILY HISTORY: History reviewed. No pertinent family history.  ALLERGIES:  is allergic to penicillins.  MEDICATIONS:  Current Facility-Administered Medications  Medication Dose Route Frequency Provider Last Rate Last Admin  . 0.9 %  sodium chloride infusion   Intravenous PRN Norm Parcel, PA-C 10 mL/hr at 02/23/20 2317 New Bag at 02/23/20 2317  . 0.9 %  sodium chloride infusion   Intravenous Continuous Regalado, Belkys A, MD 100 mL/hr at 02/24/20 1406 New Bag at 02/24/20 1406  . acetaminophen (TYLENOL) tablet 650 mg  650 mg Oral Q6H PRN Norm Parcel, PA-C   650 mg at 02/22/20 1732  . ALPRAZolam Duanne Moron) tablet 0.25 mg  0.25 mg Oral BID PRN Regalado, Belkys A, MD   0.25 mg at 02/24/20 0921  . alum & mag hydroxide-simeth (MAALOX/MYLANTA) 200-200-20 MG/5ML suspension 15 mL  15 mL Oral Q6H PRN Regalado, Belkys A, MD      . amLODipine (NORVASC) tablet 10 mg  10 mg Oral Daily Norm Parcel, PA-C   10 mg at 02/24/20 1505  . ascorbic acid (VITAMIN C) tablet 500 mg  500 mg Oral Daily Norm Parcel, PA-C   500 mg at 02/24/20 6979  . bisacodyl (DULCOLAX) EC tablet 5 mg  5 mg Oral Daily PRN Norm Parcel, PA-C      . chlorpheniramine-HYDROcodone (TUSSIONEX) 10-8 MG/5ML suspension 5 mL  5 mL Oral Q12H PRN  Norm Parcel, PA-C      . diphenhydrAMINE (BENADRYL) capsule 25 mg  25 mg Oral Once PRN Norm Parcel, PA-C       Or  . diphenhydrAMINE (BENADRYL) injection 25 mg  25 mg Intramuscular Once PRN Norm Parcel, PA-C      . EPINEPHrine (EPI-PEN) injection 0.3 mg  0.3 mg Intramuscular Once PRN Norm Parcel, PA-C      . escitalopram (LEXAPRO) tablet 20 mg  20 mg Oral Daily Regalado, Belkys A, MD   20 mg at 02/24/20 0903  . Fluticasone-Salmeterol (ADVAIR) 100-50 MCG/DOSE inhaler 1 puff  1 puff Inhalation BID Norm Parcel, PA-C   1 puff at 02/24/20 1916  . folic acid (FOLVITE) tablet 1 mg  1 mg Oral Daily Norm Parcel, PA-C   1 mg at 02/24/20 6060  .  guaiFENesin-dextromethorphan (ROBITUSSIN DM) 100-10 MG/5ML syrup 10 mL  10 mL Oral Q4H PRN Norm Parcel, PA-C      . hydrALAZINE (APRESOLINE) injection 10 mg  10 mg Intravenous Q6H PRN Barkley Boards R, PA-C   10 mg at 02/23/20 0953  . hydrALAZINE (APRESOLINE) tablet 25 mg  25 mg Oral BID Norm Parcel, PA-C   25 mg at 02/24/20 0459  . ipratropium-albuterol (DUONEB) 0.5-2.5 (3) MG/3ML nebulizer solution 3 mL  3 mL Nebulization BID Regalado, Belkys A, MD      . lactated ringers infusion   Intravenous Continuous Norm Parcel, PA-C 10 mL/hr at 02/23/20 1024 Restarted at 02/23/20 1032  . melatonin tablet 5 mg  5 mg Oral QHS Norm Parcel, PA-C   5 mg at 02/23/20 2134  . morphine 2 MG/ML injection 2 mg  2 mg Intravenous Q3H PRN Barkley Boards R, PA-C   2 mg at 02/24/20 1520  . nicotine (NICODERM CQ - dosed in mg/24 hours) patch 14 mg  14 mg Transdermal Daily Regalado, Belkys A, MD   14 mg at 02/24/20 0904  . ondansetron (ZOFRAN) tablet 4 mg  4 mg Oral Q6H PRN Norm Parcel, PA-C       Or  . ondansetron [facility]) injection 4 mg  4 mg Intravenous Q6H PRN Norm Parcel, PA-C      . pantoprazole (PROTONIX) injection 40 mg  40 mg Intravenous Q12H Norm Parcel, PA-C   40 mg at 02/24/20 9774  . phenol (CHLORASEPTIC) mouth spray 1 spray  1 spray Mouth/Throat PRN Regalado, Belkys A, MD   1 spray at 02/23/20 1554  . predniSONE (DELTASONE) tablet 40 mg  40 mg Oral Q breakfast Norm Parcel, PA-C   40 mg at 02/24/20 1423  . zinc sulfate capsule 220 mg  220 mg Oral Daily Norm Parcel, [place]   220 mg at 02/24/20 9532    PHYSICAL EXAMINATION:   Vitals:   02/24/20 0432 02/24/20 0904  BP: 139/78 136/86  Pulse: 80 92  Resp: 20   Temp: 97.7 F (36.5 C) 97.9 F (36.6 C)  SpO2: 95% 95%   Filed Weights   02/18/20 2027 02/20/20 0025 02/21/20 1347  Weight: 184 lb 15.5 oz (83.9 kg) 180 lb 8.9 oz (81.9 kg) 180 lb 8.9 oz (81.9 kg)    GENERAL:alert, no distress and comfortable SKIN:  skin color, texture, turgor are normal, no rashes or significant lesions EYES: normal, conjunctiva are pink and non-injected, sclera clear OROPHARYNX:no exudate, no erythema and lips, buccal mucosa, and tongue normal  NECK: supple, thyroid normal size, non-tender, without  nodularity LYMPH:  no palpable lymphadenopathy in the cervical, axillary or inguinal LUNGS: clear to auscultation in ant lung fields. HEART: regular rate & rhythm and no murmurs and no lower extremity edema ABDOMEN:abdomen soft, non-tender and normal bowel sounds Musculoskeletal:no cyanosis of digits and no clubbing  PSYCH: alert & oriented x 3 with fluent speech NEURO: no focal motor/sensory deficits  LABORATORY DATA:  I have reviewed the data as listed Lab Results  Component Value Date   WBC 13.1 (H) 02/24/2020   HGB 12.9 (L) 02/24/2020   HCT 38.3 (L) 02/24/2020   MCV 84.9 02/24/2020   PLT 274 02/24/2020   '@LASTCHEM' @  RADIOGRAPHIC STUDIES: I have personally reviewed the radiological images as listed and agreed with the findings in the report. CT ABDOMEN PELVIS WO CONTRAST  Result Date: 02/23/2020 CLINICAL DATA:  Concern for perforation. EXAM: CT ABDOMEN AND PELVIS WITHOUT CONTRAST TECHNIQUE: Multidetector CT imaging of the abdomen and pelvis was performed following the standard protocol without IV contrast. COMPARISON:  February 22, 2020 FINDINGS: Lower chest: Multiple bilateral pulmonary nodules are noted.The heart size is normal. Hepatobiliary: There is a 2 cm mass within the left hepatic lobe. Normal gallbladder.There is no biliary ductal dilation. Pancreas: Normal contours without ductal dilatation. No peripancreatic fluid collection. Spleen: Unremarkable. Adrenals/Urinary Tract: --Adrenal glands: Unremarkable. --Right kidney/ureter: The right kidney is absent. --Left kidney/ureter: No hydronephrosis or radiopaque kidney stones. --Urinary bladder: Unremarkable. Stomach/Bowel: --Stomach/Duodenum: A there is diffuse  wall thickening of the stomach. --Small bowel: Unremarkable. --Colon: There is a large amount of stool throughout the colon. There are scattered colonic diverticula without CT evidence for diverticulitis. --Appendix: Normal. Vascular/Lymphatic: Atherosclerotic calcification is present within the non-aneurysmal abdominal aorta, without hemodynamically significant stenosis. --there are no pathologically enlarged retroperitoneal lymph nodes. --No mesenteric lymphadenopathy. --No pelvic or inguinal lymphadenopathy. Reproductive: Unremarkable Other: There is a large volume of free air, increased from prior study. No significant free fluid. No extraluminal oral contrast. Musculoskeletal. There is a sclerotic lesion in the L3 vertebral body. There are end-stage degenerative changes of the left hip. IMPRESSION: 1. Large volume of free air, increased from prior study. No extraluminal oral contrast. While the exact site of perforation is not visualized, the stomach is favored to be the source. 2. Diffuse wall thickening of the stomach, concerning for gastritis. 3. Multiple bilateral pulmonary nodules, concerning for metastatic disease. 4. Indeterminate 2 cm mass within the left hepatic lobe, also concerning for metastatic disease. 5. Sclerotic lesion in the L3 vertebral body, concerning for osseous metastatic disease. 6. End-stage degenerative changes of the left hip. 7. Large amount of stool throughout the colon. Aortic Atherosclerosis (ICD10-I70.0). Electronically Signed   By: Constance Holster M.D.   On: 02/23/2020 00:44   CT ABDOMEN PELVIS WO CONTRAST  Result Date: 02/22/2020 CLINICAL DATA:  65 year old male with concern for peritonitis or perforation. EXAM: CT ABDOMEN AND PELVIS WITHOUT CONTRAST TECHNIQUE: Multidetector CT imaging of the abdomen and pelvis was performed following the standard protocol without IV contrast. COMPARISON:  Chest radiograph dated 02/22/2020. FINDINGS: Evaluation of this exam is limited in  the absence of intravenous contrast. Lower chest: There is background of emphysema. Right lung base nodular and streaky densities may represent atelectasis but concerning for infiltrate. Several scattered bilateral pulmonary nodules measure up to 11 mm also seen on the prior CT in keeping with metastatic disease. There is moderate pneumoperitoneum. No free fluid. Hepatobiliary: There is a 17 mm hypodense lesion in the left lobe of the liver which is not characterized  on this CT, but most consistent with metastatic disease. No intrahepatic biliary dilatation. The gallbladder is unremarkable. Pancreas: Unremarkable. No pancreatic ductal dilatation or surrounding inflammatory changes. Spleen: Normal in size without focal abnormality. Adrenals/Urinary Tract: The adrenal glands unremarkable. There is a solitary left kidney. There is no hydronephrosis or nephrolithiasis of the left kidney. The left ureter and urinary bladder appear unremarkable. Stomach/Bowel: There is moderate stool throughout the colon. There is sigmoid diverticulosis and scattered colonic diverticula without active inflammatory changes. There is no bowel obstruction or active inflammation. The appendix is normal. Vascular/Lymphatic: Mild aortoiliac atherosclerotic disease. The IVC is unremarkable. No portal venous gas. There is no adenopathy. Reproductive: The prostate and seminal vesicles are grossly unremarkable. No pelvic mass. Other: Small containing bilateral inguinal hernias as well as a small fat containing umbilical hernia. Musculoskeletal: A 15 mm sclerotic lesion of L3. Severe degenerative changes of the left hip. No acute osseous pathology. Left L5 pars defect. IMPRESSION: 1. Moderate pneumoperitoneum of indeterminate etiology but concerning for bowel perforation. Clinical correlation and surgical consult is advised. 2. Colonic diverticulosis. No bowel obstruction. Normal appendix. 3. Solitary left kidney. No hydronephrosis or  nephrolithiasis. 4. Pulmonary and hepatic metastatic disease. 5. A 15 mm sclerotic lesion of L3. 6. Aortic Atherosclerosis (ICD10-I70.0) and Emphysema (ICD10-J43.9). These results were called by telephone at the time of interpretation on 02/22/2020 at 7:06 pm to [place], who verbally acknowledged these results. Electronically Signed   By: Anner Crete M.D.   On: 02/22/2020 19:09   DG Chest 2 View  Result Date: 02/24/2020 CLINICAL DATA:  Shortness of breath with exertion. Chest pain. History of COPD and cavitary lung lesion. History of COVID-19. Status post liver biopsy and laparoscopy on 02/23/2020 and video bronchoscopy on 02/21/2020. EXAM: CHEST - 2 VIEW COMPARISON:  02/22/2020. Abdomen and pelvis CT dated 02/23/2020. Chest CT dated 02/18/2020. FINDINGS: Again demonstrated is free peritoneal air. Normal sized heart. No pneumothorax or pneumomediastinum. Stable multiple small ill-defined left lung densities and left upper lobe cavitary lesion. Multiple small bilateral lung nodules are better visualized on the previous CT. Unremarkable bones. IMPRESSION: 1. Stable free peritoneal air. 2. Stable left upper lobe cavitary lesion and multiple small bilateral lung nodules. Electronically Signed   By: Claudie Revering M.D.   On: 02/24/2020 11:37   CT CHEST WO CONTRAST  Result Date: 02/18/2020 CLINICAL DATA:  Chest pain, dyspnea, COVID positive EXAM: CT CHEST WITHOUT CONTRAST TECHNIQUE: Multidetector CT imaging of the chest was performed following the standard protocol without IV contrast. COMPARISON:  None. FINDINGS: Cardiovascular: Mild coronary artery calcification. Global cardiac size within normal limits. Trace pericardial effusion. The central pulmonary arteries are of normal caliber. Mild atherosclerotic calcification within the thoracic aorta. No aortic aneurysm. Mediastinum/Nodes: Pathologic right paratracheal and aortopulmonary lymphadenopathy is present with the index lymph node seen within the  right paratracheal region measuring 2.0 x 3.1 cm at axial image # 57. The esophagus is unremarkable. Thyroid unremarkable. Lungs/Pleura: Severe, apically predominant centrilobular emphysema. The irregular opacity noted within the left apex on prior chest radiograph corresponds to a oblong cavitary mass measuring 2.1 x 2.1 x 3.7 cm in greatest dimension on axial image # 30 and coronal image # 64. The lesion demonstrates a a mildly irregular soft tissue rind and a cavitary neoplasm is difficult to exclude. Alternatively, this may represent the residua of a cavitary infectious process. No superimposed focal pulmonary infiltrate is identified. There are scattered throughout the lungs in a random distribution demonstrating a lower lung  zone predominance at least 30 pulmonary nodules measuring up to 15 mm within the left upper lobe at axial image # 61. These are suspicious for pulmonary metastases in these may represent metastatic disease from an unknown primary versus is intra pulmonary metastases secondary to a primary pulmonary malignancy. No pneumothorax or pleural effusion. The central airways are widely patent. Upper Abdomen: A surgical staple line is seen inferior to the right adrenal gland which may reflect surgical changes of a right nephrectomy, though this is not definitively identified on this examination. No acute abnormality within the upper abdomen. Musculoskeletal: No lytic or blastic bone lesions are identified. IMPRESSION: Left upper lobe opacity corresponds to a cavitary mass within the left upper lobe possibly representing a post inflammatory cavity or, less likely, a cavitary neoplasm. Innumerable pulmonary nodules distributed randomly with a lower lobe predominance most in keeping with pulmonary metastatic disease. Pathologic mediastinal adenopathy also identified. Severe centrilobular emphysema. Aortic Atherosclerosis (ICD10-I70.0). Electronically Signed   By: Fidela Salisbury MD   On: 02/18/2020  22:42   [facility] BRAIN W WO CONTRAST  Result Date: 02/21/2020 CLINICAL DATA:  Headache, chronic, no new features; non-small cell lung cancer, staging. EXAM: MRI HEAD WITHOUT AND WITH CONTRAST TECHNIQUE: Multiplanar, multiecho pulse sequences of the brain and surrounding structures were obtained without and with intravenous contrast. CONTRAST:  51m GADAVIST GADOBUTROL 1 MMOL/ML IV SOLN COMPARISON:  No pertinent prior exams available for comparison. FINDINGS: Brain: Mild intermittent motion degradation. Mild cerebral and cerebellar atrophy. No abnormal intracranial enhancement is identified to suggest intracranial metastatic disease. Mild multifocal T2/FLAIR hyperintensity within the cerebral white matter is nonspecific, but compatible with chronic small vessel ischemic disease. An apparent punctate focus of T2/FLAIR hyperintensity within the left medulla is not appreciated on the axial or coronal T2 weighted sequences and is likely artifactual (series 7, image 3). Punctate focus of SWI signal loss within the right frontal lobe compatible with nonspecific chronic microhemorrhage. There is no acute infarct. No evidence of intracranial mass. No extra-axial fluid collection. No midline shift. Vascular: Expected proximal arterial flow voids. Skull and upper cervical spine: No focal marrow lesion. Sinuses/Orbits: Visualized orbits show no acute finding. Mild ethmoid sinus mucosal thickening. 1.5 cm left maxillary sinus mucous retention cyst. IMPRESSION: Mildly motion degraded examination. No intracranial metastatic disease is identified. No evidence of acute intracranial abnormality. Mild generalized atrophy of the brain and chronic small vessel ischemic disease. Nonspecific chronic microhemorrhage within the right frontal lobe. Mild ethmoid sinus mucosal thickening. 1.5 cm left maxillary sinus mucous retention cyst. Electronically Signed   By: KKellie SimmeringDO   On: 02/21/2020 18:59   NM Pulmonary Perf and Vent  Result  Date: 02/24/2020 CLINICAL DATA:  Chest pain. Shortness of breath. Bladder cancer. COPD. Headache. COVID-19 positive. EXAM: NUCLEAR MEDICINE PERFUSION LUNG SCAN TECHNIQUE: Perfusion images were obtained in multiple projections after intravenous injection of radiopharmaceutical. Ventilation scans intentionally deferred if perfusion scan and chest x-ray adequate for interpretation during COVID 19 epidemic. RADIOPHARMACEUTICALS:  4.3 mCi Tc-968mAA IV COMPARISON:  Chest radiographs dated 02/22/2020. Chest CT dated 02/18/2020. FINDINGS: Multiple subsegmental perfusion defects in the right lung in the and in the left upper lobe. Extensive bilateral bullous changes on the recent CT. IMPRESSION: COPD with associated multiple bilateral perfusion defects, greater on the right. This is indeterminate for the possibility of pulmonary embolism. Electronically Signed   By: StClaudie Revering.D.   On: 02/24/2020 13:01   DG CHEST PORT 1 VIEW  Result Date: 02/22/2020 CLINICAL DATA:  Recent COVID infection with persistent shortness of breath EXAM: PORTABLE CHEST 1 VIEW COMPARISON:  02/21/2020 FINDINGS: Cardiac shadow is within normal limits. Lungs are well aerated bilaterally. Persistent but improved airspace opacity is noted consistent with the given clinical history. Lucency is noted beneath the right hemidiaphragm new from the previous exams suspicious for free intraperitoneal air. IMPRESSION: Persistent but improved aeration bilaterally consistent with the known history of COVID-19 positivity. Findings suggestive of free intraperitoneal air. CT of the abdomen and pelvis is recommended for further evaluation. Electronically Signed   By: Inez Catalina M.D.   On: 02/22/2020 11:22   DG CHEST PORT 1 VIEW  Result Date: 02/21/2020 CLINICAL DATA:  History of COVID-19 positivity, recent bronchoscopy EXAM: PORTABLE CHEST 1 VIEW COMPARISON:  02/18/2020 FINDINGS: Cardiac shadow is within normal limits. The lungs are hyperinflated. Cavitary  lesion in the left upper lobe laterally is again seen. Known nodules are less well appreciated on this exam. No pneumothorax is noted following bronchoscopy. IMPRESSION: No evidence of post bronchoscopy pneumothorax. Electronically Signed   By: Inez Catalina M.D.   On: 02/21/2020 15:59   DG Chest Port 1 View  Result Date: 02/18/2020 CLINICAL DATA:  Chest pain and shortness of breath since positive COVID test from 2 weeks ago. EXAM: PORTABLE CHEST 1 VIEW COMPARISON:  07/18/2017 FINDINGS: Heart size and pulmonary vascularity are normal. Emphysematous changes in the lungs. Scattered fibrosis. There is suggestion of a developing cavitary lesion in the left apex measuring 2.4 cm diameter. CT suggested for further evaluation. No pleural effusions. No pneumothorax. Mediastinal contours appear intact. IMPRESSION: 1. Suggestion of developing cavitary lesion in the left apex. CT suggested for further evaluation. 2. Emphysematous changes and fibrosis in the lungs. Electronically Signed   By: Lucienne Capers M.D.   On: 02/18/2020 21:15   DG C-ARM BRONCHOSCOPY  Result Date: 02/21/2020 C-ARM BRONCHOSCOPY: Fluoroscopy was utilized by the requesting physician.  No radiographic interpretation.   Reviewed imaging and cytology reports.  All questions were answered. The patient knows to call the clinic with any problems, questions or concerns. I spent 70 minutes in the care of this patient including H and P, review of records, counseling and coordination of care.     Benay Pike, MD 02/24/2020 4:23 PM

## 2020-02-24 NOTE — Evaluation (Signed)
Physical Therapy Evaluation & Discharge Patient Details Name: Corey Dixon MRN: [id number] DOB: 1955-08-31 Today's Date: 02/24/2020   History of Present Illness  65 y.o. male with medical history significant for COPD, GERD and bladder cancer s/p BCG who presents to [facility] ED due to increased work of breathing, reproducible chest pain that has been ongoing since diagnosed with COVID on 1/10. Found to have cavitary lesions in left upper lobe of lung. Patient with non-small cell lung cancer stage 3P.  Clinical Impression  PTA, patient lives with wife and independent with mobility. Patient overall modI with mobility. Patient is limited by decreased activity tolerance, encouraged ambulating in hallway throughout the day to improve endurance. Patient with noted SOB during ambulation with spO2 >96% throughout. Patient does not require skilled PT needs at this time. No PT follow up recommended.     Follow Up Recommendations No PT follow up    Equipment Recommendations  None recommended by PT    Recommendations for Other Services       Precautions / Restrictions Restrictions Weight Bearing Restrictions: No      Mobility  Bed Mobility Overal bed mobility: Modified Independent                  Transfers Overall transfer level: Modified independent Equipment used: None                Ambulation/Gait Ambulation/Gait assistance: Modified independent (Device/Increase time) Gait Distance (Feet): 400 Feet Assistive device: None Gait Pattern/deviations: WFL(Within Functional Limits)     General Gait Details: noted SOB, however spO2 maintained >96% throughout ambulation  Stairs            Wheelchair Mobility    Modified Rankin (Stroke Patients Only)       Balance Overall balance assessment: No apparent balance deficits (not formally assessed)                                           Pertinent Vitals/Pain Pain Assessment: Faces Faces Pain  Scale: No hurt    Home Living Family/patient expects to be discharged to:: Private residence Living Arrangements: Spouse/significant other Available Help at Discharge: Family;Available 24 hours/day Type of Home: House Home Access: Level entry     Home Layout: One level Home Equipment: None      Prior Function Level of Independence: Independent               Hand Dominance        Extremity/Trunk Assessment   Upper Extremity Assessment Upper Extremity Assessment: Overall WFL for tasks assessed    Lower Extremity Assessment Lower Extremity Assessment: Overall WFL for tasks assessed       Communication   Communication: No difficulties  Cognition Arousal/Alertness: Awake/alert Behavior During Therapy: WFL for tasks assessed/performed Overall Cognitive Status: Within Functional Limits for tasks assessed                                        General Comments      Exercises     Assessment/Plan    PT Assessment Patent does not need any further PT services  PT Problem List         PT Treatment Interventions      PT Goals (Current goals can be found in the  Care Plan section)  Acute Rehab PT Goals Patient Stated Goal: to go home PT Goal Formulation: With patient Potential to Achieve Goals: Good    Frequency     Barriers to discharge        Co-evaluation               AM-PAC PT "6 Clicks" Mobility  Outcome Measure Help needed turning from your back to your side while in a flat bed without using bedrails?: None Help needed moving from lying on your back to sitting on the side of a flat bed without using bedrails?: None Help needed moving to and from a bed to a chair (including a wheelchair)?: None Help needed standing up from a chair using your arms (e.g., wheelchair or bedside chair)?: None Help needed to walk in hospital room?: None Help needed climbing 3-5 steps with a railing? : None 6 Click Score: 24    End of Session    Activity Tolerance: Patient tolerated treatment well Patient left: in bed;with call bell/phone within reach;with family/visitor present Nurse Communication: Mobility status PT Visit Diagnosis: Muscle weakness (generalized) (M62.81)    Time: 1431-1501 PT Time Calculation (min) (ACUTE ONLY): 30 min   Charges:   PT Evaluation $PT Eval Low Complexity: 1 Low          Phoebe Marter A. Gilford Rile PT, DPT Acute Rehabilitation Services Pager [phone] Office [phone]   Alda Lea 02/24/2020, 3:15 PM

## 2020-02-25 ENCOUNTER — Other Ambulatory Visit: Payer: Self-pay | Admitting: Physician Assistant

## 2020-02-25 ENCOUNTER — Inpatient Hospital Stay (HOSPITAL_COMMUNITY): Payer: Medicare Other

## 2020-02-25 DIAGNOSIS — I3139 Other pericardial effusion (noninflammatory): Secondary | ICD-10-CM

## 2020-02-25 DIAGNOSIS — I313 Pericardial effusion (noninflammatory): Secondary | ICD-10-CM | POA: Diagnosis not present

## 2020-02-25 DIAGNOSIS — I300 Acute nonspecific idiopathic pericarditis: Secondary | ICD-10-CM | POA: Diagnosis not present

## 2020-02-25 LAB — BASIC METABOLIC PANEL
Anion gap: 11 (ref 5–15)
BUN: 25 mg/dL — ABNORMAL HIGH (ref 8–23)
CO2: 25 mmol/L (ref 22–32)
Calcium: 8.9 mg/dL (ref 8.9–10.3)
Chloride: 99 mmol/L (ref 98–111)
Creatinine, Ser: 1.43 mg/dL — ABNORMAL HIGH (ref 0.61–1.24)
GFR, Estimated: 54 mL/min — ABNORMAL LOW (ref >60)
Glucose, Bld: 128 mg/dL — ABNORMAL HIGH (ref 70–99)
Potassium: 3.9 mmol/L (ref 3.5–5.1)
Sodium: 135 mmol/L (ref 135–145)

## 2020-02-25 LAB — ECHOCARDIOGRAM COMPLETE
Height: 72 in
Single Plane A4C EF: 70.4 %
Weight: 2832.47 oz

## 2020-02-25 MED ORDER — POLYETHYLENE GLYCOL 3350 17 G PO PACK
17.0000 g | PACK | Freq: Two times a day (BID) | ORAL | Status: DC
  Administered 2020-02-25: 17 g via ORAL
  Filled 2020-02-25: qty 1

## 2020-02-25 MED ORDER — MELATONIN 5 MG PO TABS: 5.0000 mg | ORAL_TABLET | Freq: Every day | ORAL | 0 refills | Status: AC

## 2020-02-25 MED ORDER — POLYETHYLENE GLYCOL 3350 17 G PO PACK: 17.0000 g | PACK | Freq: Two times a day (BID) | ORAL | 0 refills | Status: AC

## 2020-02-25 MED ORDER — FOLIC ACID 1 MG PO TABS: 1.0000 mg | ORAL_TABLET | Freq: Every day | ORAL | 0 refills | Status: AC

## 2020-02-25 MED ORDER — NICOTINE 14 MG/24HR TD PT24: 14.0000 mg | MEDICATED_PATCH | Freq: Every day | TRANSDERMAL | 0 refills | Status: DC

## 2020-02-25 MED ORDER — PERFLUTREN LIPID MICROSPHERE
1.0000 mL | INTRAVENOUS | Status: AC | PRN
  Administered 2020-02-25: 2 mL via INTRAVENOUS
  Filled 2020-02-25: qty 10

## 2020-02-25 MED ORDER — COLCHICINE 0.6 MG PO TABS
0.6000 mg | ORAL_TABLET | Freq: Every day | ORAL | Status: DC
  Administered 2020-02-25: 0.6 mg via ORAL
  Filled 2020-02-25: qty 1

## 2020-02-25 MED ORDER — COLCHICINE 0.6 MG PO TABS: 0.6000 mg | ORAL_TABLET | Freq: Every day | ORAL | 3 refills | Status: DC

## 2020-02-25 MED ORDER — ACETAMINOPHEN 325 MG PO TABS: 650.0000 mg | ORAL_TABLET | Freq: Four times a day (QID) | ORAL | 0 refills | Status: AC | PRN

## 2020-02-25 MED ORDER — AMLODIPINE BESYLATE 10 MG PO TABS: 10.0000 mg | ORAL_TABLET | Freq: Every day | ORAL | 3 refills | Status: DC

## 2020-02-25 MED ORDER — BISACODYL 5 MG PO TBEC: 5.0000 mg | DELAYED_RELEASE_TABLET | Freq: Every day | ORAL | 0 refills | Status: DC | PRN

## 2020-02-25 MED ORDER — PREDNISONE 10 MG PO TABS: ORAL_TABLET | ORAL | 0 refills | Status: DC

## 2020-02-25 MED ORDER — PANTOPRAZOLE SODIUM 40 MG PO TBEC
40.0000 mg | DELAYED_RELEASE_TABLET | Freq: Two times a day (BID) | ORAL | Status: DC
  Administered 2020-02-25: 40 mg via ORAL
  Filled 2020-02-25: qty 1

## 2020-02-25 MED ORDER — SENNA 8.6 MG PO TABS
1.0000 | ORAL_TABLET | Freq: Every day | ORAL | Status: DC
  Administered 2020-02-25: 8.6 mg via ORAL
  Filled 2020-02-25: qty 1

## 2020-02-25 MED ORDER — HYDRALAZINE HCL 25 MG PO TABS: 25.0000 mg | ORAL_TABLET | Freq: Two times a day (BID) | ORAL | 0 refills | Status: DC

## 2020-02-25 MED ORDER — TRAMADOL HCL 50 MG PO TABS: 50.0000 mg | ORAL_TABLET | Freq: Four times a day (QID) | ORAL | 0 refills | Status: AC | PRN

## 2020-02-25 NOTE — Progress Notes (Signed)
DISCHARGE NOTE HOME [name] to be discharged Home per MD order. Discussed prescriptions and follow up appointments with the patient. Prescriptions given to patient; medication list explained in detail. Patient verbalized understanding.  Skin clean, dry and intact without evidence of skin break down, no evidence of skin tears noted. IV catheter discontinued intact. Site without signs and symptoms of complications. Dressing and pressure applied. Pt denies pain at the site currently. No complaints noted.  Patient free of lines, drains, and wounds.   An After Visit Summary (AVS) was printed and given to the patient. Patient escorted via wheelchair, and discharged home via private auto.  Orville Govern, RN

## 2020-02-25 NOTE — Consult Note (Addendum)
Cardiology Consultation:   Patient ID: Corey Dixon MRN: [id number]; DOB: 01/29/1955  Admit date: 02/18/2020 Date of Consult: 02/25/2020  Primary Care Provider: Arman Bogus., MD [facility] Cardiologist: Donato Heinz, MD [facility] Electrophysiologist:  None    Patient Profile:   Corey Dixon is a 65 y.o. male with a hx of HTN, bladder cancer (2009), s/p chemo and right nephrectomy (follows at [facility]), COPD, and former smoker who is being seen today for the evaluation of pericardial effusion at the request of Dr. Tyrell Antonio.   History of Present Illness:   Mr. Althaus does not currently follow with cardiology. He was diagnosed with COVID-19 on Feb 05 2020. Since that time, he has struggled with shortness of breath, N/V, and reproducible chest pain. Prior to admission, he was battling increased work of breathing and SOB. On arrival, he had AKI. COVID PCR negative. CT chest wo contrast showed left upper lobe opacity corresponding to a cavitary mass - question postinflammatory cavity or neoplasm. He also had numerous pulmonary nodules. ID consulted and started on empiric treatment. There was concern for TB - AFB negative. Bronchoscopy with samples taken - suspect NSCLC. Further CT imaging revealed lesions in his liver, and spine. Cytology consistent with malignant cells, but insufficient sample.  CT abdomen with pneumoperitoneum. Surgery was consulted and completed a laparoscopic evaluation on 02/23/20 with no source of perforation found.   In addition to the above, CT imaging yesterday demonstrated a moderate pericardial effusion that was larger than previous exam. He was hemodynamically stable and echocardiogram was ordered. Cardiology consulted.   During my interview, he denies history of MI or stroke, no PCI and does not remember a prior stress test. He follows with [facility] oncology. I discussed potential pericardial effusion, possible etiologies including malignant  effusion, and potential treatments. Discussion was limited as we do not have echo results yet.   He does report right upper chest wall pain. CP is worse with deep inspiration, lying flat, and is reproducible on palpation.  Echocardiogram pending.   Past Medical History:  Diagnosis Date  . Bladder cancer ([place])   . COPD (chronic obstructive pulmonary disease) ([place])     Past Surgical History:  Procedure Laterality Date  . BRONCHIAL BRUSHINGS  02/21/2020   Procedure: BRONCHIAL BRUSHINGS;  Surgeon: Rigoberto Noel, MD;  Location: [facility] ENDOSCOPY;  Service: Cardiopulmonary;;  . BRONCHIAL NEEDLE ASPIRATION BIOPSY  02/21/2020   Procedure: BRONCHIAL NEEDLE ASPIRATION BIOPSIES;  Surgeon: Rigoberto Noel, MD;  Location: [place];  Service: Cardiopulmonary;;  . BRONCHIAL WASHINGS  02/21/2020   Procedure: BRONCHIAL WASHINGS;  Surgeon: Rigoberto Noel, MD;  Location: [place];  Service: Cardiopulmonary;;  . ENDOBRONCHIAL ULTRASOUND N/A 02/21/2020   Procedure: ENDOBRONCHIAL ULTRASOUND;  Surgeon: Rigoberto Noel, MD;  Location: [place];  Service: Cardiopulmonary;  Laterality: N/A;  . LAPAROSCOPY  02/23/2020   Procedure: LAPAROSCOPY DIAGNOSTIC;  Surgeon: Clovis Riley, MD;  Location: [place];  Service: General;;  . LIVER BIOPSY  02/23/2020   Procedure: LIVER BIOPSY;  Surgeon: Clovis Riley, MD;  Location: [place];  Service: General;;  . RENAL ARTERY STENT  2009, 09/15/2016  . VIDEO BRONCHOSCOPY N/A 02/21/2020   Procedure: VIDEO BRONCHOSCOPY WITH FLUORO;  Surgeon: Rigoberto Noel, MD;  Location: [place];  Service: Cardiopulmonary;  Laterality: N/A;     Home Medications:  Prior to Admission medications   Medication Sig Start Date End Date Taking? Authorizing Provider  albuterol (VENTOLIN HFA) 108 (90 Base) MCG/ACT  inhaler Inhale 2 puffs into the lungs daily as needed for wheezing or shortness of breath. 11/11/15  Yes [provider]  escitalopram (LEXAPRO) 20 MG tablet Take 20 mg by  mouth daily. 02/03/20  Yes [provider]  Fluticasone-Salmeterol (ADVAIR) 100-50 MCG/DOSE AEPB Inhale 1 puff into the lungs in the morning and at bedtime. 03/01/15  Yes [provider]  omeprazole (PRILOSEC OTC) 20 MG tablet Take 20 mg by mouth daily.   Yes [provider]  tamsulosin (FLOMAX) 0.4 MG CAPS capsule Take 0.4 mg by mouth daily. 07/10/15  Yes [provider]    Inpatient Medications: Scheduled Meds: . amLODipine  10 mg Oral Daily  . vitamin C  500 mg Oral Daily  . escitalopram  20 mg Oral Daily  . Fluticasone-Salmeterol  1 puff Inhalation BID  . folic acid  1 mg Oral Daily  . hydrALAZINE  25 mg Oral BID  . ipratropium-albuterol  3 mL Nebulization BID  . melatonin  5 mg Oral QHS  . nicotine  14 mg Transdermal Daily  . pantoprazole (PROTONIX) IV  40 mg Intravenous Q12H  . predniSONE  40 mg Oral Q breakfast  . zinc sulfate  220 mg Oral Daily   Continuous Infusions: . sodium chloride 10 mL/hr at 02/23/20 2317  . lactated ringers 10 mL/hr at 02/23/20 1024   PRN Meds: sodium chloride, acetaminophen, ALPRAZolam, alum & mag hydroxide-simeth, bisacodyl, chlorpheniramine-HYDROcodone, diphenhydrAMINE **OR** diphenhydrAMINE, EPINEPHrine, guaiFENesin-dextromethorphan, hydrALAZINE, morphine injection, ondansetron **OR** ondansetron (ZOFRAN) IV, phenol  Allergies:    Allergies  Allergen Reactions  . Penicillins     Unknown, pt was a child    Social History:   Social History   Socioeconomic History  . Marital status: Married    Spouse name: Not on file  . Number of children: Not on file  . Years of education: Not on file  . Highest education level: Not on file  Occupational History  . Not on file  Tobacco Use  . Smoking status: Former Smoker    Types: E-cigarettes  . Smokeless tobacco: Never Used  Substance and Sexual Activity  . Alcohol use: Yes  . Drug use: Not on file  . Sexual activity: Not on file  Other Topics Concern  . Not on  file  Social History Narrative  . Not on file   Social Determinants of Health   Financial Resource Strain: Not on file  Food Insecurity: Not on file  Transportation Needs: Not on file  Physical Activity: Not on file  Stress: Not on file  Social Connections: Not on file  Intimate Partner Violence: Not on file    Family History:   History reviewed. No pertinent family history.   ROS:  Please see the history of present illness.   All other ROS reviewed and negative.     Physical Exam/Data:   Vitals:   02/24/20 1717 02/24/20 2010 02/25/20 0529 02/25/20 0812  BP: (!) 148/89 131/84 (!) 145/76   Pulse: 86 95 75   Resp: 18     Temp: 97.8 F (36.6 C) 97.6 F (36.4 C) (!) 97.5 F (36.4 C)   TempSrc: Oral Oral Oral   SpO2: 96% 96% 98% 98%  Weight:  80.3 kg    Height:        Intake/Output Summary (Last 24 hours) at 02/25/2020 0843 Last data filed at 02/25/2020 0532 Gross per 24 hour  Intake 1230.68 ml  Output 1200 ml  Net 30.68 ml   Last  3 Weights 02/24/2020 02/21/2020 02/20/2020  Weight (lbs) 177 lb 0.5 oz 180 lb 8.9 oz 180 lb 8.9 oz  Weight (kg) 80.3 kg 81.9 kg 81.9 kg     Body mass index is 24.01 kg/m.  General:  Elderly male, Well nourished, well developed, in no acute distress HEENT: normal Neck: no JVD Vascular: No carotid bruits Cardiac:  normal S1, S2; RRR; no murmur  Lungs:  Respirations unlabored, no crackles Abd: soft, nontender, no hepatomegaly  Ext: no edema Musculoskeletal:  No deformities, BUE and BLE strength normal and equal Skin: warm and dry  Neuro:  CNs 2-12 intact, no focal abnormalities noted Psych:  Normal affect   EKG:  The EKG was personally reviewed and demonstrates:  Sinus rhythm HR 85, PACs Telemetry:  Telemetry was personally reviewed and demonstrates:    Relevant CV Studies:  Echo - pending  Laboratory Data:  High Sensitivity Troponin:   Recent Labs  Lab 02/18/20 2125  TROPONINIHS 3     Chemistry Recent Labs  Lab  02/22/20 0721 02/23/20 0453 02/24/20 0457  NA 135 136 134*  K 4.5 4.3 4.0  CL 101 100 100  CO2 22 24 21*  GLUCOSE 139* 102* 158*  BUN 33* 30* 28*  CREATININE 1.59* 1.49* 1.39*  CALCIUM 8.9 9.2 8.7*  GFRNONAA 48* 51* 56*  ANIONGAP 12 12 13     Recent Labs  Lab 02/22/20 0721 02/23/20 0453 02/24/20 0457  PROT 6.5 6.6 6.3*  ALBUMIN 3.2* 3.3* 3.4*  AST 28 19 21   ALT 44 36 43  ALKPHOS 79 76 75  BILITOT 0.5 0.9 0.6   Hematology Recent Labs  Lab 02/22/20 0721 02/23/20 0453 02/24/20 0457  WBC 14.9* 13.7* 13.1*  RBC 4.66 4.70 4.51  HGB 12.8* 13.1 12.9*  HCT 39.0 39.6 38.3*  MCV 83.7 84.3 84.9  MCH 27.5 27.9 28.6  MCHC 32.8 33.1 33.7  RDW 12.3 12.4 12.6  PLT 336 343 274   BNPNo results for input(s): BNP, PROBNP in the last 168 hours.  DDimer  Recent Labs  Lab 02/21/20 0747 02/22/20 0721  DDIMER 0.74* 0.84*     Radiology/Studies:  CT ABDOMEN PELVIS WO CONTRAST  Result Date: 02/23/2020 CLINICAL DATA:  Concern for perforation. EXAM: CT ABDOMEN AND PELVIS WITHOUT CONTRAST TECHNIQUE: Multidetector CT imaging of the abdomen and pelvis was performed following the standard protocol without IV contrast. COMPARISON:  February 22, 2020 FINDINGS: Lower chest: Multiple bilateral pulmonary nodules are noted.The heart size is normal. Hepatobiliary: There is a 2 cm mass within the left hepatic lobe. Normal gallbladder.There is no biliary ductal dilation. Pancreas: Normal contours without ductal dilatation. No peripancreatic fluid collection. Spleen: Unremarkable. Adrenals/Urinary Tract: --Adrenal glands: Unremarkable. --Right kidney/ureter: The right kidney is absent. --Left kidney/ureter: No hydronephrosis or radiopaque kidney stones. --Urinary bladder: Unremarkable. Stomach/Bowel: --Stomach/Duodenum: A there is diffuse wall thickening of the stomach. --Small bowel: Unremarkable. --Colon: There is a large amount of stool throughout the colon. There are scattered colonic diverticula  without CT evidence for diverticulitis. --Appendix: Normal. Vascular/Lymphatic: Atherosclerotic calcification is present within the non-aneurysmal abdominal aorta, without hemodynamically significant stenosis. --there are no pathologically enlarged retroperitoneal lymph nodes. --No mesenteric lymphadenopathy. --No pelvic or inguinal lymphadenopathy. Reproductive: Unremarkable Other: There is a large volume of free air, increased from prior study. No significant free fluid. No extraluminal oral contrast. Musculoskeletal. There is a sclerotic lesion in the L3 vertebral body. There are end-stage degenerative changes of the left hip. IMPRESSION: 1. Large volume of free air, increased from  prior study. No extraluminal oral contrast. While the exact site of perforation is not visualized, the stomach is favored to be the source. 2. Diffuse wall thickening of the stomach, concerning for gastritis. 3. Multiple bilateral pulmonary nodules, concerning for metastatic disease. 4. Indeterminate 2 cm mass within the left hepatic lobe, also concerning for metastatic disease. 5. Sclerotic lesion in the L3 vertebral body, concerning for osseous metastatic disease. 6. End-stage degenerative changes of the left hip. 7. Large amount of stool throughout the colon. Aortic Atherosclerosis (ICD10-I70.0). Electronically Signed   By: Constance Holster M.D.   On: 02/23/2020 00:44   CT ABDOMEN PELVIS WO CONTRAST  Result Date: 02/22/2020 CLINICAL DATA:  65 year old male with concern for peritonitis or perforation. EXAM: CT ABDOMEN AND PELVIS WITHOUT CONTRAST TECHNIQUE: Multidetector CT imaging of the abdomen and pelvis was performed following the standard protocol without IV contrast. COMPARISON:  Chest radiograph dated 02/22/2020. FINDINGS: Evaluation of this exam is limited in the absence of intravenous contrast. Lower chest: There is background of emphysema. Right lung base nodular and streaky densities may represent atelectasis but  concerning for infiltrate. Several scattered bilateral pulmonary nodules measure up to 11 mm also seen on the prior CT in keeping with metastatic disease. There is moderate pneumoperitoneum. No free fluid. Hepatobiliary: There is a 17 mm hypodense lesion in the left lobe of the liver which is not characterized on this CT, but most consistent with metastatic disease. No intrahepatic biliary dilatation. The gallbladder is unremarkable. Pancreas: Unremarkable. No pancreatic ductal dilatation or surrounding inflammatory changes. Spleen: Normal in size without focal abnormality. Adrenals/Urinary Tract: The adrenal glands unremarkable. There is a solitary left kidney. There is no hydronephrosis or nephrolithiasis of the left kidney. The left ureter and urinary bladder appear unremarkable. Stomach/Bowel: There is moderate stool throughout the colon. There is sigmoid diverticulosis and scattered colonic diverticula without active inflammatory changes. There is no bowel obstruction or active inflammation. The appendix is normal. Vascular/Lymphatic: Mild aortoiliac atherosclerotic disease. The IVC is unremarkable. No portal venous gas. There is no adenopathy. Reproductive: The prostate and seminal vesicles are grossly unremarkable. No pelvic mass. Other: Small containing bilateral inguinal hernias as well as a small fat containing umbilical hernia. Musculoskeletal: A 15 mm sclerotic lesion of L3. Severe degenerative changes of the left hip. No acute osseous pathology. Left L5 pars defect. IMPRESSION: 1. Moderate pneumoperitoneum of indeterminate etiology but concerning for bowel perforation. Clinical correlation and surgical consult is advised. 2. Colonic diverticulosis. No bowel obstruction. Normal appendix. 3. Solitary left kidney. No hydronephrosis or nephrolithiasis. 4. Pulmonary and hepatic metastatic disease. 5. A 15 mm sclerotic lesion of L3. 6. Aortic Atherosclerosis (ICD10-I70.0) and Emphysema (ICD10-J43.9). These  results were called by telephone at the time of interpretation on 02/22/2020 at 7:06 pm to [place], who verbally acknowledged these results. Electronically Signed   By: Anner Crete M.D.   On: 02/22/2020 19:09   DG Chest 2 View  Result Date: 02/24/2020 CLINICAL DATA:  Shortness of breath with exertion. Chest pain. History of COPD and cavitary lung lesion. History of COVID-19. Status post liver biopsy and laparoscopy on 02/23/2020 and video bronchoscopy on 02/21/2020. EXAM: CHEST - 2 VIEW COMPARISON:  02/22/2020. Abdomen and pelvis CT dated 02/23/2020. Chest CT dated 02/18/2020. FINDINGS: Again demonstrated is free peritoneal air. Normal sized heart. No pneumothorax or pneumomediastinum. Stable multiple small ill-defined left lung densities and left upper lobe cavitary lesion. Multiple small bilateral lung nodules are better visualized on the previous CT. Unremarkable bones. IMPRESSION:  1. Stable free peritoneal air. 2. Stable left upper lobe cavitary lesion and multiple small bilateral lung nodules. Electronically Signed   By: Claudie Revering M.D.   On: 02/24/2020 11:37   CT ANGIO CHEST PE W OR WO CONTRAST  Result Date: 02/24/2020 CLINICAL DATA:  Chest pain or SOB, pleurisy or effusion suspected Worsening shortness of breath and chest pain since COVID diagnosis 02/05/2020. Laparoscopy yesterday. EXAM: CT ANGIOGRAPHY CHEST WITH CONTRAST TECHNIQUE: Multidetector CT imaging of the chest was performed using the standard protocol during bolus administration of intravenous contrast. Multiplanar CT image reconstructions and MIPs were obtained to evaluate the vascular anatomy. CONTRAST:  10mL OMNIPAQUE IOHEXOL 350 MG/ML SOLN COMPARISON:  Ventilation perfusion scan earlier today. Chest CT 02/18/2020 FINDINGS: Cardiovascular: There are no filling defects within the pulmonary arteries to suggest pulmonary embolus. Attenuation of upper lobe pulmonary arteries likely due to COPD. Heart is normal in size.  Moderate pericardial effusion measures up to 16 mm in depth anteriorly, increased from prior exam. There is no definite pericardial enhancement. Moderate aortic atherosclerosis without dissection or acute aortic abnormality. Left paratracheal adenopathy causes mild mass effect on the undersurface of the aortic arch. Dense contrast in the brachiocephalic vein obscures the origin of the great vessels. Mediastinum/Nodes: Multifocal mediastinal adenopathy. Conglomerate nodes in the lower paratracheal station, extend from the IVC to the aortic arch. Indexed left lower paratracheal node measures 2.8 cm, series 7, image 41. Small AP window and left hilar nodes are subcentimeter but nonspecific. There is a 14 mm right hilar node, series 7, image 58. Difficult to delineate low right infrahilar nodes versus central pulmonary nodules. No esophageal wall thickening. No thyroid nodule. Lungs/Pleura: Advanced emphysema and central bronchial thickening. Left upper lobe of thin walled cavitary lesion with irregular margins measures 3.5 x 2.2 x 1.9 cm, unchanged from recent exam allowing for differences in caliper placement. Innumerable bilateral pulmonary nodules consistent with metastatic disease. Index nodule measures 17 mm, series 8, image 49, unchanged allowing for differences in caliper placement. Increasing ground-glass and linear opacities in both lower lobes favor hypoventilatory change and atelectasis. No pneumothorax. Mild right lower lobe pleural thickening. No significant pleural effusion. Upper Abdomen: Pneumoperitoneum is likely related to recent laparoscopy. Known hepatic lesion in the left lobe is not significantly changed. No upper abdominal free fluid. Musculoskeletal: No focal bone lesion or acute osseous abnormality. Review of the MIP images confirms the above findings. IMPRESSION: 1. No pulmonary embolus. 2. Moderate pericardial effusion, increased from prior exam. No definite pericardial enhancement. 3.  Increasing ground-glass and linear opacities in both lower lobes favor hypoventilatory change and atelectasis. 4. Unchanged left upper lobe cavitary lesion in this short interim CT. Unchanged innumerable bilateral pulmonary nodules typical of metastatic disease. 5. Multifocal mediastinal adenopathy. Right hilar adenopathy. 6. Pneumoperitoneum is likely related to recent laparoscopy. Aortic Atherosclerosis (ICD10-I70.0) and Emphysema (ICD10-J43.9). Electronically Signed   By: Keith Rake M.D.   On: 02/24/2020 16:42   MR BRAIN W WO CONTRAST  Result Date: 02/21/2020 CLINICAL DATA:  Headache, chronic, no new features; non-small cell lung cancer, staging. EXAM: MRI HEAD WITHOUT AND WITH CONTRAST TECHNIQUE: Multiplanar, multiecho pulse sequences of the brain and surrounding structures were obtained without and with intravenous contrast. CONTRAST:  20mL GADAVIST GADOBUTROL 1 MMOL/ML IV SOLN COMPARISON:  No pertinent prior exams available for comparison. FINDINGS: Brain: Mild intermittent motion degradation. Mild cerebral and cerebellar atrophy. No abnormal intracranial enhancement is identified to suggest intracranial metastatic disease. Mild multifocal T2/FLAIR hyperintensity within the cerebral white  matter is nonspecific, but compatible with chronic small vessel ischemic disease. An apparent punctate focus of T2/FLAIR hyperintensity within the left medulla is not appreciated on the axial or coronal T2 weighted sequences and is likely artifactual (series 7, image 3). Punctate focus of SWI signal loss within the right frontal lobe compatible with nonspecific chronic microhemorrhage. There is no acute infarct. No evidence of intracranial mass. No extra-axial fluid collection. No midline shift. Vascular: Expected proximal arterial flow voids. Skull and upper cervical spine: No focal marrow lesion. Sinuses/Orbits: Visualized orbits show no acute finding. Mild ethmoid sinus mucosal thickening. 1.5 cm left maxillary  sinus mucous retention cyst. IMPRESSION: Mildly motion degraded examination. No intracranial metastatic disease is identified. No evidence of acute intracranial abnormality. Mild generalized atrophy of the brain and chronic small vessel ischemic disease. Nonspecific chronic microhemorrhage within the right frontal lobe. Mild ethmoid sinus mucosal thickening. 1.5 cm left maxillary sinus mucous retention cyst. Electronically Signed   By: Kellie Simmering DO   On: 02/21/2020 18:59   NM Pulmonary Perf and Vent  Result Date: 02/24/2020 CLINICAL DATA:  Chest pain. Shortness of breath. Bladder cancer. COPD. Headache. COVID-19 positive. EXAM: NUCLEAR MEDICINE PERFUSION LUNG SCAN TECHNIQUE: Perfusion images were obtained in multiple projections after intravenous injection of radiopharmaceutical. Ventilation scans intentionally deferred if perfusion scan and chest x-ray adequate for interpretation during COVID 19 epidemic. RADIOPHARMACEUTICALS:  4.3 mCi Tc-20m MAA IV COMPARISON:  Chest radiographs dated 02/22/2020. Chest CT dated 02/18/2020. FINDINGS: Multiple subsegmental perfusion defects in the right lung in the and in the left upper lobe. Extensive bilateral bullous changes on the recent CT. IMPRESSION: COPD with associated multiple bilateral perfusion defects, greater on the right. This is indeterminate for the possibility of pulmonary embolism. Electronically Signed   By: Claudie Revering M.D.   On: 02/24/2020 13:01   DG CHEST PORT 1 VIEW  Result Date: 02/22/2020 CLINICAL DATA:  Recent COVID infection with persistent shortness of breath EXAM: PORTABLE CHEST 1 VIEW COMPARISON:  02/21/2020 FINDINGS: Cardiac shadow is within normal limits. Lungs are well aerated bilaterally. Persistent but improved airspace opacity is noted consistent with the given clinical history. Lucency is noted beneath the right hemidiaphragm new from the previous exams suspicious for free intraperitoneal air. IMPRESSION: Persistent but improved  aeration bilaterally consistent with the known history of COVID-19 positivity. Findings suggestive of free intraperitoneal air. CT of the abdomen and pelvis is recommended for further evaluation. Electronically Signed   By: Inez Catalina M.D.   On: 02/22/2020 11:22   DG CHEST PORT 1 VIEW  Result Date: 02/21/2020 CLINICAL DATA:  History of COVID-19 positivity, recent bronchoscopy EXAM: PORTABLE CHEST 1 VIEW COMPARISON:  02/18/2020 FINDINGS: Cardiac shadow is within normal limits. The lungs are hyperinflated. Cavitary lesion in the left upper lobe laterally is again seen. Known nodules are less well appreciated on this exam. No pneumothorax is noted following bronchoscopy. IMPRESSION: No evidence of post bronchoscopy pneumothorax. Electronically Signed   By: Inez Catalina M.D.   On: 02/21/2020 15:59   DG C-ARM BRONCHOSCOPY  Result Date: 02/21/2020 C-ARM BRONCHOSCOPY: Fluoroscopy was utilized by the requesting physician.  No radiographic interpretation.     Assessment and Plan:   Pericardial effusion - ?moderate - noted on CTA  - echocardiogram confirms moderate pericardial effusion, no tamponade physiology, not circumferential, and possibly chronic  - he is hemodynamically stable - recommend repeat echo in 1 week to monitor effusion - no indication for pericardiocentesis at this time   Chest pain - pleuritic,  positional, and reproducible on palpation - low suspicion for ACS process, although he does have risk factors - suspect pericarditis, will start colchicine 0.6 mg daily for planned 3 month course.   Hypertension - no home medications - on amlodipine 10 mg, 25 mg hydralazine BID   COPD Pneumoperitoneum  Cavitary lung mass Multiple pulmonary nodules - per primary - oncology and pulmonary on board - he will follow up with his oncologist at [place]:      For questions or updates, please contact [place] Please consult [email or web address] for  contact info under    Signed, Ledora Bottcher, [place]  02/25/2020 8:43 AM   Patient seen and examined. Agree with above documentation. Mr. Mengel is a 65 year old male with a history of hypertension, bladder cancer status post chemotherapy and right nephrectomy, COPD, former tobacco use who we are consulted to see by Dr. Tyrell Antonio for evaluation of pericardial effusion. Mr. Foushee was diagnosed with COVID-19 infection on 02/05/2020. Since that time he has been having nausea/vomiting and chest pain.  Describes chest pain as constant right-sided pain, worse when he takes a deep breath or lies flat.  Improves with leaning forward.  He was then admitted here on 02/18/2020. CT chest showed cavitary mass and numerous pulmonary nodules. Suspected non-small cell lung cancer. CT abdomen showed lesions in liver and spine. Also found to have pneumoperitoneum on CT abdomen and was taken to the OR on 02/23/2020 for laparoscopic evaluation, in which no source of perforation was found. Repeat CT chest 1/29 showed moderate pericardial effusion, larger than previous. Echocardiogram today shows LVEF 70 to 75%, normal RV function, no significant valvular disease, moderate pericardial effusion anterior to RV measuring 1.1 cm, no evidence of tamponade.  On exam, patient is alert and oriented, regular rate and rhythm, no murmurs, lungs CTAB, no LE edema or JVD.  EKG shows NSR, rate 85, PACs.  Most recent vital signs show BP 135/87, pulse 86, SPO2 95% on room air.  Telemetry personally reviewed and shows sinus rhythm with rate 70s to 110s.  For his pericardial effusion, echo shows moderate effusion anterior to RV.  No evidence of tamponade.  Hemodynamically stable.  We will plan repeat echocardiogram in 1 week to follow.  His chest pain is pleuritic and positional, as he reports it is worse with lying flat.  Concerning for pericarditis, though no rub on exam or EKG changes.  However given his description of the chest pain and his  pericardial effusion, will treat for pericarditis with colchicine 0.6 mg daily x3 months.  Will schedule outpatient cardiology follow-up.  Donato Heinz, MD

## 2020-02-25 NOTE — Progress Notes (Signed)
  Echocardiogram 2D Echocardiogram has been performed.  Geoffery Lyons Swaim 02/25/2020, 9:12 AM

## 2020-02-25 NOTE — Discharge Summary (Signed)
Physician Discharge Summary  RAJESH WYSS VOJ:[id number] DOB: 05-23-1955 DOA: 02/18/2020  PCP: Arman Bogus., MD  Admit date: 02/18/2020 Discharge date: 02/25/2020  Admitted From: Home  Disposition: Home   Recommendations for Outpatient Follow-up:  1. Follow up with PCP in 1-2 weeks 2. Please obtain BMP/CBC in one week 3. Need ECHO in 1 week to follow up pericardial effusion.  4. Needs Follow up with PCP to monitor renal function.  5. Needs follow up with oncologist to start treatment for lung cancer. And follow up results of liver mass.    Home Health: none  Discharge Condition: Stable.  CODE STATUS: Full code Diet recommendation: Heart Healthy   Brief/Interim Summary: 65 year old with past medical history significant for COPD, GERD, bladder cancer status post BCG who presented to [facility] due to increased work of breathing, chest pain that has been going on since his diagnosis of Covid on 1/10.  Patient had nausea and vomiting initially with the  diagnosis of Covid.  Subsequently that resolved.  He reports 10 pounds weight loss since his symptoms are started.  With worsening increased work of breathing.  Evaluation in the ED creatinine of 2.0, lactic acid 1.2.  Chest x-ray was suggestive of developing cavitary lesion in the left apex.  CT chest without contrast showed left upper lobe opacity which corresponds to a cavitary mass within left upper lobe possible representing postinflammatory cavitary lesion or a cavitary neoplasm.  AFB sputum ordered.  ID and pulmonologist has been consulted.  Patient develops chest pain 1/27, chest x ray showed free air abdomen. CT abdomen pelvis showed moderate Pneumoperitoneum of indeterminate etiology, concern for bowel perforation. Colonic diverticulosis, pulmonary and hepatic metastasis diseases. He was evaluated by surgery, his abdomen was benign, plan was to repeat CT scan with oral contrast. CT with contrast showed  No source of  perforation, large volume of free air increase from prior CT scan, diffuse wall thickening of the stomach, multiple BL pulmonary nodule and 2 cm mass left hepatic lobe.   Patient and family discussed case with surgery, decision was made to proceed with diagnostic  laparoscopy abdomen 1/28. No source of perforation found.    1-Cavitary lung mass; Multiples Pulmonary nodule; cytology favor to represent Non small Cell cancer. insufficient sample.  Probably related to Malignancy. ID consulted, recommend to stop antibiotics, AFB sputum and pulmonary consultation.  Recent covid infection 10 days ago. PCR in hospital negative. Off isolation.  Follow: Cryptococcal antigen negative , histoplasma antigen, Legionella antigen,coccidiodes, Blastomyces antigen negative, acid-fast smear times 2 negative.  Underwent Bronchoscopy 1/26. Cytology: Lymph Node; malignant cell, malignant cell are favor to represent non small cell carcinoma on smear. There is no definitive cellular evidence of lymph node in the submitted material;/  Cytology Brushing; atypical cells,  Oncology consulted.  cytology showed malignant cells, insufficient sample, favor to represent non small cell lung cancer. Dr Chryl Heck recommend repeat sample for molecular testing and PDL1. She discussed noncurative intent of treatment with patient.  -Liver Biopsy might provide additional information.  I have called his oncologist as well, they will call patient with appointment.   2-COPD; continue with inhaler.  Started on prednisone taper dose.  Report dyspnea on exertion. Repeated chest x ray improvement of lung aeration.  I have schedule Albuterol/ipratropium.  Continue with advair BID.   Continue with prednisone taper over 2 weeks.  Oxygen sat stable on RA, and on ambulation.    Pneumoperitoneum:  -Free air abdomen per chest x ray. -CT abdomen  showed no source for Pneumoperitoneum. Worsening free air on CT scan 1/28 -Underwent laparoscopy  1/28, no source for perforation.  -Tolerating diet. Ok to discontinue Zosyn.    3-AKI: Improved with IV fluids Cr peak to 2.0. Cr down to 1.4--1.5---1.4--1.3 Stable.  Needs close follow up.   4-History of bladder cancer, S.P CBG.   5-Hypomagnesemia; replaced.  6-Anemia;  Folic acid low. Started  supplement.  Iron and B 12 normal.   7-HTN;  Continue with norvasc and oral hydralazine. Marland Kitchen PRN hydralazine.  Better controlled on current regimen.   8-Headache; MRI; negative for metastasis. Microhemorrhage. Neurology consulted.  Plan to controlled BP, if persist could consider CT to evaluate vein system.  Headaches improving.   Mild hyponatremia. Resolved.   Chest pain, right side, radiate right arm; Pericardial effusion, Possible pericarditis.  Chest x ray negative for pneumothorax.  He relates he has had chest pain radiates to arm for 2 weeks, is worse with deep breathing.  VQ scan was indeterminate for PE. Initially proceed it  with VQ scan to avoid give him contrast due to AKI.  He is at risk for PE due to cancer, recent covid, and report pleuritic chest pain. CT angio negative for PE>  CT chest; negative for PE, did showed moderate pericardial effusion. ECHO showed moderate pericardial effusion, no evidence of tamponade.  Cardiology recommend ECHO in 1 week and plan to start colchicine for 3 months.    Discharge Diagnoses:  Principal Problem:   Cavitary lesion of lung Active Problems:   COVID-19 virus infection   AKI (acute kidney injury) (HCC)   COPD (chronic obstructive pulmonary disease) (HCC)   History of bladder cancer   Lung nodule   Mediastinal lymphadenopathy   S/P bronchoscopy    Discharge Instructions  Discharge Instructions    Diet - low sodium heart healthy   Complete by: As directed    Discharge wound care:   Complete by: As directed    Increase activity slowly   Complete by: As directed      Allergies as of 02/25/2020      Reactions    Penicillins    Unknown, pt was a child      Medication List    TAKE these medications   acetaminophen 325 MG tablet Commonly known as: TYLENOL Take 2 tablets (650 mg total) by mouth every 6 (six) hours as needed for mild pain, fever or headache.   albuterol 108 (90 Base) MCG/ACT inhaler Commonly known as: VENTOLIN HFA Inhale 2 puffs into the lungs daily as needed for wheezing or shortness of breath.   amLODipine 10 MG tablet Commonly known as: NORVASC Take 1 tablet (10 mg total) by mouth daily. Start taking on: February 26, 2020   bisacodyl 5 MG EC tablet Commonly known as: DULCOLAX Take 1 tablet (5 mg total) by mouth daily as needed for moderate constipation.   colchicine 0.6 MG tablet Take 1 tablet (0.6 mg total) by mouth daily.   escitalopram 20 MG tablet Commonly known as: LEXAPRO Take 20 mg by mouth daily.   Fluticasone-Salmeterol 100-50 MCG/DOSE Aepb Commonly known as: ADVAIR Inhale 1 puff into the lungs in the morning and at bedtime.   folic acid 1 MG tablet Commonly known as: FOLVITE Take 1 tablet (1 mg total) by mouth daily. Start taking on: February 26, 2020   hydrALAZINE 25 MG tablet Commonly known as: APRESOLINE Take 1 tablet (25 mg total) by mouth 2 (two) times daily.   melatonin 5 MG  Tabs Take 1 tablet (5 mg total) by mouth at bedtime.   nicotine 14 mg/24hr patch Commonly known as: NICODERM CQ - dosed in mg/24 hours Place 1 patch (14 mg total) onto the skin daily. Start taking on: February 26, 2020   omeprazole 20 MG tablet Commonly known as: PRILOSEC OTC Take 20 mg by mouth daily.   polyethylene glycol 17 g packet Commonly known as: MIRALAX / GLYCOLAX Take 17 g by mouth 2 (two) times daily.   predniSONE 10 MG tablet Commonly known as: DELTASONE Take prednisone 40 mg daily for 6 days then take 30 mg for 5 days the 20 mg for 3 days then stop.   tamsulosin 0.4 MG Caps capsule Commonly known as: FLOMAX Take 0.4 mg by mouth daily.   traMADol  50 MG tablet Commonly known as: Ultram Take 1 tablet (50 mg total) by mouth every 6 (six) hours as needed for up to 5 days.            Discharge Care Instructions  (From admission, onward)         Start     Ordered   02/25/20 0000  Discharge wound care:        02/25/20 1249          Follow-up Information    [facility] Follow up in 1 week(s).   Specialty: Cardiology Why: office will call to arrange for echocardiogram Contact information: [address] [place]       Donato Heinz, MD Follow up in 2 week(s).   Specialties: Cardiology, Radiology Why: office will call with follow up appt Contact information: [address] [place] [address] [phone]        Tod Persia, MD Follow up in 1 week(s).   Specialty: Hematology Why: Office will call you with appointment. if you dont hear from them by tuesday please call.  Contact information: [place] [address] [phone]        Clovis Riley, MD Follow up in 2 week(s).   Specialty: General Surgery Contact information: [address] [place] [address] [phone]              Allergies  Allergen Reactions  . Penicillins     Unknown, pt was a child    Consultations:  Neurology   Cardiology  Pulmonologist   ID  Surgery    Procedures/Studies: CT ABDOMEN PELVIS WO CONTRAST  Result Date: 02/23/2020 CLINICAL DATA:  Concern for perforation. EXAM: CT ABDOMEN AND PELVIS WITHOUT CONTRAST TECHNIQUE: Multidetector CT imaging of the abdomen and pelvis was performed following the standard protocol without IV contrast. COMPARISON:  February 22, 2020 FINDINGS: Lower chest: Multiple bilateral pulmonary nodules are noted.The heart size is normal. Hepatobiliary: There is a 2 cm mass within the left hepatic lobe. Normal gallbladder.There  is no biliary ductal dilation. Pancreas: Normal contours without ductal dilatation. No peripancreatic fluid collection. Spleen: Unremarkable. Adrenals/Urinary Tract: --Adrenal glands: Unremarkable. --Right kidney/ureter: The right kidney is absent. --Left kidney/ureter: No hydronephrosis or radiopaque kidney stones. --Urinary bladder: Unremarkable. Stomach/Bowel: --Stomach/Duodenum: A there is diffuse wall thickening of the stomach. --Small bowel: Unremarkable. --Colon: There is a large amount of stool throughout the colon. There are scattered colonic diverticula without CT evidence for diverticulitis. --Appendix: Normal. Vascular/Lymphatic: Atherosclerotic calcification is present within the non-aneurysmal abdominal aorta, without hemodynamically significant stenosis. --there are no pathologically enlarged retroperitoneal  lymph nodes. --No mesenteric lymphadenopathy. --No pelvic or inguinal lymphadenopathy. Reproductive: Unremarkable Other: There is a large volume of free air, increased from prior study. No significant free fluid. No extraluminal oral contrast. Musculoskeletal. There is a sclerotic lesion in the L3 vertebral body. There are end-stage degenerative changes of the left hip. IMPRESSION: 1. Large volume of free air, increased from prior study. No extraluminal oral contrast. While the exact site of perforation is not visualized, the stomach is favored to be the source. 2. Diffuse wall thickening of the stomach, concerning for gastritis. 3. Multiple bilateral pulmonary nodules, concerning for metastatic disease. 4. Indeterminate 2 cm mass within the left hepatic lobe, also concerning for metastatic disease. 5. Sclerotic lesion in the L3 vertebral body, concerning for osseous metastatic disease. 6. End-stage degenerative changes of the left hip. 7. Large amount of stool throughout the colon. Aortic Atherosclerosis (ICD10-I70.0). Electronically Signed   By: Constance Holster M.D.   On: 02/23/2020 00:44    CT ABDOMEN PELVIS WO CONTRAST  Result Date: 02/22/2020 CLINICAL DATA:  65 year old male with concern for peritonitis or perforation. EXAM: CT ABDOMEN AND PELVIS WITHOUT CONTRAST TECHNIQUE: Multidetector CT imaging of the abdomen and pelvis was performed following the standard protocol without IV contrast. COMPARISON:  Chest radiograph dated 02/22/2020. FINDINGS: Evaluation of this exam is limited in the absence of intravenous contrast. Lower chest: There is background of emphysema. Right lung base nodular and streaky densities may represent atelectasis but concerning for infiltrate. Several scattered bilateral pulmonary nodules measure up to 11 mm also seen on the prior CT in keeping with metastatic disease. There is moderate pneumoperitoneum. No free fluid. Hepatobiliary: There is a 17 mm hypodense lesion in the left lobe of the liver which is not characterized on this CT, but most consistent with metastatic disease. No intrahepatic biliary dilatation. The gallbladder is unremarkable. Pancreas: Unremarkable. No pancreatic ductal dilatation or surrounding inflammatory changes. Spleen: Normal in size without focal abnormality. Adrenals/Urinary Tract: The adrenal glands unremarkable. There is a solitary left kidney. There is no hydronephrosis or nephrolithiasis of the left kidney. The left ureter and urinary bladder appear unremarkable. Stomach/Bowel: There is moderate stool throughout the colon. There is sigmoid diverticulosis and scattered colonic diverticula without active inflammatory changes. There is no bowel obstruction or active inflammation. The appendix is normal. Vascular/Lymphatic: Mild aortoiliac atherosclerotic disease. The IVC is unremarkable. No portal venous gas. There is no adenopathy. Reproductive: The prostate and seminal vesicles are grossly unremarkable. No pelvic mass. Other: Small containing bilateral inguinal hernias as well as a small fat containing umbilical hernia. Musculoskeletal: A  15 mm sclerotic lesion of L3. Severe degenerative changes of the left hip. No acute osseous pathology. Left L5 pars defect. IMPRESSION: 1. Moderate pneumoperitoneum of indeterminate etiology but concerning for bowel perforation. Clinical correlation and surgical consult is advised. 2. Colonic diverticulosis. No bowel obstruction. Normal appendix. 3. Solitary left kidney. No hydronephrosis or nephrolithiasis. 4. Pulmonary and hepatic metastatic disease. 5. A 15 mm sclerotic lesion of L3. 6. Aortic Atherosclerosis (ICD10-I70.0) and Emphysema (ICD10-J43.9). These results were called by telephone at the time of interpretation on 02/22/2020 at 7:06 pm to Amo, who verbally acknowledged these results. Electronically Signed   By: Anner Crete M.D.   On: 02/22/2020 19:09   DG Chest 2 View  Result Date: 02/24/2020 CLINICAL DATA:  Shortness of breath with exertion. Chest pain. History of COPD and cavitary lung lesion. History of COVID-19. Status post liver biopsy and laparoscopy on 02/23/2020 and video bronchoscopy  on 02/21/2020. EXAM: CHEST - 2 VIEW COMPARISON:  02/22/2020. Abdomen and pelvis CT dated 02/23/2020. Chest CT dated 02/18/2020. FINDINGS: Again demonstrated is free peritoneal air. Normal sized heart. No pneumothorax or pneumomediastinum. Stable multiple small ill-defined left lung densities and left upper lobe cavitary lesion. Multiple small bilateral lung nodules are better visualized on the previous CT. Unremarkable bones. IMPRESSION: 1. Stable free peritoneal air. 2. Stable left upper lobe cavitary lesion and multiple small bilateral lung nodules. Electronically Signed   By: Claudie Revering M.D.   On: 02/24/2020 11:37   CT CHEST WO CONTRAST  Result Date: 02/18/2020 CLINICAL DATA:  Chest pain, dyspnea, COVID positive EXAM: CT CHEST WITHOUT CONTRAST TECHNIQUE: Multidetector CT imaging of the chest was performed following the standard protocol without IV contrast. COMPARISON:  None. FINDINGS:  Cardiovascular: Mild coronary artery calcification. Global cardiac size within normal limits. Trace pericardial effusion. The central pulmonary arteries are of normal caliber. Mild atherosclerotic calcification within the thoracic aorta. No aortic aneurysm. Mediastinum/Nodes: Pathologic right paratracheal and aortopulmonary lymphadenopathy is present with the index lymph node seen within the right paratracheal region measuring 2.0 x 3.1 cm at axial image # 57. The esophagus is unremarkable. Thyroid unremarkable. Lungs/Pleura: Severe, apically predominant centrilobular emphysema. The irregular opacity noted within the left apex on prior chest radiograph corresponds to a oblong cavitary mass measuring 2.1 x 2.1 x 3.7 cm in greatest dimension on axial image # 30 and coronal image # 64. The lesion demonstrates a a mildly irregular soft tissue rind and a cavitary neoplasm is difficult to exclude. Alternatively, this may represent the residua of a cavitary infectious process. No superimposed focal pulmonary infiltrate is identified. There are scattered throughout the lungs in a random distribution demonstrating a lower lung zone predominance at least 30 pulmonary nodules measuring up to 15 mm within the left upper lobe at axial image # 61. These are suspicious for pulmonary metastases in these may represent metastatic disease from an unknown primary versus is intra pulmonary metastases secondary to a primary pulmonary malignancy. No pneumothorax or pleural effusion. The central airways are widely patent. Upper Abdomen: A surgical staple line is seen inferior to the right adrenal gland which may reflect surgical changes of a right nephrectomy, though this is not definitively identified on this examination. No acute abnormality within the upper abdomen. Musculoskeletal: No lytic or blastic bone lesions are identified. IMPRESSION: Left upper lobe opacity corresponds to a cavitary mass within the left upper lobe possibly  representing a post inflammatory cavity or, less likely, a cavitary neoplasm. Innumerable pulmonary nodules distributed randomly with a lower lobe predominance most in keeping with pulmonary metastatic disease. Pathologic mediastinal adenopathy also identified. Severe centrilobular emphysema. Aortic Atherosclerosis (ICD10-I70.0). Electronically Signed   By: Fidela Salisbury MD   On: 02/18/2020 22:42   CT ANGIO CHEST PE W OR WO CONTRAST  Result Date: 02/24/2020 CLINICAL DATA:  Chest pain or SOB, pleurisy or effusion suspected Worsening shortness of breath and chest pain since COVID diagnosis 02/05/2020. Laparoscopy yesterday. EXAM: CT ANGIOGRAPHY CHEST WITH CONTRAST TECHNIQUE: Multidetector CT imaging of the chest was performed using the standard protocol during bolus administration of intravenous contrast. Multiplanar CT image reconstructions and MIPs were obtained to evaluate the vascular anatomy. CONTRAST:  56m OMNIPAQUE IOHEXOL 350 MG/ML SOLN COMPARISON:  Ventilation perfusion scan earlier today. Chest CT 02/18/2020 FINDINGS: Cardiovascular: There are no filling defects within the pulmonary arteries to suggest pulmonary embolus. Attenuation of upper lobe pulmonary arteries likely due to COPD. Heart is normal  in size. Moderate pericardial effusion measures up to 16 mm in depth anteriorly, increased from prior exam. There is no definite pericardial enhancement. Moderate aortic atherosclerosis without dissection or acute aortic abnormality. Left paratracheal adenopathy causes mild mass effect on the undersurface of the aortic arch. Dense contrast in the brachiocephalic vein obscures the origin of the great vessels. Mediastinum/Nodes: Multifocal mediastinal adenopathy. Conglomerate nodes in the lower paratracheal station, extend from the IVC to the aortic arch. Indexed left lower paratracheal node measures 2.8 cm, series 7, image 41. Small AP window and left hilar nodes are subcentimeter but nonspecific. There is  a 14 mm right hilar node, series 7, image 58. Difficult to delineate low right infrahilar nodes versus central pulmonary nodules. No esophageal wall thickening. No thyroid nodule. Lungs/Pleura: Advanced emphysema and central bronchial thickening. Left upper lobe of thin walled cavitary lesion with irregular margins measures 3.5 x 2.2 x 1.9 cm, unchanged from recent exam allowing for differences in caliper placement. Innumerable bilateral pulmonary nodules consistent with metastatic disease. Index nodule measures 17 mm, series 8, image 49, unchanged allowing for differences in caliper placement. Increasing ground-glass and linear opacities in both lower lobes favor hypoventilatory change and atelectasis. No pneumothorax. Mild right lower lobe pleural thickening. No significant pleural effusion. Upper Abdomen: Pneumoperitoneum is likely related to recent laparoscopy. Known hepatic lesion in the left lobe is not significantly changed. No upper abdominal free fluid. Musculoskeletal: No focal bone lesion or acute osseous abnormality. Review of the MIP images confirms the above findings. IMPRESSION: 1. No pulmonary embolus. 2. Moderate pericardial effusion, increased from prior exam. No definite pericardial enhancement. 3. Increasing ground-glass and linear opacities in both lower lobes favor hypoventilatory change and atelectasis. 4. Unchanged left upper lobe cavitary lesion in this short interim CT. Unchanged innumerable bilateral pulmonary nodules typical of metastatic disease. 5. Multifocal mediastinal adenopathy. Right hilar adenopathy. 6. Pneumoperitoneum is likely related to recent laparoscopy. Aortic Atherosclerosis (ICD10-I70.0) and Emphysema (ICD10-J43.9). Electronically Signed   By: Keith Rake M.D.   On: 02/24/2020 16:42   MR BRAIN W WO CONTRAST  Result Date: 02/21/2020 CLINICAL DATA:  Headache, chronic, no new features; non-small cell lung cancer, staging. EXAM: MRI HEAD WITHOUT AND WITH CONTRAST  TECHNIQUE: Multiplanar, multiecho pulse sequences of the brain and surrounding structures were obtained without and with intravenous contrast. CONTRAST:  34m GADAVIST GADOBUTROL 1 MMOL/ML IV SOLN COMPARISON:  No pertinent prior exams available for comparison. FINDINGS: Brain: Mild intermittent motion degradation. Mild cerebral and cerebellar atrophy. No abnormal intracranial enhancement is identified to suggest intracranial metastatic disease. Mild multifocal T2/FLAIR hyperintensity within the cerebral white matter is nonspecific, but compatible with chronic small vessel ischemic disease. An apparent punctate focus of T2/FLAIR hyperintensity within the left medulla is not appreciated on the axial or coronal T2 weighted sequences and is likely artifactual (series 7, image 3). Punctate focus of SWI signal loss within the right frontal lobe compatible with nonspecific chronic microhemorrhage. There is no acute infarct. No evidence of intracranial mass. No extra-axial fluid collection. No midline shift. Vascular: Expected proximal arterial flow voids. Skull and upper cervical spine: No focal marrow lesion. Sinuses/Orbits: Visualized orbits show no acute finding. Mild ethmoid sinus mucosal thickening. 1.5 cm left maxillary sinus mucous retention cyst. IMPRESSION: Mildly motion degraded examination. No intracranial metastatic disease is identified. No evidence of acute intracranial abnormality. Mild generalized atrophy of the brain and chronic small vessel ischemic disease. Nonspecific chronic microhemorrhage within the right frontal lobe. Mild ethmoid sinus mucosal thickening. 1.5 cm left  maxillary sinus mucous retention cyst. Electronically Signed   By: Kellie Simmering DO   On: 02/21/2020 18:59   NM Pulmonary Perf and Vent  Result Date: 02/24/2020 CLINICAL DATA:  Chest pain. Shortness of breath. Bladder cancer. COPD. Headache. COVID-19 positive. EXAM: NUCLEAR MEDICINE PERFUSION LUNG SCAN TECHNIQUE: Perfusion images  were obtained in multiple projections after intravenous injection of radiopharmaceutical. Ventilation scans intentionally deferred if perfusion scan and chest x-ray adequate for interpretation during COVID 19 epidemic. RADIOPHARMACEUTICALS:  4.3 mCi Tc-55mMAA IV COMPARISON:  Chest radiographs dated 02/22/2020. Chest CT dated 02/18/2020. FINDINGS: Multiple subsegmental perfusion defects in the right lung in the and in the left upper lobe. Extensive bilateral bullous changes on the recent CT. IMPRESSION: COPD with associated multiple bilateral perfusion defects, greater on the right. This is indeterminate for the possibility of pulmonary embolism. Electronically Signed   By: SClaudie ReveringM.D.   On: 02/24/2020 13:01   DG CHEST PORT 1 VIEW  Result Date: 02/22/2020 CLINICAL DATA:  Recent COVID infection with persistent shortness of breath EXAM: PORTABLE CHEST 1 VIEW COMPARISON:  02/21/2020 FINDINGS: Cardiac shadow is within normal limits. Lungs are well aerated bilaterally. Persistent but improved airspace opacity is noted consistent with the given clinical history. Lucency is noted beneath the right hemidiaphragm new from the previous exams suspicious for free intraperitoneal air. IMPRESSION: Persistent but improved aeration bilaterally consistent with the known history of COVID-19 positivity. Findings suggestive of free intraperitoneal air. CT of the abdomen and pelvis is recommended for further evaluation. Electronically Signed   By: MInez CatalinaM.D.   On: 02/22/2020 11:22   DG CHEST PORT 1 VIEW  Result Date: 02/21/2020 CLINICAL DATA:  History of COVID-19 positivity, recent bronchoscopy EXAM: PORTABLE CHEST 1 VIEW COMPARISON:  02/18/2020 FINDINGS: Cardiac shadow is within normal limits. The lungs are hyperinflated. Cavitary lesion in the left upper lobe laterally is again seen. Known nodules are less well appreciated on this exam. No pneumothorax is noted following bronchoscopy. IMPRESSION: No evidence of  post bronchoscopy pneumothorax. Electronically Signed   By: MInez CatalinaM.D.   On: 02/21/2020 15:59   DG Chest Port 1 View  Result Date: 02/18/2020 CLINICAL DATA:  Chest pain and shortness of breath since positive COVID test from 2 weeks ago. EXAM: PORTABLE CHEST 1 VIEW COMPARISON:  07/18/2017 FINDINGS: Heart size and pulmonary vascularity are normal. Emphysematous changes in the lungs. Scattered fibrosis. There is suggestion of a developing cavitary lesion in the left apex measuring 2.4 cm diameter. CT suggested for further evaluation. No pleural effusions. No pneumothorax. Mediastinal contours appear intact. IMPRESSION: 1. Suggestion of developing cavitary lesion in the left apex. CT suggested for further evaluation. 2. Emphysematous changes and fibrosis in the lungs. Electronically Signed   By: WLucienne CapersM.D.   On: 02/18/2020 21:15   ECHOCARDIOGRAM COMPLETE  Result Date: 02/25/2020    ECHOCARDIOGRAM REPORT   Patient Name:   DSTEPHFON BOVEYDate of Exam: 02/25/2020 Medical Rec #:  [id number]    Height:       72.0 in Accession #:    [id number]   Weight:       177.0 lb Date of Birth:  [birth year]    BSA:          2.023 m Patient Age:    62years      BP:           145/76 mmHg Patient Gender: M  HR:           86 bpm. Exam Location:  Inpatient Procedure: 2D Echo, Cardiac Doppler, Color Doppler and Intracardiac            Opacification Agent Indications:    Pericardial effusion 423.9 / I31.3  History:        Patient has no prior history of Echocardiogram examinations.                 COPD; Risk Factors:Former Smoker.  Sonographer:    Vickie Epley RDCS Referring Phys: [id number] [facility] A Nhyla Nappi  Sonographer Comments: Technically difficult study due to poor echo windows. IMPRESSIONS  1. Left ventricular ejection fraction, by estimation, is 70 to 75%. The left ventricle has hyperdynamic function. The left ventricle has no regional wall motion abnormalities. Left ventricular diastolic parameters are  consistent with Grade I diastolic dysfunction (impaired relaxation).  2. Right ventricular systolic function is normal. The right ventricular size is normal.  3. Somewhat chronic appearing with fibrinous material attached to RV free wall. 1.1 cm in diameter. Moderate pericardial effusion. The pericardial effusion is anterior to the right ventricle. There is no evidence of cardiac tamponade.  4. The mitral valve is normal in structure. No evidence of mitral valve regurgitation. No evidence of mitral stenosis.  5. The aortic valve is normal in structure. Aortic valve regurgitation is not visualized. No aortic stenosis is present.  6. The inferior vena cava is normal in size with greater than 50% respiratory variability, suggesting right atrial pressure of 3 mmHg. FINDINGS  Left Ventricle: Left ventricular ejection fraction, by estimation, is 70 to 75%. The left ventricle has hyperdynamic function. The left ventricle has no regional wall motion abnormalities. Definity contrast agent was given IV to delineate the left ventricular endocardial borders. The left ventricular internal cavity size was normal in size. There is no left ventricular hypertrophy. Left ventricular diastolic parameters are consistent with Grade I diastolic dysfunction (impaired relaxation). Right Ventricle: The right ventricular size is normal. No increase in right ventricular wall thickness. Right ventricular systolic function is normal. Left Atrium: Left atrial size was normal in size. Right Atrium: Right atrial size was normal in size. Pericardium: Somewhat chronic appearing with fibrinous material attached to RV free wall. 1.1 cm in diameter. A moderately sized pericardial effusion is present. The pericardial effusion is anterior to the right ventricle. There is no evidence of cardiac  tamponade. Mitral Valve: The mitral valve is normal in structure. No evidence of mitral valve regurgitation. No evidence of mitral valve stenosis. Tricuspid  Valve: The tricuspid valve is normal in structure. Tricuspid valve regurgitation is not demonstrated. No evidence of tricuspid stenosis. Aortic Valve: The aortic valve is normal in structure. Aortic valve regurgitation is not visualized. No aortic stenosis is present. Pulmonic Valve: The pulmonic valve was normal in structure. Pulmonic valve regurgitation is not visualized. No evidence of pulmonic stenosis. Aorta: The aortic root is normal in size and structure. Venous: The inferior vena cava was not well visualized. The inferior vena cava is normal in size with greater than 50% respiratory variability, suggesting right atrial pressure of 3 mmHg. IAS/Shunts: No atrial level shunt detected by color flow Doppler.  LEFT VENTRICLE PLAX 2D LVOT diam:     2.10 cm LV SV:         89 LV SV Index:   44 LVOT Area:     3.46 cm  LV Volumes (MOD) LV vol d, MOD A4C: 65.9 ml LV vol s, MOD  A4C: 19.5 ml LV SV MOD A4C:     65.9 ml RIGHT VENTRICLE TAPSE (M-mode): 1.9 cm LEFT ATRIUM           Index       RIGHT ATRIUM           Index LA Vol (A4C): 23.1 ml 11.42 ml/m RA Area:     13.30 cm                                   RA Volume:   35.40 ml  17.50 ml/m  AORTIC VALVE LVOT Vmax:   142.00 cm/s LVOT Vmean:  91.400 cm/s LVOT VTI:    0.258 m  AORTA Ao Root diam: 3.10 cm  SHUNTS Systemic VTI:  0.26 m Systemic Diam: 2.10 cm Candee Furbish MD Electronically signed by Candee Furbish MD Signature Date/Time: 02/25/2020/10:41:46 AM    Final    DG C-ARM BRONCHOSCOPY  Result Date: 02/21/2020 C-ARM BRONCHOSCOPY: Fluoroscopy was utilized by the requesting physician.  No radiographic interpretation.     Subjective: He report continue improvement of headaches. Feels better.  He report chest pain mildly improved, change with position, pleuritic.  No BM yet, But passing gas.  He said his wife will be on the phone all day, she is working. She is aware of the plan for pericardial effusion, he needs ECHO in 1 week.   Discharge Exam: Vitals:    02/25/20 0812 02/25/20 1018  BP:  135/87  Pulse:  86  Resp:  18  Temp:  98.4 F (36.9 C)  SpO2: 98% 95%     General: Pt is alert, awake, not in acute distress Cardiovascular: RRR, S1/S2 +, no rubs, no gallops Respiratory: CTA bilaterally, no wheezing, no rhonchi Abdominal: Soft, NT, ND, bowel sounds + Extremities: no edema, no cyanosis    The results of significant diagnostics from this hospitalization (including imaging, microbiology, ancillary and laboratory) are listed below for reference.     Microbiology: Recent Results (from the past 240 hour(s))  Blood culture (routine x 2)     Status: None   Collection Time: 02/18/20 10:25 PM   Specimen: BLOOD  Result Value Ref Range Status   Specimen Description BLOOD LEFT ANTECUBITAL  Final   Special Requests   Final    BOTTLES DRAWN AEROBIC AND ANAEROBIC Blood Culture adequate volume   Culture   Final    NO GROWTH 5 DAYS Performed at [facility], [address] [place], [place] [address]    Report Status 02/24/2020 FINAL  Final  Blood culture (routine x 2)     Status: None   Collection Time: 02/18/20 10:30 PM   Specimen: BLOOD  Result Value Ref Range Status   Specimen Description BLOOD BLOOD RIGHT FOREARM  Final   Special Requests   Final    BOTTLES DRAWN AEROBIC AND ANAEROBIC Blood Culture adequate volume   Culture   Final    NO GROWTH 5 DAYS Performed at [facility], [place] [address] [place], [place] [address]    Report Status 02/24/2020 FINAL  Final  SARS CORONAVIRUS 2 (TAT 6-24 HRS)     Status: None   Collection Time: 02/20/20  2:24 AM  Result Value Ref Range Status   SARS Coronavirus 2 NEGATIVE NEGATIVE Final    Comment: (NOTE) SARS-CoV-2 target nucleic acids are NOT DETECTED.  The SARS-CoV-2 RNA is generally detectable in upper and  lower respiratory specimens during the acute phase of infection. Negative results do not preclude SARS-CoV-2 infection, do not rule out co-infections with other  pathogens, and should not be used as the sole basis for treatment or other patient management decisions. Negative results must be combined with clinical observations, patient history, and epidemiological information. The expected result is Negative.  Fact Sheet for Patients: SugarRoll.be  Fact Sheet for Healthcare Providers: https://www.woods-mathews.com/  This test is not yet approved or cleared by the [place] FDA and  has been authorized for detection and/or diagnosis of SARS-CoV-2 by FDA under an Emergency Use Authorization (EUA). This EUA will remain  in effect (meaning this test can be used) for the duration of the COVID-19 declaration under Se ction 564(b)(1) of the Act, 21 U.S.C. section 360bbb-3(b)(1), unless the authorization is terminated or revoked sooner.  Performed at [facility], [place] [address], [place], [place] [address]   Blastomyces Antigen     Status: None   Collection Time: 02/20/20  4:55 PM   Specimen: Blood  Result Value Ref Range Status   Blastomyces Antigen None Detected None Detected ng/mL Final    Comment: (NOTE) Results reported as ng/mL in 0.2 - 14.7 ng/mL range Results above the limit of detection but below 0.2 ng/mL are reported as 'Positive, Below the Limit of Quantification' Results above 14.7 ng/mL are reported as 'Positive, Above the Limit of Quantification'    Specimen Type SERUM  Final    Comment: (NOTE) Performed At: [facility], [place] [id number] Noralyn Pick MD FI:[id number]   MRSA PCR Screening     Status: None   Collection Time: 02/20/20  6:46 PM   Specimen: Nasal Mucosa; Nasopharyngeal  Result Value Ref Range Status   MRSA by PCR NEGATIVE NEGATIVE Final    Comment:        The GeneXpert MRSA Assay (FDA approved for NASAL specimens only), is one component of a comprehensive MRSA colonization surveillance program. It is  not intended to diagnose MRSA infection nor to guide or monitor treatment for MRSA infections. Performed at [facility], [place] [address] [place], [place] [address]   Culture, BAL-quantitative     Status: None   Collection Time: 02/21/20  2:16 PM   Specimen: PATH Cytology washing; Lung  Result Value Ref Range Status   Specimen Description BRONCHIAL WASHINGS  Final   Special Requests LUL SAMPLE A  Final   Gram Stain   Final    FEW WBC PRESENT, PREDOMINANTLY MONONUCLEAR NO ORGANISMS SEEN    Culture   Final    NO GROWTH 2 DAYS Performed at [facility], [place] [address], [place], [place] [address]    Report Status 02/23/2020 FINAL  Final  Acid Fast Smear (AFB)     Status: None   Collection Time: 02/21/20  2:16 PM   Specimen: PATH Cytology washing; Lung  Result Value Ref Range Status   AFB Specimen Processing Concentration  Final   Acid Fast Smear Negative  Final    Comment: (NOTE) Performed At: [facility] [place], [place] [id number] Rush Farmer MD DU:[id number]    Source (AFB) BRONCHIAL WASHINGS  Final    Comment: LUL SAMPLE A Performed at [facility], [place] [address] [place], [place] [address]   Acid Fast Smear (AFB)     Status: None   Collection Time: 02/21/20  2:49 PM   Specimen: PATH Cytology FNA; Node  Result Value Ref Range Status   AFB Specimen Processing Concentration  Final   Acid Fast Smear Negative  Final    Comment: (NOTE) Performed At: [facility] [place], [place] [id number] Rush Farmer MD DX:[id number]    Source (AFB) NEEDLE ASPIRATE SAMPLE E  Final    Comment: Performed at [facility], [place] [address] [place], [place] [address]     Labs: BNP (last 3 results) No results for input(s): BNP in the last 8760 hours. Basic Metabolic Panel: Recent Labs  Lab 02/20/20 0318 02/21/20 0747 02/22/20 0721 02/23/20 0453 02/24/20 0457 02/25/20 1130  NA 135 134* 135 136 134* 135   K 4.4 4.4 4.5 4.3 4.0 3.9  CL 103 102 101 100 100 99  CO2 20* 20* 22 24 21* 25  GLUCOSE 165* 158* 139* 102* 158* 128*  BUN 26* 27* 33* 30* 28* 25*  CREATININE 1.46* 1.43* 1.59* 1.49* 1.39* 1.43*  CALCIUM 9.0 9.2 8.9 9.2 8.7* 8.9  MG 1.5* 1.9 1.9  --   --   --   PHOS 3.3 3.4 2.9  --   --   --    Liver Function Tests: Recent Labs  Lab 02/20/20 0318 02/21/20 0747 02/22/20 0721 02/23/20 0453 02/24/20 0457  AST _0 ALT 25 28 44 36 43  ALKPHOS 75 76 79 76 75  BILITOT 0.3 0.8 0.5 0.9 0.6  PROT 6.6 7.1 6.5 6.6 6.3*  ALBUMIN 3.2* 3.4* 3.2* 3.3* 3.4*   No results for input(s): LIPASE, AMYLASE in the last 168 hours. No results for input(s): AMMONIA in the last 168 hours. CBC: Recent Labs  Lab 02/18/20 2125 02/20/20 0318 02/21/20 0747 02/22/20 0721 02/23/20 0453 02/24/20 0457  WBC 9.5 13.4* 14.2* 14.9* 13.7* 13.1*  NEUTROABS 7.8*  --  13.0* 12.8* 11.0* 11.0*  HGB 12.0* 11.5* 12.5* 12.8* 13.1 12.9*  HCT 35.4* 33.2* 38.4* 39.0 39.6 38.3*  MCV 84.1 82.8 84.0 83.7 84.3 84.9  PLT 304 298 361 336 343 274   Cardiac Enzymes: No results for input(s): CKTOTAL, CKMB, CKMBINDEX, TROPONINI in the last 168 hours. BNP: Invalid input(s): POCBNP CBG: No results for input(s): GLUCAP in the last 168 hours. D-Dimer No results for input(s): DDIMER in the last 72 hours. Hgb A1c No results for input(s): HGBA1C in the last 72 hours. Lipid Profile No results for input(s): CHOL, HDL, LDLCALC, TRIG, CHOLHDL, LDLDIRECT in the last 72 hours. Thyroid function studies No results for input(s): TSH, T4TOTAL, T3FREE, THYROIDAB in the last 72 hours.  Invalid input(s): FREET3 Anemia work up No results for input(s): VITAMINB12, FOLATE, FERRITIN, TIBC, IRON, RETICCTPCT in the last 72 hours. Urinalysis No results found for: COLORURINE, APPEARANCEUR, [place], [place], [place], [place], [place], [place], PROTEINUR, UROBILINOGEN, NITRITE, LEUKOCYTESUR Sepsis Labs Invalid input(s):  PROCALCITONIN,  WBC,  LACTICIDVEN Microbiology Recent Results (from the past 240 hour(s))  Blood culture (routine x 2)     Status: None   Collection Time: 02/18/20 10:25 PM   Specimen: BLOOD  Result Value Ref Range Status   Specimen Description BLOOD LEFT ANTECUBITAL  Final   Special Requests   Final    BOTTLES DRAWN AEROBIC AND ANAEROBIC Blood Culture adequate volume   Culture   Final    NO GROWTH 5 DAYS Performed at [facility], [address] [place], [place] [address]    Report Status 02/24/2020 FINAL  Final  Blood culture (routine x 2)     Status: None  Collection Time: 02/18/20 10:30 PM   Specimen: BLOOD  Result Value Ref Range Status   Specimen Description BLOOD BLOOD RIGHT FOREARM  Final   Special Requests   Final    BOTTLES DRAWN AEROBIC AND ANAEROBIC Blood Culture adequate volume   Culture   Final    NO GROWTH 5 DAYS Performed at [facility], [address], [place], [place] [address]    Report Status 02/24/2020 FINAL  Final  SARS CORONAVIRUS 2 (TAT 6-24 HRS)     Status: None   Collection Time: 02/20/20  2:24 AM  Result Value Ref Range Status   SARS Coronavirus 2 NEGATIVE NEGATIVE Final    Comment: (NOTE) SARS-CoV-2 target nucleic acids are NOT DETECTED.  The SARS-CoV-2 RNA is generally detectable in upper and lower respiratory specimens during the acute phase of infection. Negative results do not preclude SARS-CoV-2 infection, do not rule out co-infections with other pathogens, and should not be used as the sole basis for treatment or other patient management decisions. Negative results must be combined with clinical observations, patient history, and epidemiological information. The expected result is Negative.  Fact Sheet for Patients: SugarRoll.be  Fact Sheet for Healthcare Providers: https://www.woods-mathews.com/  This test is not yet approved or cleared by the [place] FDA and  has been  authorized for detection and/or diagnosis of SARS-CoV-2 by FDA under an Emergency Use Authorization (EUA). This EUA will remain  in effect (meaning this test can be used) for the duration of the COVID-19 declaration under Se ction 564(b)(1) of the Act, 21 U.S.C. section 360bbb-3(b)(1), unless the authorization is terminated or revoked sooner.  Performed at [facility], [place] [address], [place], [place] [address]   Blastomyces Antigen     Status: None   Collection Time: 02/20/20  4:55 PM   Specimen: Blood  Result Value Ref Range Status   Blastomyces Antigen None Detected None Detected ng/mL Final    Comment: (NOTE) Results reported as ng/mL in 0.2 - 14.7 ng/mL range Results above the limit of detection but below 0.2 ng/mL are reported as 'Positive, Below the Limit of Quantification' Results above 14.7 ng/mL are reported as 'Positive, Above the Limit of Quantification'    Specimen Type SERUM  Final    Comment: (NOTE) Performed At: [facility] [place], [place] [id number] Noralyn Pick MD RF:[id number]   MRSA PCR Screening     Status: None   Collection Time: 02/20/20  6:46 PM   Specimen: Nasal Mucosa; Nasopharyngeal  Result Value Ref Range Status   MRSA by PCR NEGATIVE NEGATIVE Final    Comment:        The GeneXpert MRSA Assay (FDA approved for NASAL specimens only), is one component of a comprehensive MRSA colonization surveillance program. It is not intended to diagnose MRSA infection nor to guide or monitor treatment for MRSA infections. Performed at [facility], [place] [address] [place], [place] [address]   Culture, BAL-quantitative     Status: None   Collection Time: 02/21/20  2:16 PM   Specimen: PATH Cytology washing; Lung  Result Value Ref Range Status   Specimen Description BRONCHIAL WASHINGS  Final   Special Requests LUL SAMPLE A  Final   Gram Stain   Final    FEW WBC PRESENT, PREDOMINANTLY MONONUCLEAR NO  ORGANISMS SEEN    Culture   Final    NO GROWTH 2 DAYS Performed at [facility], [place] [address]  [place], [place] [address]    Report Status 02/23/2020 FINAL  Final  Acid Fast Smear (AFB)     Status: None   Collection Time: 02/21/20  2:16 PM   Specimen: PATH Cytology washing; Lung  Result Value Ref Range Status   AFB Specimen Processing Concentration  Final   Acid Fast Smear Negative  Final    Comment: (NOTE) Performed At: [facility] [place], [place] [id number] Rush Farmer MD MC:[id number]    Source (AFB) BRONCHIAL WASHINGS  Final    Comment: LUL SAMPLE A Performed at [facility], [place] [address] [place], [place] [address]   Acid Fast Smear (AFB)     Status: None   Collection Time: 02/21/20  2:49 PM   Specimen: PATH Cytology FNA; Node  Result Value Ref Range Status   AFB Specimen Processing Concentration  Final   Acid Fast Smear Negative  Final    Comment: (NOTE) Performed At: [facility] [place], [place] [id number] Rush Farmer MD BP:[id number]    Source (AFB) NEEDLE ASPIRATE SAMPLE E  Final    Comment: Performed at [facility], [place] [address], [place], [place] [address]     Time coordinating discharge: 40 minutes  SIGNED:   Elmarie Shiley, MD  [facility]

## 2020-02-25 NOTE — Discharge Instructions (Signed)
Please follow up with Dr Theda Sers Oncologist for further treatment options. Also for results of pathology of liver mass.  You have two new medications for Blood pressure, Norvasc and Hydralazine.  If your headaches doesn't resolved, you might need further evaluation.  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  You need [place] of your heart in 1 week, and follow up with cardiologist. See appointment.  You need lab work to follow up on kidney function with your PCP within 1 week.  Your folic acid was low, take 1 mg folic acid daily.

## 2020-02-26 ENCOUNTER — Other Ambulatory Visit: Payer: Self-pay | Admitting: Physician Assistant

## 2020-02-26 ENCOUNTER — Telehealth: Payer: Self-pay | Admitting: *Deleted

## 2020-02-26 DIAGNOSIS — I3139 Other pericardial effusion (noninflammatory): Secondary | ICD-10-CM

## 2020-02-26 DIAGNOSIS — I313 Pericardial effusion (noninflammatory): Secondary | ICD-10-CM

## 2020-02-26 NOTE — Telephone Encounter (Signed)
Order placed for limited echo and sent to that department to help with scheduling.

## 2020-02-26 NOTE — Telephone Encounter (Signed)
-----   Message from Nuala Alpha, LPN sent at 9/49/9718  7:43 AM EST -----  ----- Message ----- From: Ledora Bottcher, PA Sent: 02/25/2020  12:05 PM EST To: [facility] [place] Triage  This patient is new to Dr. Gardiner Rhyme.   He needs to have a limited echo in 1 week and follow up with Dr. Gardiner Rhyme in 2 weeks.   Thanks Angie

## 2020-02-28 ENCOUNTER — Other Ambulatory Visit: Payer: Self-pay

## 2020-02-28 DIAGNOSIS — R911 Solitary pulmonary nodule: Secondary | ICD-10-CM

## 2020-02-28 LAB — SURGICAL PATHOLOGY

## 2020-03-01 ENCOUNTER — Telehealth: Payer: Self-pay | Admitting: Pulmonary Disease

## 2020-03-01 NOTE — Telephone Encounter (Signed)
FYI - RA put in order on 2/2 for pt to have PET.  I scheduled pt for 2/14 at [facility].  Spoke to pt today to give him appt info and he states oncologist (Dr Alverda Skeans) at [facility] has him scheduled for one there on 2/8.  I verified with him it is a PET scan.   He wants to make sure RA can pull up results in our system.  He also wants to let RA know he was very happy with his visit with him and appreciates his care.  He can make sure RA gets results of scan if he can't access it.  I will cancel PET scheduled at [facility].

## 2020-03-01 NOTE — Telephone Encounter (Signed)
FYI for RA.  Sent to him.

## 2020-03-01 NOTE — Telephone Encounter (Signed)
Echo 03/04/20

## 2020-03-04 ENCOUNTER — Ambulatory Visit (HOSPITAL_COMMUNITY)
Admission: RE | Admit: 2020-03-04 | Discharge: 2020-03-04 | Disposition: A | Discharge status: Home / Self Care | Payer: Medicare Other | Source: Ambulatory Visit | Attending: Cardiology | Admitting: Cardiology

## 2020-03-04 ENCOUNTER — Other Ambulatory Visit: Payer: Self-pay

## 2020-03-04 DIAGNOSIS — I313 Pericardial effusion (noninflammatory): Secondary | ICD-10-CM | POA: Diagnosis not present

## 2020-03-04 DIAGNOSIS — J449 Chronic obstructive pulmonary disease, unspecified: Secondary | ICD-10-CM | POA: Insufficient documentation

## 2020-03-04 DIAGNOSIS — I3139 Other pericardial effusion (noninflammatory): Secondary | ICD-10-CM

## 2020-03-04 NOTE — Progress Notes (Signed)
  Echocardiogram 2D Echocardiogram has been performed.  Jennette Dubin 03/04/2020, 11:42 AM

## 2020-03-06 NOTE — Telephone Encounter (Signed)
Pl cancel our order for PET scan

## 2020-03-11 ENCOUNTER — Other Ambulatory Visit (HOSPITAL_COMMUNITY): Payer: Medicare Other

## 2020-03-13 ENCOUNTER — Encounter: Payer: Self-pay | Admitting: Cardiology

## 2020-03-13 ENCOUNTER — Telehealth (INDEPENDENT_AMBULATORY_CARE_PROVIDER_SITE_OTHER): Payer: Medicare Other | Admitting: Cardiology

## 2020-03-13 VITALS — BP 108/68 | HR 96 | Ht 72.0 in | Wt 172.0 lb

## 2020-03-13 DIAGNOSIS — I313 Pericardial effusion (noninflammatory): Secondary | ICD-10-CM | POA: Insufficient documentation

## 2020-03-13 DIAGNOSIS — I3139 Other pericardial effusion (noninflammatory): Secondary | ICD-10-CM

## 2020-03-13 DIAGNOSIS — J432 Centrilobular emphysema: Secondary | ICD-10-CM

## 2020-03-13 DIAGNOSIS — Z8551 Personal history of malignant neoplasm of bladder: Secondary | ICD-10-CM

## 2020-03-13 DIAGNOSIS — U071 COVID-19: Secondary | ICD-10-CM

## 2020-03-13 DIAGNOSIS — N179 Acute kidney failure, unspecified: Secondary | ICD-10-CM

## 2020-03-13 NOTE — Progress Notes (Signed)
Pt reported he was increasingly SOB, especially when laying flat, and said he was going to [employer] PA-C 03/13/2020 3:02 PM

## 2020-03-22 LAB — FUNGUS CULTURE WITH STAIN

## 2020-03-22 LAB — FUNGUS CULTURE RESULT

## 2020-03-22 LAB — FUNGAL ORGANISM REFLEX

## 2020-04-04 ENCOUNTER — Encounter (HOSPITAL_BASED_OUTPATIENT_CLINIC_OR_DEPARTMENT_OTHER): Payer: Self-pay | Admitting: Emergency Medicine

## 2020-04-04 ENCOUNTER — Emergency Department (HOSPITAL_BASED_OUTPATIENT_CLINIC_OR_DEPARTMENT_OTHER): Payer: Medicare Other

## 2020-04-04 ENCOUNTER — Inpatient Hospital Stay (HOSPITAL_BASED_OUTPATIENT_CLINIC_OR_DEPARTMENT_OTHER)
Admission: EM | Admit: 2020-04-04 | Discharge: 2020-04-08 | DRG: 640 | Disposition: A | Discharge status: Hospice | Payer: Medicare Other | Attending: Internal Medicine | Admitting: Internal Medicine

## 2020-04-04 ENCOUNTER — Other Ambulatory Visit: Payer: Self-pay

## 2020-04-04 DIAGNOSIS — N179 Acute kidney failure, unspecified: Secondary | ICD-10-CM | POA: Diagnosis present

## 2020-04-04 DIAGNOSIS — J439 Emphysema, unspecified: Secondary | ICD-10-CM | POA: Diagnosis present

## 2020-04-04 DIAGNOSIS — Z8616 Personal history of COVID-19: Secondary | ICD-10-CM | POA: Diagnosis not present

## 2020-04-04 DIAGNOSIS — C689 Malignant neoplasm of urinary organ, unspecified: Secondary | ICD-10-CM

## 2020-04-04 DIAGNOSIS — R590 Localized enlarged lymph nodes: Secondary | ICD-10-CM | POA: Diagnosis present

## 2020-04-04 DIAGNOSIS — R0602 Shortness of breath: Secondary | ICD-10-CM | POA: Diagnosis not present

## 2020-04-04 DIAGNOSIS — Z7189 Other specified counseling: Secondary | ICD-10-CM | POA: Diagnosis not present

## 2020-04-04 DIAGNOSIS — Z789 Other specified health status: Secondary | ICD-10-CM | POA: Diagnosis not present

## 2020-04-04 DIAGNOSIS — I5033 Acute on chronic diastolic (congestive) heart failure: Secondary | ICD-10-CM | POA: Diagnosis not present

## 2020-04-04 DIAGNOSIS — C7801 Secondary malignant neoplasm of right lung: Secondary | ICD-10-CM | POA: Diagnosis present

## 2020-04-04 DIAGNOSIS — E871 Hypo-osmolality and hyponatremia: Secondary | ICD-10-CM

## 2020-04-04 DIAGNOSIS — R1310 Dysphagia, unspecified: Secondary | ICD-10-CM | POA: Diagnosis not present

## 2020-04-04 DIAGNOSIS — Z66 Do not resuscitate: Secondary | ICD-10-CM | POA: Diagnosis present

## 2020-04-04 DIAGNOSIS — Z515 Encounter for palliative care: Secondary | ICD-10-CM | POA: Diagnosis not present

## 2020-04-04 DIAGNOSIS — C688 Malignant neoplasm of overlapping sites of urinary organs: Secondary | ICD-10-CM | POA: Diagnosis present

## 2020-04-04 DIAGNOSIS — Z8701 Personal history of pneumonia (recurrent): Secondary | ICD-10-CM

## 2020-04-04 DIAGNOSIS — C787 Secondary malignant neoplasm of liver and intrahepatic bile duct: Secondary | ICD-10-CM | POA: Diagnosis present

## 2020-04-04 DIAGNOSIS — C7802 Secondary malignant neoplasm of left lung: Secondary | ICD-10-CM | POA: Diagnosis present

## 2020-04-04 DIAGNOSIS — F32A Depression, unspecified: Secondary | ICD-10-CM | POA: Diagnosis present

## 2020-04-04 DIAGNOSIS — Z9079 Acquired absence of other genital organ(s): Secondary | ICD-10-CM | POA: Diagnosis not present

## 2020-04-04 DIAGNOSIS — Z880 Allergy status to penicillin: Secondary | ICD-10-CM

## 2020-04-04 DIAGNOSIS — E441 Mild protein-calorie malnutrition: Secondary | ICD-10-CM | POA: Diagnosis present

## 2020-04-04 DIAGNOSIS — I3139 Other pericardial effusion (noninflammatory): Secondary | ICD-10-CM | POA: Diagnosis present

## 2020-04-04 DIAGNOSIS — I509 Heart failure, unspecified: Secondary | ICD-10-CM | POA: Diagnosis present

## 2020-04-04 DIAGNOSIS — Z20822 Contact with and (suspected) exposure to covid-19: Secondary | ICD-10-CM | POA: Diagnosis present

## 2020-04-04 DIAGNOSIS — J449 Chronic obstructive pulmonary disease, unspecified: Secondary | ICD-10-CM | POA: Diagnosis present

## 2020-04-04 DIAGNOSIS — E860 Dehydration: Principal | ICD-10-CM | POA: Diagnosis present

## 2020-04-04 DIAGNOSIS — Z6821 Body mass index (BMI) 21.0-21.9, adult: Secondary | ICD-10-CM

## 2020-04-04 DIAGNOSIS — Z7951 Long term (current) use of inhaled steroids: Secondary | ICD-10-CM

## 2020-04-04 DIAGNOSIS — Z79899 Other long term (current) drug therapy: Secondary | ICD-10-CM

## 2020-04-04 DIAGNOSIS — Z905 Acquired absence of kidney: Secondary | ICD-10-CM

## 2020-04-04 DIAGNOSIS — I313 Pericardial effusion (noninflammatory): Secondary | ICD-10-CM | POA: Diagnosis present

## 2020-04-04 DIAGNOSIS — E861 Hypovolemia: Secondary | ICD-10-CM | POA: Diagnosis present

## 2020-04-04 DIAGNOSIS — C7951 Secondary malignant neoplasm of bone: Secondary | ICD-10-CM | POA: Diagnosis present

## 2020-04-04 DIAGNOSIS — Z95820 Peripheral vascular angioplasty status with implants and grafts: Secondary | ICD-10-CM | POA: Diagnosis not present

## 2020-04-04 DIAGNOSIS — M8458XA Pathological fracture in neoplastic disease, other specified site, initial encounter for fracture: Secondary | ICD-10-CM | POA: Diagnosis present

## 2020-04-04 DIAGNOSIS — J9601 Acute respiratory failure with hypoxia: Secondary | ICD-10-CM | POA: Diagnosis not present

## 2020-04-04 DIAGNOSIS — Z87891 Personal history of nicotine dependence: Secondary | ICD-10-CM

## 2020-04-04 DIAGNOSIS — N1831 Chronic kidney disease, stage 3a: Secondary | ICD-10-CM | POA: Diagnosis present

## 2020-04-04 DIAGNOSIS — J432 Centrilobular emphysema: Secondary | ICD-10-CM | POA: Diagnosis not present

## 2020-04-04 DIAGNOSIS — D630 Anemia in neoplastic disease: Secondary | ICD-10-CM | POA: Diagnosis present

## 2020-04-04 DIAGNOSIS — R0902 Hypoxemia: Secondary | ICD-10-CM

## 2020-04-04 LAB — CBC
HCT: 32.6 % — ABNORMAL LOW (ref 39.0–52.0)
Hemoglobin: 11.1 g/dL — ABNORMAL LOW (ref 13.0–17.0)
MCH: 28 pg (ref 26.0–34.0)
MCHC: 34 g/dL (ref 30.0–36.0)
MCV: 82.3 fL (ref 80.0–100.0)
Platelets: 245 10*3/uL (ref 150–400)
RBC: 3.96 MIL/uL — ABNORMAL LOW (ref 4.22–5.81)
RDW: 13.1 % (ref 11.5–15.5)
WBC: 12.5 10*3/uL — ABNORMAL HIGH (ref 4.0–10.5)
nRBC: 0 % (ref 0.0–0.2)

## 2020-04-04 LAB — CBC WITH DIFFERENTIAL/PLATELET
Abs Immature Granulocytes: 0.16 10*3/uL — ABNORMAL HIGH (ref 0.00–0.07)
Basophils Absolute: 0 10*3/uL (ref 0.0–0.1)
Basophils Relative: 0 %
Eosinophils Absolute: 0.1 10*3/uL (ref 0.0–0.5)
Eosinophils Relative: 1 %
HCT: 33.1 % — ABNORMAL LOW (ref 39.0–52.0)
Hemoglobin: 11.3 g/dL — ABNORMAL LOW (ref 13.0–17.0)
Immature Granulocytes: 1 %
Lymphocytes Relative: 4 %
Lymphs Abs: 0.5 10*3/uL — ABNORMAL LOW (ref 0.7–4.0)
MCH: 28 pg (ref 26.0–34.0)
MCHC: 34.1 g/dL (ref 30.0–36.0)
MCV: 82.1 fL (ref 80.0–100.0)
Monocytes Absolute: 1.2 10*3/uL — ABNORMAL HIGH (ref 0.1–1.0)
Monocytes Relative: 9 %
Neutro Abs: 10.9 10*3/uL — ABNORMAL HIGH (ref 1.7–7.7)
Neutrophils Relative %: 85 %
Platelets: 253 10*3/uL (ref 150–400)
RBC: 4.03 MIL/uL — ABNORMAL LOW (ref 4.22–5.81)
RDW: 13.1 % (ref 11.5–15.5)
WBC: 12.8 10*3/uL — ABNORMAL HIGH (ref 4.0–10.5)
nRBC: 0 % (ref 0.0–0.2)

## 2020-04-04 LAB — BASIC METABOLIC PANEL
Anion gap: 13 (ref 5–15)
BUN: 32 mg/dL — ABNORMAL HIGH (ref 8–23)
CO2: 24 mmol/L (ref 22–32)
Calcium: 9 mg/dL (ref 8.9–10.3)
Chloride: 87 mmol/L — ABNORMAL LOW (ref 98–111)
Creatinine, Ser: 1.38 mg/dL — ABNORMAL HIGH (ref 0.61–1.24)
GFR, Estimated: 56 mL/min — ABNORMAL LOW (ref >60)
Glucose, Bld: 144 mg/dL — ABNORMAL HIGH (ref 70–99)
Potassium: 4.9 mmol/L (ref 3.5–5.1)
Sodium: 124 mmol/L — ABNORMAL LOW (ref 135–145)

## 2020-04-04 LAB — RESP PANEL BY RT-PCR (FLU A&B, COVID) ARPGX2
Influenza A by PCR: NEGATIVE
Influenza B by PCR: NEGATIVE
SARS Coronavirus 2 by RT PCR: NEGATIVE

## 2020-04-04 LAB — TROPONIN I (HIGH SENSITIVITY)
Troponin I (High Sensitivity): 15 ng/L (ref <18)
Troponin I (High Sensitivity): 15 ng/L (ref <18)

## 2020-04-04 LAB — CREATININE, SERUM
Creatinine, Ser: 1.57 mg/dL — ABNORMAL HIGH (ref 0.61–1.24)
GFR, Estimated: 48 mL/min — ABNORMAL LOW (ref >60)

## 2020-04-04 LAB — BRAIN NATRIURETIC PEPTIDE: B Natriuretic Peptide: 615.3 pg/mL — ABNORMAL HIGH (ref 0.0–100.0)

## 2020-04-04 MED ORDER — ONDANSETRON HCL 4 MG/2ML IJ SOLN: 4.0000 mg | Freq: Four times a day (QID) | INTRAMUSCULAR | Status: DC | PRN

## 2020-04-04 MED ORDER — MORPHINE SULFATE (PF) 2 MG/ML IV SOLN
2.0000 mg | INTRAVENOUS | Status: DC | PRN
Start: 2020-04-04 — End: 2020-04-06
  Administered 2020-04-04 – 2020-04-06 (×8): 2 mg via INTRAVENOUS
  Filled 2020-04-04 (×8): qty 1

## 2020-04-04 MED ORDER — ACETAMINOPHEN 325 MG PO TABS
650.0000 mg | ORAL_TABLET | ORAL | Status: DC | PRN
  Administered 2020-04-05: 650 mg via ORAL
  Filled 2020-04-04: qty 2

## 2020-04-04 MED ORDER — MORPHINE SULFATE (PF) 4 MG/ML IV SOLN
4.0000 mg | Freq: Once | INTRAVENOUS | Status: AC
Start: 2020-04-04 — End: 2020-04-04
  Administered 2020-04-04: 4 mg via INTRAVENOUS
  Filled 2020-04-04: qty 1

## 2020-04-04 MED ORDER — MORPHINE SULFATE (PF) 4 MG/ML IV SOLN
4.0000 mg | Freq: Once | INTRAVENOUS | Status: AC
  Administered 2020-04-04: 4 mg via INTRAVENOUS
  Filled 2020-04-04: qty 1

## 2020-04-04 MED ORDER — HEPARIN SODIUM (PORCINE) 5000 UNIT/ML IJ SOLN
5000.0000 [IU] | Freq: Three times a day (TID) | INTRAMUSCULAR | Status: DC
  Administered 2020-04-04 – 2020-04-05 (×2): 5000 [IU] via SUBCUTANEOUS
  Filled 2020-04-04 (×2): qty 1

## 2020-04-04 MED ORDER — SODIUM CHLORIDE 0.9% FLUSH
3.0000 mL | Freq: Two times a day (BID) | INTRAVENOUS | Status: DC
  Administered 2020-04-04 – 2020-04-08 (×6): 3 mL via INTRAVENOUS

## 2020-04-04 MED ORDER — ONDANSETRON HCL 4 MG/2ML IJ SOLN
4.0000 mg | Freq: Once | INTRAMUSCULAR | Status: AC
  Administered 2020-04-04: 4 mg via INTRAVENOUS
  Filled 2020-04-04: qty 2

## 2020-04-04 MED ORDER — FUROSEMIDE 10 MG/ML IJ SOLN
40.0000 mg | Freq: Once | INTRAMUSCULAR | Status: AC
  Administered 2020-04-04: 40 mg via INTRAVENOUS
  Filled 2020-04-04: qty 4

## 2020-04-04 MED ORDER — FUROSEMIDE 10 MG/ML IJ SOLN: 40.0000 mg | Freq: Two times a day (BID) | INTRAMUSCULAR | Status: DC

## 2020-04-04 MED ORDER — SODIUM CHLORIDE 0.9 % IV SOLN: 250.0000 mL | INTRAVENOUS | Status: DC | PRN

## 2020-04-04 MED ORDER — SODIUM CHLORIDE 0.9% FLUSH: 3.0000 mL | INTRAVENOUS | Status: DC | PRN

## 2020-04-04 NOTE — ED Notes (Signed)
Report given to Ras[place], receiving RN at [facility].

## 2020-04-04 NOTE — ED Notes (Signed)
ED Provider at bedside. 

## 2020-04-04 NOTE — ED Notes (Signed)
Patient transported to X-ray 

## 2020-04-04 NOTE — ED Notes (Signed)
Pt in xray cannot get vitals

## 2020-04-04 NOTE — H&P (Signed)
History and Physical   Corey Dixon GLO:[id number] DOB: 01-23-1956 DOA: 04/04/2020  Referring MD/NP/PA: Davonna Belling, MD  PCP: Arman Bogus., MD   Patient coming from: [place]  Chief Complaint: Shortness of breath  HPI: Corey Dixon is a 64 y.o. male with medical history significant of Chronic stable pericardial effusion, bladder cancer, COPD, who was recently admitted to the hospital with pericarditis and pneumonia presenting now with progressive shortness of breath weakness and cough.  He has had more exertional dyspnea.  He was doing PT yesterday and as noted more weakness.  He has not been back to his baseline.  No fever or chills but complains of right lower lung feels pain more pleuritic in nature.  Denied any new exposure to COVID-19 but patient had Covid couple months back.  He had a bedside limited echo done in the ER showed his pericarditis still present but no tamponade.  Patient is on colchicine for that.  He was sent over here for evaluation and treatment therefore.  He denied any cough no hemoptysis..  .  ED Course: Temperature 98.1 blood pressure 94/76 pulse 103 respiratory 20 oxygen sat 92% on room air.  Creatinine 1.57 white count is 12.8 BNP 650 sodium however 124 chloride 87.  Repeat COVID-19 is negative.  Chest x-ray shows no edema or consolidation.  X-ray of the left shoulder shows no acute findings.  Patient being admitted with shortness of breath suspected cardiac in nature with exertional dyspnea.  Review of Systems: As per HPI otherwise 10 point review of systems negative.    Past Medical History:  Diagnosis Date  . Bladder cancer ([place])   . COPD (chronic obstructive pulmonary disease) ([place])     Past Surgical History:  Procedure Laterality Date  . BRONCHIAL BRUSHINGS  02/21/2020   Procedure: BRONCHIAL BRUSHINGS;  Surgeon: Rigoberto Noel, MD;  Location: [facility]COPY;  Service: Cardiopulmonary;;  . BRONCHIAL NEEDLE ASPIRATION BIOPSY  02/21/2020    Procedure: BRONCHIAL NEEDLE ASPIRATION BIOPSIES;  Surgeon: Rigoberto Noel, MD;  Location: [place];  Service: Cardiopulmonary;;  . BRONCHIAL WASHINGS  02/21/2020   Procedure: BRONCHIAL WASHINGS;  Surgeon: Rigoberto Noel, MD;  Location: [place];  Service: Cardiopulmonary;;  . ENDOBRONCHIAL ULTRASOUND N/A 02/21/2020   Procedure: ENDOBRONCHIAL ULTRASOUND;  Surgeon: Rigoberto Noel, MD;  Location: [place];  Service: Cardiopulmonary;  Laterality: N/A;  . LAPAROSCOPY  02/23/2020   Procedure: LAPAROSCOPY DIAGNOSTIC;  Surgeon: Clovis Riley, MD;  Location: [place];  Service: General;;  . LIVER BIOPSY  02/23/2020   Procedure: LIVER BIOPSY;  Surgeon: Clovis Riley, MD;  Location: [place];  Service: General;;  . RENAL ARTERY STENT  2009, 09/15/2016  . VIDEO BRONCHOSCOPY N/A 02/21/2020   Procedure: VIDEO BRONCHOSCOPY WITH FLUORO;  Surgeon: Rigoberto Noel, MD;  Location: [place];  Service: Cardiopulmonary;  Laterality: N/A;     reports that he has quit smoking. His smoking use included e-cigarettes. He has never used smokeless tobacco. He reports current alcohol use. He reports that he does not use drugs.  Allergies  Allergen Reactions  . Penicillins     Unknown, pt was a child    No family history on file.   Prior to Admission medications   Medication Sig Start Date End Date Taking? Authorizing Provider  acetaminophen (TYLENOL) 325 MG tablet Take 2 tablets (650 mg total) by mouth every 6 (six) hours as needed for mild pain, fever or headache. 02/25/20   Regalado, [employer]  A, MD  albuterol (VENTOLIN HFA) 108 (90 Base) MCG/ACT inhaler Inhale 2 puffs into the lungs daily as needed for wheezing or shortness of breath. 11/11/15   [provider]  escitalopram (LEXAPRO) 20 MG tablet Take 20 mg by mouth daily. 02/03/20   [provider]  Fluticasone-Salmeterol (ADVAIR) 100-50 MCG/DOSE AEPB Inhale 1 puff into the lungs in the morning and at bedtime. 03/01/15   [provider]  folic acid (FOLVITE) 1 MG tablet Take 1 tablet (1 mg total) by mouth daily. 02/26/20   Regalado, Belkys A, MD  melatonin 5 MG TABS Take 1 tablet (5 mg total) by mouth at bedtime. 02/25/20   Regalado, Belkys A, MD  nicotine (NICODERM CQ - DOSED IN MG/24 HOURS) 14 mg/24hr patch Place 1 patch (14 mg total) onto the skin daily. 02/26/20   Regalado, Belkys A, MD  omeprazole (PRILOSEC OTC) 20 MG tablet Take 20 mg by mouth daily.    [provider]  polyethylene glycol (MIRALAX / GLYCOLAX) 17 g packet Take 17 g by mouth 2 (two) times daily. 02/25/20   Regalado, Belkys A, MD  tamsulosin (FLOMAX) 0.4 MG CAPS capsule Take 0.4 mg by mouth daily. 07/10/15   [provider]    Physical Exam: Vitals:   04/04/20 1834 04/04/20 1836 04/04/20 1933 04/05/20 0007  BP: 103/81  106/87 94/76  Pulse: 98  97 99  Resp: 20  14 19   Temp: (!) 97.5 F (36.4 C)  97.8 F (36.6 C) 98 F (36.7 C)  TempSrc: Oral   Oral  SpO2: 95%  96% 92%  Weight:  71.4 kg    Height:  6' (1.829 m)        Constitutional: Acutely ill looking in mild distress Vitals:   04/04/20 1834 04/04/20 1836 04/04/20 1933 04/05/20 0007  BP: 103/81  106/87 94/76  Pulse: 98  97 99  Resp: 20  14 19   Temp: (!) 97.5 F (36.4 C)  97.8 F (36.6 C) 98 F (36.7 C)  TempSrc: Oral   Oral  SpO2: 95%  96% 92%  Weight:  71.4 kg    Height:  6' (1.829 m)     Eyes: PERRL, lids and conjunctivae normal ENMT: Mucous membranes are moist. Posterior pharynx clear of any exudate or lesions.Normal dentition.  Neck: normal, supple, no masses, no thyromegaly Respiratory: clear to auscultation bilaterally, no wheezing, no crackles. Normal respiratory effort. No accessory muscle use.  Cardiovascular: Sinus tachycardia, no murmurs / rubs / gallops. No extremity edema. 2+ pedal pulses. No carotid bruits.  Abdomen: no tenderness, no masses palpated. No hepatosplenomegaly. Bowel sounds positive.  Musculoskeletal: no clubbing / cyanosis.  No joint deformity upper and lower extremities. Good ROM, no contractures. Normal muscle tone.  Skin: no rashes, lesions, ulcers. No induration Neurologic: CN 2-12 grossly intact. Sensation intact, DTR normal. Strength 5/5 in all 4.  Psychiatric: Normal judgment and insight. Alert and oriented x 3. Normal mood.     Labs on Admission: I have personally reviewed following labs and imaging studies  CBC: Recent Labs  Lab 04/04/20 1100 04/04/20 1922  WBC 12.8* 12.5*  NEUTROABS 10.9*  --   HGB 11.3* 11.1*  HCT 33.1* 32.6*  MCV 82.1 82.3  PLT 253 627   Basic Metabolic Panel: Recent Labs  Lab 04/04/20 1100 04/04/20 1922  NA 124*  --   K 4.9  --   CL 87*  --   CO2 24  --   GLUCOSE 144*  --  BUN 32*  --   CREATININE 1.38* 1.57*  CALCIUM 9.0  --    GFR: Estimated Creatinine Clearance: 46.7 mL/min (A) (by C-G formula based on SCr of 1.57 mg/dL (H)). Liver Function Tests: No results for input(s): AST, ALT, ALKPHOS, BILITOT, PROT, ALBUMIN in the last 168 hours. No results for input(s): LIPASE, AMYLASE in the last 168 hours. No results for input(s): AMMONIA in the last 168 hours. Coagulation Profile: No results for input(s): INR, PROTIME in the last 168 hours. Cardiac Enzymes: No results for input(s): CKTOTAL, CKMB, CKMBINDEX, TROPONINI in the last 168 hours. BNP (last 3 results) No results for input(s): PROBNP in the last 8760 hours. HbA1C: No results for input(s): HGBA1C in the last 72 hours. CBG: No results for input(s): GLUCAP in the last 168 hours. Lipid Profile: No results for input(s): CHOL, HDL, LDLCALC, TRIG, CHOLHDL, LDLDIRECT in the last 72 hours. Thyroid Function Tests: No results for input(s): TSH, T4TOTAL, FREET4, T3FREE, THYROIDAB in the last 72 hours. Anemia Panel: No results for input(s): VITAMINB12, FOLATE, FERRITIN, TIBC, IRON, RETICCTPCT in the last 72 hours. Urine analysis: No results found for: COLORURINE, APPEARANCEUR, LABSPEC, PHURINE, GLUCOSEU,  HGBUR, BILIRUBINUR, KETONESUR, PROTEINUR, UROBILINOGEN, NITRITE, LEUKOCYTESUR Sepsis Labs: @LABRCNTIP (procalcitonin:4,lacticidven:4) ) Recent Results (from the past 240 hour(s))  Resp Panel by RT-PCR (Flu A&B, Covid) Nasopharyngeal Swab     Status: None   Collection Time: 04/04/20  2:34 PM   Specimen: Nasopharyngeal Swab; Nasopharyngeal(NP) swabs in vial transport medium  Result Value Ref Range Status   SARS Coronavirus 2 by RT PCR NEGATIVE NEGATIVE Final    Comment: (NOTE) SARS-CoV-2 target nucleic acids are NOT DETECTED.  The SARS-CoV-2 RNA is generally detectable in upper respiratory specimens during the acute phase of infection. The lowest concentration of SARS-CoV-2 viral copies this assay can detect is 138 copies/mL. A negative result does not preclude SARS-Cov-2 infection and should not be used as the sole basis for treatment or other patient management decisions. A negative result may occur with  improper specimen collection/handling, submission of specimen other than nasopharyngeal swab, presence of viral mutation(s) within the areas targeted by this assay, and inadequate number of viral copies(<138 copies/mL). A negative result must be combined with clinical observations, patient history, and epidemiological information. The expected result is Negative.  Fact Sheet for Patients:  EntrepreneurPulse.com.au  Fact Sheet for Healthcare Providers:  IncredibleEmployment.be  This test is no t yet approved or cleared by the [place] FDA and  has been authorized for detection and/or diagnosis of SARS-CoV-2 by FDA under an Emergency Use Authorization (EUA). This EUA will remain  in effect (meaning this test can be used) for the duration of the COVID-19 declaration under Section 564(b)(1) of the Act, 21 U.S.C.section 360bbb-3(b)(1), unless the authorization is terminated  or revoked sooner.       Influenza A by PCR NEGATIVE NEGATIVE  Final   Influenza B by PCR NEGATIVE NEGATIVE Final    Comment: (NOTE) The Xpert Xpress SARS-CoV-2/FLU/RSV plus assay is intended as an aid in the diagnosis of influenza from Nasopharyngeal swab specimens and should not be used as a sole basis for treatment. Nasal washings and aspirates are unacceptable for Xpert Xpress SARS-CoV-2/FLU/RSV testing.  Fact Sheet for Patients: EntrepreneurPulse.com.au  Fact Sheet for Healthcare Providers: IncredibleEmployment.be  This test is not yet approved or cleared by the [place] FDA and has been authorized for detection and/or diagnosis of SARS-CoV-2 by FDA under an Emergency Use Authorization (EUA). This EUA will remain in effect (meaning  this test can be used) for the duration of the COVID-19 declaration under Section 564(b)(1) of the Act, 21 U.S.C. section 360bbb-3(b)(1), unless the authorization is terminated or revoked.  Performed at [facility], [address] [place], [place] [address]      Radiological Exams on Admission: DG Chest 2 View  Result Date: 04/04/2020 CLINICAL DATA:  Shortness of breath.  Recent fall EXAM: CHEST - 2 VIEW COMPARISON:  February 23, 2017 chest radiograph and chest CT FINDINGS: There is underlying emphysematous change. There is scarring in the left upper lobe region with apparent cavitary lesion again noted in the left upper lobe region measuring 2.0 x 1.7 cm. Multiple nodular appearing opacities are noted bilaterally. No consolidation. Heart size is normal. Pulmonary vascularity is stable, reflecting underlying emphysematous change. No adenopathy is appreciable by radiography. No bone lesions. No pneumothorax. IMPRESSION: Underlying emphysematous change. Scarring left upper lobe with stable cavitary lesion left upper lobe. Nodular opacities bilaterally likely represent metastatic foci. No edema or consolidation. Heart size normal. No pneumothorax. Electronically  Signed   By: Lowella Grip III M.D.   On: 04/04/2020 12:22   DG Shoulder Right  Result Date: 04/04/2020 CLINICAL DATA:  Pain following fall EXAM: RIGHT SHOULDER - 2+ VIEW COMPARISON:  None. FINDINGS: Frontal, oblique, and Y scapular images were obtained. No fracture or dislocation. Joint spaces appear normal. No erosive change. Visualized right lung clear. IMPRESSION: No fracture or dislocation.  No evident arthropathy. Electronically Signed   By: Lowella Grip III M.D.   On: 04/04/2020 12:20    EKG: Independently reviewed. Sinus tachycardia with a rate of 104.  No significant ST changes  Assessment/Plan Principal Problem:   CHF (congestive heart failure) ([facility]) Active Problems:   COPD (chronic obstructive pulmonary disease) ([facility])   History of bladder cancer   Pericardial effusion     #1 chest pain with shortness of breath: Suspected CHF but x-ray is clear.  Patient's pain is pleuritic chest pain.  We will admit the patient.  Initiated on the CHF pathway even though pericardial effusion is stable no tamponade.  I will get a CT angiogram of the chest to rule out PE.  #2 COPD: No acute exacerbation.  #3 pericardial effusion: None pericardial effusion.  Continue colchicine.  No evidence of tamponade at this point.  #4 history of cancer: Stable.  Follow with oncology.  Reported bladder but also long distance.   DVT prophylaxis: Lovenox Code Status: Full code Family Communication: No family at bedside Disposition Plan: Home Consults called: None but cardiology may be consulted if needed Admission status: Inpatient  Severity of Illness: The appropriate patient status for this patient is INPATIENT. Inpatient status is judged to be reasonable and necessary in order to provide the required intensity of service to ensure the patient's safety. The patient's presenting symptoms, physical exam findings, and initial radiographic and laboratory data in the context of their chronic  comorbidities is felt to place them at high risk for further clinical deterioration. Furthermore, it is not anticipated that the patient will be medically stable for discharge from the hospital within 2 midnights of admission. The following factors support the patient status of inpatient.   " The patient's presenting symptoms include shortness of breath and pleuritic chest pain. " The worrisome physical exam findings include sinus tachycardia. " The initial radiographic and laboratory data are worrisome because of no evidence of fluid overload. 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   * I  certify that at the point of admission it is my clinical judgment that the patient will require inpatient hospital care spanning beyond 2 midnights from the point of admission due to high intensity of service, high risk for further deterioration and high frequency of surveillance required.Barbette Merino MD [facility] Pager [phone]  If 7PM-7AM, please contact night-coverage [email or web address] Password [email or web address]  04/05/2020, 12:23 AM

## 2020-04-04 NOTE — ED Provider Notes (Signed)
[facility] EMERGENCY DEPARTMENT Provider Note   CSN: [id number] Arrival date & time: 04/04/20  0946     History Chief Complaint  Patient presents with  . Shortness of Breath  . Weakness    Corey Dixon is a 65 y.o. male.  65 year old male presents with complaint of progressively worsening shortness of breath. Patient has a complex medical history, metastatic lung cancer, recently admitted to the hospital last month for pericarditis and pneumonia. Patient started PT yesterday, reports doing well although is very weak post hospitalization, wife at bedside reports normal vitals/O2 sat on room air throughout session. PT advised patient looked dehydrated (loss of skin turgor) and recommended hydration overnight, present to ER in the morning if not improving. Patient denies any new cough, fevers, vomiting, changes in bowel or bladder habits. States he does have chest pain but feels this is same as the pain he has been having post pericarditis, is compliant with colchicine and prednisone. Reports right shoulder pain, states he had a fall a few days ago and injured his shoulder, no other injuries. States when he sits up he gets a sharp pain in his right mid back        Past Medical History:  Diagnosis Date  . Bladder cancer ([place])   . COPD (chronic obstructive pulmonary disease) [facility])     Patient Active Problem List   Diagnosis Date Noted  . CHF (congestive heart failure) ([place]) 04/04/2020  . Pericardial effusion 03/13/2020  . Mediastinal lymphadenopathy   . S/P bronchoscopy   . COVID-19 virus infection 02/20/2020  . AKI (acute kidney injury) ([place]) 02/20/2020  . COPD (chronic obstructive pulmonary disease) ([place]) 02/20/2020  . History of bladder cancer 02/20/2020  . Lung nodule 02/20/2020  . Cavitary lesion of lung 02/18/2020    Past Surgical History:  Procedure Laterality Date  . BRONCHIAL BRUSHINGS  02/21/2020   Procedure: BRONCHIAL BRUSHINGS;  Surgeon: Rigoberto Noel, MD;  Location: [facility] ENDOSCOPY;  Service: Cardiopulmonary;;  . BRONCHIAL NEEDLE ASPIRATION BIOPSY  02/21/2020   Procedure: BRONCHIAL NEEDLE ASPIRATION BIOPSIES;  Surgeon: Rigoberto Noel, MD;  Location: [place];  Service: Cardiopulmonary;;  . BRONCHIAL WASHINGS  02/21/2020   Procedure: BRONCHIAL WASHINGS;  Surgeon: Rigoberto Noel, MD;  Location: [place];  Service: Cardiopulmonary;;  . ENDOBRONCHIAL ULTRASOUND N/A 02/21/2020   Procedure: ENDOBRONCHIAL ULTRASOUND;  Surgeon: Rigoberto Noel, MD;  Location: [place];  Service: Cardiopulmonary;  Laterality: N/A;  . LAPAROSCOPY  02/23/2020   Procedure: LAPAROSCOPY DIAGNOSTIC;  Surgeon: Clovis Riley, MD;  Location: [place];  Service: General;;  . LIVER BIOPSY  02/23/2020   Procedure: LIVER BIOPSY;  Surgeon: Clovis Riley, MD;  Location: [place];  Service: General;;  . RENAL ARTERY STENT  2009, 09/15/2016  . VIDEO BRONCHOSCOPY N/A 02/21/2020   Procedure: VIDEO BRONCHOSCOPY WITH FLUORO;  Surgeon: Rigoberto Noel, MD;  Location: [place];  Service: Cardiopulmonary;  Laterality: N/A;       No family history on file.  Social History   Tobacco Use  . Smoking status: Former Smoker    Types: E-cigarettes  . Smokeless tobacco: Never Used  Substance Use Topics  . Alcohol use: Yes  . Drug use: Never    Home Medications Prior to Admission medications   Medication Sig Start Date End Date Taking? Authorizing Provider  acetaminophen (TYLENOL) 325 MG tablet Take 2 tablets (650 mg total) by mouth every 6 (six) hours as needed for mild pain, fever or  headache. 02/25/20   Regalado, Belkys A, MD  albuterol (VENTOLIN HFA) 108 (90 Base) MCG/ACT inhaler Inhale 2 puffs into the lungs daily as needed for wheezing or shortness of breath. 11/11/15   [provider]  escitalopram (LEXAPRO) 20 MG tablet Take 20 mg by mouth daily. 02/03/20   [provider]  Fluticasone-Salmeterol (ADVAIR) 100-50 MCG/DOSE AEPB Inhale 1 puff into the lungs  in the morning and at bedtime. 03/01/15   [provider]  folic acid (FOLVITE) 1 MG tablet Take 1 tablet (1 mg total) by mouth daily. 02/26/20   Regalado, Belkys A, MD  melatonin 5 MG TABS Take 1 tablet (5 mg total) by mouth at bedtime. 02/25/20   Regalado, Belkys A, MD  nicotine (NICODERM CQ - DOSED IN MG/24 HOURS) 14 mg/24hr patch Place 1 patch (14 mg total) onto the skin daily. 02/26/20   Regalado, Belkys A, MD  omeprazole (PRILOSEC OTC) 20 MG tablet Take 20 mg by mouth daily.    [provider]  polyethylene glycol (MIRALAX / GLYCOLAX) 17 g packet Take 17 g by mouth 2 (two) times daily. 02/25/20   Regalado, Belkys A, MD  tamsulosin (FLOMAX) 0.4 MG CAPS capsule Take 0.4 mg by mouth daily. 07/10/15   [provider]    Allergies    Penicillins  Review of Systems   Review of Systems  Constitutional: Negative for chills, diaphoresis and fever.  Respiratory: Positive for shortness of breath. Negative for cough and wheezing.   Cardiovascular: Positive for chest pain.  Gastrointestinal: Negative for abdominal pain, nausea and vomiting.  Genitourinary: Negative for difficulty urinating.  Musculoskeletal: Positive for arthralgias.  Skin: Negative for rash and wound.  Allergic/Immunologic: Positive for immunocompromised state.  Neurological: Positive for weakness.  Psychiatric/Behavioral: Negative for confusion.  All other systems reviewed and are negative.   Physical Exam Updated Vital Signs BP 96/70 (BP Location: Left Arm)   Pulse 95   Temp 98.1 F (36.7 C) (Oral)   Resp 18   Ht 6' (1.829 m)   Wt 70.8 kg   SpO2 95%   BMI 21.16 kg/m   Physical Exam Vitals and nursing note reviewed.  Constitutional:      General: He is not in acute distress.    Appearance: He is not diaphoretic.     Comments: Chronically ill appearing  HENT:     Head: Normocephalic and atraumatic.  Cardiovascular:     Rate and Rhythm: Regular rhythm. Tachycardia present.  Pulmonary:      Effort: No tachypnea.     Breath sounds: Normal breath sounds. No decreased breath sounds.     Comments: Speaks in short phrases  Chest:     Chest wall: No tenderness.  Abdominal:     Palpations: Abdomen is soft.     Tenderness: There is no abdominal tenderness.  Musculoskeletal:     Cervical back: Neck supple.     Right lower leg: No edema.     Left lower leg: No edema.  Skin:    General: Skin is warm and dry.     Findings: No erythema or rash.  Neurological:     Mental Status: He is alert and oriented to person, place, and time.  Psychiatric:        Behavior: Behavior normal.     ED Results / Procedures / Treatments   Labs (all labs ordered are listed, but only abnormal results are displayed) Labs Reviewed  BASIC METABOLIC PANEL - Abnormal; Notable for the following  components:      Result Value   Sodium 124 (*)    Chloride 87 (*)    Glucose, Bld 144 (*)    BUN 32 (*)    Creatinine, Ser 1.38 (*)    GFR, Estimated 56 (*)    All other components within normal limits  CBC WITH DIFFERENTIAL/PLATELET - Abnormal; Notable for the following components:   WBC 12.8 (*)    RBC 4.03 (*)    Hemoglobin 11.3 (*)    HCT 33.1 (*)    Neutro Abs 10.9 (*)    Lymphs Abs 0.5 (*)    Monocytes Absolute 1.2 (*)    Abs Immature Granulocytes 0.16 (*)    All other components within normal limits  BRAIN NATRIURETIC PEPTIDE - Abnormal; Notable for the following components:   B Natriuretic Peptide 615.3 (*)    All other components within normal limits  RESP PANEL BY RT-PCR (FLU A&B, COVID) [id number]  TROPONIN I (HIGH SENSITIVITY)  TROPONIN I (HIGH SENSITIVITY)    EKG EKG Interpretation  Date/Time:  Thursday April 04 2020 10:17:30 EST Ventricular Rate:  104 PR Interval:  152 QRS Duration: 64 QT Interval:  304 QTC Calculation: 399 R Axis:   79 Text Interpretation: Sinus tachycardia Otherwise normal ECG Confirmed by Davonna Belling [phone]) on 04/04/2020 10:41:58 AM   Radiology DG  Chest 2 View  Result Date: 04/04/2020 CLINICAL DATA:  Shortness of breath.  Recent fall EXAM: CHEST - 2 VIEW COMPARISON:  February 23, 2017 chest radiograph and chest CT FINDINGS: There is underlying emphysematous change. There is scarring in the left upper lobe region with apparent cavitary lesion again noted in the left upper lobe region measuring 2.0 x 1.7 cm. Multiple nodular appearing opacities are noted bilaterally. No consolidation. Heart size is normal. Pulmonary vascularity is stable, reflecting underlying emphysematous change. No adenopathy is appreciable by radiography. No bone lesions. No pneumothorax. IMPRESSION: Underlying emphysematous change. Scarring left upper lobe with stable cavitary lesion left upper lobe. Nodular opacities bilaterally likely represent metastatic foci. No edema or consolidation. Heart size normal. No pneumothorax. Electronically Signed   By: Lowella Grip III M.D.   On: 04/04/2020 12:22   DG Shoulder Right  Result Date: 04/04/2020 CLINICAL DATA:  Pain following fall EXAM: RIGHT SHOULDER - 2+ VIEW COMPARISON:  None. FINDINGS: Frontal, oblique, and Y scapular images were obtained. No fracture or dislocation. Joint spaces appear normal. No erosive change. Visualized right lung clear. IMPRESSION: No fracture or dislocation.  No evident arthropathy. Electronically Signed   By: Lowella Grip III M.D.   On: 04/04/2020 12:20    Procedures .Critical Care Performed by: Tacy Learn, PA-C Authorized by: Tacy Learn, PA-C   Critical care provider statement:    Critical care time (minutes):  45   Critical care was time spent personally by me on the following activities:  Discussions with consultants, evaluation of patient's response to treatment, examination of patient, ordering and performing treatments and interventions, ordering and review of laboratory studies, ordering and review of radiographic studies, pulse oximetry, re-evaluation of patient's condition,  obtaining history from patient or surrogate and review of old charts     Medications Ordered in ED Medications  morphine 4 MG/ML injection 4 mg (4 mg Intravenous Given 04/04/20 1141)  furosemide (LASIX) injection 40 mg (40 mg Intravenous Given 04/04/20 1425)    ED Course  I have reviewed the triage vital signs and the nursing notes.  Pertinent labs & imaging results that were  available during my care of the patient were reviewed by me and considered in my medical decision making (see chart for details).  Clinical Course as of 04/04/20 1431  Thu Apr 04, 7853  3317 65 year old male with complex medical history including metastatic colon cancer, recently admitted with pericarditis and pneumonia.  Presents with generalized weakness and shortness of breath. On assessment, patient speaks in short phrases with a whispering voice, he is on the monitor and tachycardic in the 120s with O2 sats dropping to 86% as he talks, improved to low mid 90s with rest. Patient was placed on 2 L nasal cannula, heart rate has improved into the 90s, respiratory rate has improved.  Maintains O2 sats of 96% or greater. Chest x-ray with chronic changes, no acute findings.  CBC unchanged from prior with white count of 12.8.  BMP, found to have worsening hyponatremia, was discharged from [facility] with sodium of 128, is 124 today.  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 Initial troponin is 15, repeat is pending.  BNP is 615, no prior for comparison, no lower extremity edema, no obvious volume overload on chest x-ray. X-ray of right shoulder also done for history of fall with shoulder pain, no acute bony abnormality. Discussed results and plan of care with patient, plan is for admission for new oxygen requirement and monitoring.  Patient would like to be admitted to [facility]. [LM]  1411 Case discussed with Dr. Neysa Bonito at [facility] who has reviewed patient's chart, feel patient can be better treated at [facility] in case  cardiology as needed for this pericarditis history.  Case discussed with Dr. Roosevelt Locks at [facility] who accepts for admission to stepdown. [LM]    Clinical Course User Index [LM] Roque Lias   MDM Rules/Calculators/A&P                          Final Clinical Impression(s) / ED Diagnoses Final diagnoses:  Hypoxia  Shortness of breath  Hyponatremia    Rx / DC Orders ED Discharge Orders    None       Tacy Learn, PA-C 04/04/20 1431    Davonna Belling, MD 04/04/20 1451

## 2020-04-04 NOTE — ED Notes (Addendum)
Unable to get vitals as patient is at [facility]

## 2020-04-04 NOTE — ED Triage Notes (Addendum)
States since  yesterday has been weak and sob , has been  In hospital for  Lung cancer and pericarditis  With in the last 2 weeks , saw oncologist on Monday

## 2020-04-04 NOTE — ED Notes (Signed)
Covid Swab obtained and to the lab 

## 2020-04-05 ENCOUNTER — Inpatient Hospital Stay (HOSPITAL_COMMUNITY): Payer: Medicare Other

## 2020-04-05 DIAGNOSIS — N179 Acute kidney failure, unspecified: Secondary | ICD-10-CM

## 2020-04-05 DIAGNOSIS — I313 Pericardial effusion (noninflammatory): Secondary | ICD-10-CM

## 2020-04-05 DIAGNOSIS — E871 Hypo-osmolality and hyponatremia: Secondary | ICD-10-CM | POA: Diagnosis not present

## 2020-04-05 DIAGNOSIS — C689 Malignant neoplasm of urinary organ, unspecified: Secondary | ICD-10-CM

## 2020-04-05 DIAGNOSIS — R590 Localized enlarged lymph nodes: Secondary | ICD-10-CM | POA: Diagnosis not present

## 2020-04-05 DIAGNOSIS — J432 Centrilobular emphysema: Secondary | ICD-10-CM

## 2020-04-05 LAB — BASIC METABOLIC PANEL
Anion gap: 12 (ref 5–15)
BUN: 29 mg/dL — ABNORMAL HIGH (ref 8–23)
CO2: 25 mmol/L (ref 22–32)
Calcium: 9.3 mg/dL (ref 8.9–10.3)
Chloride: 87 mmol/L — ABNORMAL LOW (ref 98–111)
Creatinine, Ser: 1.71 mg/dL — ABNORMAL HIGH (ref 0.61–1.24)
GFR, Estimated: 44 mL/min — ABNORMAL LOW (ref >60)
Glucose, Bld: 96 mg/dL (ref 70–99)
Potassium: 4.7 mmol/L (ref 3.5–5.1)
Sodium: 124 mmol/L — ABNORMAL LOW (ref 135–145)

## 2020-04-05 LAB — ECHOCARDIOGRAM COMPLETE
AR max vel: 3.46 cm2
AV Area VTI: 3.63 cm2
AV Area mean vel: 3.18 cm2
AV Mean grad: 3 mmHg
AV Peak grad: 4.5 mmHg
Ao pk vel: 1.06 m/s
Area-P 1/2: 3.99 cm2
Height: 72 in
S' Lateral: 3.8 cm
Weight: 2518.4 oz

## 2020-04-05 LAB — ACID FAST CULTURE WITH REFLEXED SENSITIVITIES (MYCOBACTERIA): Acid Fast Culture: NEGATIVE

## 2020-04-05 MED ORDER — ESCITALOPRAM OXALATE 10 MG PO TABS
20.0000 mg | ORAL_TABLET | Freq: Every day | ORAL | Status: DC
  Administered 2020-04-05 – 2020-04-08 (×4): 20 mg via ORAL
  Filled 2020-04-05 (×4): qty 2

## 2020-04-05 MED ORDER — DEXTROSE-NACL 5-0.9 % IV SOLN: INTRAVENOUS | Status: DC

## 2020-04-05 MED ORDER — POLYETHYLENE GLYCOL 3350 17 G PO PACK
17.0000 g | PACK | Freq: Two times a day (BID) | ORAL | Status: DC
  Administered 2020-04-05 – 2020-04-08 (×6): 17 g via ORAL
  Filled 2020-04-05 (×6): qty 1

## 2020-04-05 MED ORDER — ENOXAPARIN SODIUM 40 MG/0.4ML ~~LOC~~ SOLN
40.0000 mg | SUBCUTANEOUS | Status: DC
  Administered 2020-04-05 – 2020-04-08 (×4): 40 mg via SUBCUTANEOUS
  Filled 2020-04-05 (×4): qty 0.4

## 2020-04-05 MED ORDER — OXYCODONE HCL 5 MG PO TABS
5.0000 mg | ORAL_TABLET | Freq: Four times a day (QID) | ORAL | Status: DC | PRN
  Administered 2020-04-05 – 2020-04-06 (×3): 5 mg via ORAL
  Filled 2020-04-05 (×3): qty 1

## 2020-04-05 MED ORDER — MORPHINE SULFATE ER 15 MG PO TBCR
15.0000 mg | EXTENDED_RELEASE_TABLET | Freq: Two times a day (BID) | ORAL | Status: DC
Start: 2020-04-05 — End: 2020-04-07
  Administered 2020-04-05 – 2020-04-07 (×5): 15 mg via ORAL
  Filled 2020-04-05 (×5): qty 1

## 2020-04-05 MED ORDER — MOMETASONE FURO-FORMOTEROL FUM 100-5 MCG/ACT IN AERO
2.0000 | INHALATION_SPRAY | Freq: Two times a day (BID) | RESPIRATORY_TRACT | Status: DC
  Administered 2020-04-05 – 2020-04-08 (×6): 2 via RESPIRATORY_TRACT
  Filled 2020-04-05: qty 8.8

## 2020-04-05 MED ORDER — MELATONIN 5 MG PO TABS
5.0000 mg | ORAL_TABLET | Freq: Every day | ORAL | Status: DC
  Administered 2020-04-05 – 2020-04-07 (×3): 5 mg via ORAL
  Filled 2020-04-05 (×3): qty 1

## 2020-04-05 MED ORDER — PREDNISONE 10 MG PO TABS
10.0000 mg | ORAL_TABLET | Freq: Every day | ORAL | Status: DC
Start: 2020-04-05 — End: 2020-04-08
  Administered 2020-04-05 – 2020-04-08 (×4): 10 mg via ORAL
  Filled 2020-04-05 (×4): qty 1

## 2020-04-05 MED ORDER — ADULT MULTIVITAMIN W/MINERALS CH
1.0000 | ORAL_TABLET | Freq: Every day | ORAL | Status: DC
  Administered 2020-04-05 – 2020-04-08 (×4): 1 via ORAL
  Filled 2020-04-05 (×4): qty 1

## 2020-04-05 MED ORDER — PANTOPRAZOLE SODIUM 40 MG PO TBEC
40.0000 mg | DELAYED_RELEASE_TABLET | Freq: Every day | ORAL | Status: DC
  Administered 2020-04-05 – 2020-04-08 (×4): 40 mg via ORAL
  Filled 2020-04-05 (×4): qty 1

## 2020-04-05 MED ORDER — FOLIC ACID 1 MG PO TABS
1.0000 mg | ORAL_TABLET | Freq: Every day | ORAL | Status: DC
  Administered 2020-04-05 – 2020-04-08 (×4): 1 mg via ORAL
  Filled 2020-04-05 (×4): qty 1

## 2020-04-05 MED ORDER — LORAZEPAM 0.5 MG PO TABS: 0.5000 mg | ORAL_TABLET | Freq: Four times a day (QID) | ORAL | Status: DC | PRN

## 2020-04-05 MED ORDER — IOHEXOL 350 MG/ML SOLN
100.0000 mL | Freq: Once | INTRAVENOUS | Status: AC | PRN
  Administered 2020-04-05: 100 mL via INTRAVENOUS

## 2020-04-05 MED ORDER — TAMSULOSIN HCL 0.4 MG PO CAPS
0.4000 mg | ORAL_CAPSULE | Freq: Every day | ORAL | Status: DC
  Administered 2020-04-05 – 2020-04-08 (×4): 0.4 mg via ORAL
  Filled 2020-04-05 (×4): qty 1

## 2020-04-05 MED ORDER — ENSURE ENLIVE PO LIQD
237.0000 mL | Freq: Three times a day (TID) | ORAL | Status: DC
  Administered 2020-04-05 – 2020-04-08 (×8): 237 mL via ORAL

## 2020-04-05 MED ORDER — COLCHICINE 0.6 MG PO TABS
0.6000 mg | ORAL_TABLET | Freq: Every day | ORAL | Status: DC
  Administered 2020-04-05 – 2020-04-08 (×4): 0.6 mg via ORAL
  Filled 2020-04-05 (×4): qty 1

## 2020-04-05 MED ORDER — OMEPRAZOLE MAGNESIUM 20 MG PO TBEC: 20.0000 mg | DELAYED_RELEASE_TABLET | Freq: Every day | ORAL | Status: DC

## 2020-04-05 NOTE — Progress Notes (Signed)
PROGRESS NOTE    Corey Dixon  KKX:[id number] DOB: 1955/07/04 DOA: 04/04/2020 PCP: Arman Bogus., MD    Brief Narrative:  Corey Dixon was admitted to the hospital with Hyponatremia and AKI due to dehydration, in the setting of progressive metastatic urothelial cancer.  65 year old male past medical history for bladder cancer, COPD and chronic pericardial effusion, who presented with dyspnea.  He had a recent hospitalization for sepsis due to pneumonia, complicated with pericarditis (03/13/20 to 03/22/20 [facility]), discharged on colchicine and prednisone. COVID 19 in 01/2020 At home patient had new onset of worsening dyspnea especially on exertion, prompting him to come back to the hospital.   After his discharge from [facility], he continue to be very weak and deconditioned but able to do home PT, over last 48 hrs before hospitalization he had decrease po intake and PT noted him to be more weak.  On his initial physical examination his temperature was 98.1, blood pressure 94/76, heart rate 103, respiratory rate 20, oxygen saturation 92%.  His lungs were clear to auscultation bilaterally, heart S1-S2, present, tachycardic, abdomen soft nontender, no lower extremity edema.  Sodium 124, potassium 4.9, chloride 87, bicarb 24, glucose 144, BUN 32, creatinine 1.38, BNP 615, white count 12.8, hemoglobin 9.3, hematocrit 33.1, platelets 253. SARS COVID-19 negative.  Chest radiograph with hyperinflation, right hemidiaphragm elevation, bilateral nodules. Chest CT with no evidence of pulmonary embolism, increase in size and number of bilateral pulmonary nodules consistent with worsening metastatic disease.  Left upper lobe cavitation 2.2 x 2.9 cm.  Extensive mediastinal and hilar adenopathy. Diffuse emphysema.  Lytic lesion on the right fifth rib concerning for osseous metastatic disease, pathologic fracture involving the right coracoid with extension into the inferior glenoid articular surface.    EKG 104  bpm, normal axis, normal intervals, sinus rhythm, no ST segment or T wave changes.  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 Acute hyponatremia and AKI, due to dehydration. Serum Na continue to be low at 124, with K at 4,7 and bicarbonate at 25, worsening renal function with serum cr at 1,71. Documented urine output 400 cc.  Patient clinically dry.   Hypovolemic hyponatremia.  Plan to start patient on NS and dextrose at 75 ml per hr and follow up renal function in am, avoid hypotension and nephrotoxic medications. Strict in and out. CT upper abdomen showed left kidney unremarkable. Note prior right nephrectomy.  2. Chronic pericardial effusion/ pericarditis. Patient hemodynamically stable, will follow up on repeat echocardiogram.  Continue telemetry monitoring for now.   On colchicine and prednisone.   3. 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 Patient with persistent dyspnea, CT chest with no pulmonary embolism. No wheezing or increase sputum production.  No clinical signs of decompensation.   Continue oxymetry monitoring and as needed supplemental 02. On bronchodilator therapy as needed. On dulera.  Leukocytosis looks reactive, continue to hold on antibiotic therapy.   4. Bladder cancer status post TURP x2, urothelial cancer status post right nephro-uretheroctomy, with metastasis to bone, liver and lung. Pathologic fractures 5th right rib and right shoulder.  Malignancy seems to be progressive and aggressive, currently with very poor functional status. Will follow up with oncology as outpatient.  Patient has poor prognosis.   Pathologic  fractures, continue pain control with morphine and oxycodone.  Consult nutrition.   5.  Depression. Continue with escitalopram. PRN lorazepam.   Patient continue to be at high risk for worsening renal failure and hyponatremia   Status is: Inpatient  Remains inpatient appropriate because:IV treatments appropriate due to intensity of illness or inability to take PO   Dispo: The patient is from: Home              Anticipated d/c is to: Home              Patient currently is not medically stable to d/c.   Difficult to place patient No  DVT prophylaxis: Enoxaparin   Code Status:   full  Family Communication:  I spoke over the phone with the patient's wife about patient's  condition, plan of care, prognosis and all questions were addressed.     Subjective: Patient continue to be very weak and deconditioned, positive dyspnea at rest not in pain, no nausea or vomiting, .  Objective: Vitals:   04/05/20 0007 04/05/20 0305 04/05/20 0513 04/05/20 0853  BP: 94/76 97/70 101/71 101/74  Pulse: 99 97  95  Resp: 19 13 15 18   Temp: 98 F (36.7 C) 98.5 F (36.9 C) 97.9 F (36.6 C) (!) 96.9 F (36.1 C)  TempSrc: Oral Oral Oral Oral  SpO2: 92%  94% 97%  Weight:      Height:        Intake/Output Summary (Last 24 hours) at 04/05/2020 1134 Last data filed at 04/05/2020 1002 Gross per 24 hour  Intake 3 ml  Output 400 ml  Net -397 ml   Filed Weights   04/04/20 1001 04/04/20 1836  Weight: 70.8 kg 71.4 kg    Examination:   General: deconditioned and ill looking appearing, positive dyspnea at rest.  Neurology: Awake and alert, non focal  E ENT: positive pallor, no icterus, oral mucosa moist Cardiovascular: No JVD. S1-S2 present, rhythmic, no gallops, rubs, or murmurs. No lower extremity edema. Pulmonary: positive breath sounds bilaterally, poor ventilation but no wheezing, rhonchi or rales. Gastrointestinal. Abdomen soft and non tender Skin. No rashes Musculoskeletal: no joint deformities     Data Reviewed: I have personally reviewed following labs and imaging  studies  CBC: Recent Labs  Lab 04/04/20 1100 04/04/20 1922  WBC 12.8* 12.5*  NEUTROABS 10.9*  --   HGB 11.3* 11.1*  HCT 33.1* 32.6*  MCV 82.1 82.3  PLT 253 220   Basic Metabolic Panel: Recent Labs  Lab 04/04/20 1100 04/04/20 1922 04/05/20 0311  NA 124*  --  124*  K 4.9  --  4.7  CL 87*  --  87*  CO2 24  --  25  GLUCOSE 144*  --  96  BUN 32*  --  29*  CREATININE 1.38* 1.57* 1.71*  CALCIUM 9.0  --  9.3   GFR: Estimated Creatinine Clearance: 42.9 mL/min (A) (by C-G formula based on SCr of 1.71 mg/dL (H)). Liver Function Tests: No results for input(s): AST, ALT, ALKPHOS, BILITOT, PROT, ALBUMIN in the last 168 hours. No results for input(s): LIPASE, AMYLASE in the last 168 hours. No results for input(s): AMMONIA in the last 168 hours. Coagulation Profile: No results for input(s): INR, PROTIME in the last 168 hours. Cardiac Enzymes: No results for input(s): CKTOTAL, CKMB, CKMBINDEX, TROPONINI in the last 168 hours. BNP (last 3 results) No results for input(s): PROBNP in the last 8760 hours. HbA1C: No results for input(s): HGBA1C in the  last 72 hours. CBG: No results for input(s): GLUCAP in the last 168 hours. Lipid Profile: No results for input(s): CHOL, HDL, LDLCALC, TRIG, CHOLHDL, LDLDIRECT in the last 72 hours. Thyroid Function Tests: No results for input(s): TSH, T4TOTAL, FREET4, T3FREE, THYROIDAB in the last 72 hours. Anemia Panel: No results for input(s): VITAMINB12, FOLATE, FERRITIN, TIBC, IRON, RETICCTPCT in the last 72 hours.    Radiology Studies: I have reviewed all of the imaging during this hospital visit personally     Scheduled Meds: . colchicine  0.6 mg Oral Daily  . escitalopram  20 mg Oral Daily  . folic acid  1 mg Oral Daily  . heparin  5,000 Units Subcutaneous Q8H  . melatonin  5 mg Oral QHS  . mometasone-formoterol  2 puff Inhalation BID  . morphine  15 mg Oral Q12H  . pantoprazole  40 mg Oral Daily  . polyethylene glycol  17 g Oral  BID  . predniSONE  10 mg Oral Daily  . sodium chloride flush  3 mL Intravenous Q12H  . tamsulosin  0.4 mg Oral Daily   Continuous Infusions: . sodium chloride       LOS: 1 day        Charina Fons Gerome Apley, MD

## 2020-04-05 NOTE — Plan of Care (Signed)

## 2020-04-05 NOTE — Progress Notes (Signed)
  Echocardiogram 2D Echocardiogram has been performed with [facility].  Corey Dixon 04/05/2020, 11:58 AM

## 2020-04-05 NOTE — Progress Notes (Signed)
Pt's wife called with concerns of pt's sodium level, fluids, diet and pain management for pt.   Nurse explained to pt's wife plan of care and that pt has pain med orders in place and that pt was currently eating.  MD was paged to make aware of pt's spouse wishes and concerns. With name and number provided to MD.

## 2020-04-05 NOTE — TOC Progression Note (Addendum)
Transition of Care [facility]) - Progression Note    Patient Details  Name: Corey Dixon MRN: [id number] Date of Birth: Dec 11, 1955  Transition of Care [facility] CM/SW Contact  Zenon Mayo, RN Phone Number: 04/05/2020, 4:09 PM  Clinical Narrative:    Patient is active with [facility] for [place]. Will need resumption orders prior to dc. He will like to continue with [facility].  He also would like to have a rollator , he is ok with [employer] supplying this for him.  NCM made referral to [employer] for rollator. He has a scale at home, a bp cuff ,  Most likely dc on Monday.  Await pt eval to see what recs are.        Expected Discharge Plan and Services                                                 Social Determinants of Health (SDOH) Interventions    Readmission Risk Interventions No flowsheet data found.

## 2020-04-05 NOTE — Progress Notes (Signed)
Pt's pain medication was given early today.   This nurse notified MD regarding pain management.  Orders for PO pain medication PRN added.  Pt had his CT completed this am.   Diet orders were added and pt is currently eating breakfast.

## 2020-04-05 NOTE — Progress Notes (Signed)
Initial Nutrition Assessment  DOCUMENTATION CODES:   Not applicable  INTERVENTION:   -Ensure Enlive po TID, each supplement provides 350 kcal and 20 grams of protein -MVI with minerals daily  NUTRITION DIAGNOSIS:   Inadequate oral intake related to decreased appetite as evidenced by meal completion < 25%,per patient/family report.  GOAL:   Patient will meet greater than or equal to 90% of their needs  MONITOR:   PO intake,Supplement acceptance,Labs,Weight trends,Skin,I & O's  REASON FOR ASSESSMENT:   Consult Assessment of nutrition requirement/status  ASSESSMENT:   Corey Dixon is a 65 y.o. male with medical history significant of Chronic stable pericardial effusion, bladder cancer, COPD, who was recently admitted to the hospital with pericarditis and pneumonia presenting now with progressive shortness of breath weakness and cough.  Pt admitted with chest pain and shortness of breath.   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 Pt with poor prognosis per MD.   Damaris Schooner with pt at bedside, who spoke in a whisper and reports that it hurts to talk. Pt reports decreased appetite over the past 5 weeks secondary to COVID. Pt shares that he has lost his taste and smell and this has affected his appetite. Additionally, pt reports decline in respiratory failure makes it difficult to eat as "it is hard to know when my last breath is". Observed lunch tray- pt consumed a few bites of his salad.   Reviewed wt hx; pt has experienced a 8.5% wt loss over the past month, which is significant for time frame. Per pt, he believes wt loss is related to fluid loss.   Discussed importance of good meal and supplement intake to promote healing. Pt amenable to Ensure supplements; he usually consumes 1-2 per day PTA.  Medications reviewed and include folvite, melatonin, miralax,  prednisone, and dextrose 5%-0.9% sodium chloride infusion @ 75 ml/hr  Labs reviewed: Na: 124.   NUTRITION - FOCUSED PHYSICAL EXAM:  Flowsheet Row Most Recent Value  Orbital Region No depletion  Upper Arm Region No depletion  Thoracic and Lumbar Region No depletion  Buccal Region No depletion  Temple Region Mild depletion  Clavicle Bone Region No depletion  Clavicle and Acromion Bone Region No depletion  Scapular Bone Region No depletion  Dorsal Hand Mild depletion  Patellar Region No depletion  Anterior Thigh Region No depletion  Posterior Calf Region No depletion  Edema (RD Assessment) Mild  Hair Reviewed  Eyes Reviewed  Mouth Reviewed  Skin Reviewed  Nails Reviewed       Diet Order:   Diet Order            Diet Heart Room service appropriate? Yes; Fluid consistency: Thin  Diet effective now                 EDUCATION NEEDS:   Education needs have been addressed  Skin:  Skin Assessment: Reviewed RN Assessment  Last BM:  04/01/20  Height:   Ht Readings from Last 1 Encounters:  04/04/20 6' (1.829 m)    Weight:   Wt Readings from Last 1 Encounters:  04/05/20 71.4 kg    Ideal Body Weight:  80.9 kg  BMI:  Body mass index is 21.35 kg/m.  Estimated Nutritional Needs:   Kcal:  2150-2350  Protein:  105-120 grams  Fluid:  > 2 L    Elcie Pelster W, RD,  LDN, [place] Registered Dietitian II Certified Diabetes Care and Education Specialist Please refer to [facility] for RD and/or RD on-call/weekend/after hours pager

## 2020-04-05 NOTE — Progress Notes (Signed)
Breakfast ordered 

## 2020-04-05 NOTE — Progress Notes (Signed)
Heart Failure Navigator Progress Note  Assessed for Heart & Vascular TOC clinic readiness.  Unfortunately at this time the patient does not meet criteria due to bladder CA with known (and increasing) pulmonary metastatic disease.  Navigator available for reassessment of patient.   Pricilla Holm, RN, BSN Heart Failure Nurse Navigator [phone]

## 2020-04-06 DIAGNOSIS — J432 Centrilobular emphysema: Secondary | ICD-10-CM | POA: Diagnosis not present

## 2020-04-06 DIAGNOSIS — N179 Acute kidney failure, unspecified: Secondary | ICD-10-CM | POA: Diagnosis not present

## 2020-04-06 DIAGNOSIS — R590 Localized enlarged lymph nodes: Secondary | ICD-10-CM | POA: Diagnosis not present

## 2020-04-06 DIAGNOSIS — E871 Hypo-osmolality and hyponatremia: Secondary | ICD-10-CM | POA: Diagnosis not present

## 2020-04-06 LAB — CBC WITH DIFFERENTIAL/PLATELET
Abs Immature Granulocytes: 0.13 10*3/uL — ABNORMAL HIGH (ref 0.00–0.07)
Basophils Absolute: 0 10*3/uL (ref 0.0–0.1)
Basophils Relative: 0 %
Eosinophils Absolute: 0.1 10*3/uL (ref 0.0–0.5)
Eosinophils Relative: 1 %
HCT: 28.8 % — ABNORMAL LOW (ref 39.0–52.0)
Hemoglobin: 9.7 g/dL — ABNORMAL LOW (ref 13.0–17.0)
Immature Granulocytes: 1 %
Lymphocytes Relative: 6 %
Lymphs Abs: 0.7 10*3/uL (ref 0.7–4.0)
MCH: 28 pg (ref 26.0–34.0)
MCHC: 33.7 g/dL (ref 30.0–36.0)
MCV: 83 fL (ref 80.0–100.0)
Monocytes Absolute: 1 10*3/uL (ref 0.1–1.0)
Monocytes Relative: 9 %
Neutro Abs: 8.9 10*3/uL — ABNORMAL HIGH (ref 1.7–7.7)
Neutrophils Relative %: 83 %
Platelets: 211 10*3/uL (ref 150–400)
RBC: 3.47 MIL/uL — ABNORMAL LOW (ref 4.22–5.81)
RDW: 13 % (ref 11.5–15.5)
WBC: 10.8 10*3/uL — ABNORMAL HIGH (ref 4.0–10.5)
nRBC: 0 % (ref 0.0–0.2)

## 2020-04-06 LAB — BASIC METABOLIC PANEL
Anion gap: 10 (ref 5–15)
BUN: 23 mg/dL (ref 8–23)
CO2: 25 mmol/L (ref 22–32)
Calcium: 9 mg/dL (ref 8.9–10.3)
Chloride: 93 mmol/L — ABNORMAL LOW (ref 98–111)
Creatinine, Ser: 1.46 mg/dL — ABNORMAL HIGH (ref 0.61–1.24)
GFR, Estimated: 53 mL/min — ABNORMAL LOW (ref >60)
Glucose, Bld: 130 mg/dL — ABNORMAL HIGH (ref 70–99)
Potassium: 4.2 mmol/L (ref 3.5–5.1)
Sodium: 128 mmol/L — ABNORMAL LOW (ref 135–145)

## 2020-04-06 MED ORDER — MORPHINE SULFATE (PF) 2 MG/ML IV SOLN
2.0000 mg | INTRAVENOUS | Status: DC | PRN
  Administered 2020-04-06: 2 mg via INTRAVENOUS
  Filled 2020-04-06: qty 1

## 2020-04-06 MED ORDER — OXYCODONE HCL 5 MG PO TABS
5.0000 mg | ORAL_TABLET | Freq: Four times a day (QID) | ORAL | Status: DC | PRN
  Administered 2020-04-07: 5 mg via ORAL
  Filled 2020-04-06: qty 1

## 2020-04-06 NOTE — Progress Notes (Signed)
Pt said he will wear the CPAP by himself, told him to let the RN know if he need any help.

## 2020-04-06 NOTE — Progress Notes (Signed)
Pt feeling incredibly SOB today and can only do 1 word sentences. Pt spo2 93% on RA but placed on [place] HFNC at 6L for flow to help with SOB. RT will contact MD to discuss further options.

## 2020-04-06 NOTE — Progress Notes (Signed)
OT Cancellation Note  Patient Details Name: Corey Dixon MRN: [id number] DOB: 1955-06-11   Cancelled Treatment:    Reason Eval/Treat Not Completed: Patient declined, wants to discuss goals of care with family prior to proceeding with acute rehab.  OT will continue efforts as appropriate.    Richard D Popella 04/06/2020, 11:21 AM

## 2020-04-06 NOTE — Progress Notes (Signed)
PROGRESS NOTE    Corey Dixon  YTK:[id number] DOB: 1955-10-08 DOA: 04/04/2020 PCP: Arman Bogus., MD    Brief Narrative:  Mr. Corey Dixon was admitted to the hospital with Hyponatremia and AKI due to dehydration, in the setting of progressive metastatic urothelial cancer.  65 year old male past medical history for bladder cancer, COPD and chronic pericardial effusion, who presented with dyspnea.  He had a recent hospitalization for sepsis due to pneumonia, complicated with pericarditis (03/13/20 to 03/22/20 [facility]), discharged on colchicine and prednisone. COVID 19 in 01/2020 At home patient had new onset of worsening dyspnea especially on exertion, prompting him to come back to the hospital.   After his discharge from [facility], he continue to be very weak and deconditioned but able to do home PT, over last 48 hrs before hospitalization he had decrease po intake and PT noted him to be more weak.  On his initial physical examination his temperature was 98.1, blood pressure 94/76, heart rate 103, respiratory rate 20, oxygen saturation 92%.  His lungs were clear to auscultation bilaterally, heart S1-S2, present, tachycardic, abdomen soft nontender, no lower extremity edema.  Sodium 124, potassium 4.9, chloride 87, bicarb 24, glucose 144, BUN 32, creatinine 1.38, BNP 615, white count 12.8, hemoglobin 9.3, hematocrit 33.1, platelets 253. SARS COVID-19 negative.  Chest radiograph with hyperinflation, right hemidiaphragm elevation, bilateral nodules. Chest CT with no evidence of pulmonary embolism, increase in size and number of bilateral pulmonary nodules consistent with worsening metastatic disease.  Left upper lobe cavitation 2.2 x 2.9 cm.  Extensive mediastinal and hilar adenopathy. Diffuse emphysema.  Lytic lesion on the right fifth rib concerning for osseous metastatic disease, pathologic fracture involving the right coracoid with extension into the inferior glenoid articular surface.    EKG  104 bpm, normal axis, normal intervals, sinus rhythm, no ST segment or T wave changes.  Patient placed on IV fluids with improvement of renal function and  Hyponatremia.  Progressive and aggressive malignancy, not candidate for chemotherapy per primary oncology. Consulted Palliative Care, plan for discharge with hospice services when medically stable.   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 Acute hyponatremia and AKI on CKD stage 3a, due to dehydration.  CT upper abdomen showed left kidney unremarkable. Note prior right nephrectomy.  Patient is feeling better this am, but not yet back to his baseline, tolerating po well.  Renal function with serum cr down to 1.46 with K at 4,2 and bicarbonate at 25. Urine output is 700 cc (documented).   Continue hydration with NS and dextrose at 75 ml per hr  Plan to follow renal function in am, continue to avoid hypotension and nephrotoxic medications.  PT/OT consult, out of bed to chair tid with meals.  Discontinue telemetry.   2. Chronic pericardial effusion/ pericarditis. Follow up echocardiogram with moderate pericardial effusion, unchanged from prior exams. Located anterior to the right ventricle and surrounding the apex. No tamponade.  LV Ef 55 to 60 %.   Patient with no chest pain, continue with colchicine and prednisone.   3. 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 Acute hypoxemic respiratory failure.  CT chest with no pulmonary embolism. No wheezing or increase sputum production.  No clinical signs of decompensation.   Last night  patient required supplemental 02 this am, back on room air.   Plan to continue oxymetry monitoring and as needed supplemental 02. Continue with  bronchodilator therapy and dulera.  Wbc is down to 10.8 with no signs of bacterial infection, continue to hold on antibiotic therapy.  Incentive spirometer.   4. Bladder cancer status post TURP x2, urothelial cancer status post right nephro-uretheroctomy, with metastasis to bone, liver and lung. Pathologic fractures 5th right rib and right shoulder.  Malignancy seems to be progressive and aggressive, currently with very poor functional status.  Primary oncology has been in contact with patient's wife, patient with advance disease and not candidate for chemotherapy.   Continue pain control and nutritional supplementation. Plan to consult palliative care for inpatient evaluation, patient and his wife would like to continue care with hospice.   5. Depression. On escitalopram. PRN lorazepam.   6. Anemia of chronic disease. Stable hgb at 9,7 and hct at 28.8   Status is: Inpatient  Remains inpatient appropriate because:IV treatments appropriate due to intensity of illness or inability to take PO   Dispo: The patient is from: Home              Anticipated d/c is to: Home              Patient currently is not medically stable to d/c.   Difficult to place patient No   DVT prophylaxis: Enoxaparin   Code Status:   Full  Family Communication:  I spoke over the phone with the patient's wife about patient's  condition, plan of care, prognosis and all questions were addressed.     Nutrition Status: Nutrition Problem: Inadequate oral intake Etiology: decreased appetite Signs/Symptoms: meal completion < 25%,per patient/family report Interventions: Ensure Enlive (each supplement provides 350kcal and 20 grams of protein),MVI     Consultants:   Palliative care   Subjective: Patient last nigh with worsening dyspnea, this am improved but not yet back to his baseline.   Objective: Vitals:   04/06/20 0019 04/06/20 0431 04/06/20 0745 04/06/20 0822  BP: 105/74 109/79  105/60  Pulse: 85  89  90  Resp: 18   20  Temp: 97.7 F (36.5 C)   97.8 F (36.6 C)  TempSrc: Oral   Oral  SpO2: 90% 96% 92% 92%  Weight:  71.5 kg    Height:        Intake/Output Summary (Last 24 hours) at 04/06/2020 0828 Last data filed at 04/06/2020 0826 Gross per 24 hour  Intake 1307.63 ml  Output 1000 ml  Net 307.63 ml   Filed Weights   04/04/20 1836 04/05/20 0500 04/06/20 0431  Weight: 71.4 kg 71.4 kg 71.5 kg    Examination:   General: deconditioned and ill looking appearing  Neurology: Awake and alert, non focal  E ENT: positive pallor, no icterus, oral mucosa moist Cardiovascular: No JVD. S1-S2 present, rhythmic, no gallops, rubs, or murmurs. No lower extremity edema. Pulmonary: positive breath sounds bilaterally, with no wheezing, rhonchi or rales. Gastrointestinal. Abdomen soft and non tender Skin. No rashes Musculoskeletal: no joint deformities     Data Reviewed: I have personally reviewed following labs and imaging studies  CBC: Recent Labs  Lab 04/04/20 1100 04/04/20 1922 04/06/20 0431  WBC 12.8* 12.5* 10.8*  NEUTROABS 10.9*  --  8.9*  HGB 11.3* 11.1* 9.7*  HCT 33.1* 32.6* 28.8*  MCV 82.1 82.3 83.0  PLT 253 245 102   Basic Metabolic Panel: Recent Labs  Lab 04/04/20 1100 04/04/20  1922 04/05/20 0311 04/06/20 0431  NA 124*  --  124* 128*  K 4.9  --  4.7 4.2  CL 87*  --  87* 93*  CO2 24  --  25 25  GLUCOSE 144*  --  96 130*  BUN 32*  --  29* 23  CREATININE 1.38* 1.57* 1.71* 1.46*  CALCIUM 9.0  --  9.3 9.0   GFR: Estimated Creatinine Clearance: 50.3 mL/min (A) (by C-G formula based on SCr of 1.46 mg/dL (H)). Liver Function Tests: No results for input(s): AST, ALT, ALKPHOS, BILITOT, PROT, ALBUMIN in the last 168 hours. No results for input(s): LIPASE, AMYLASE in the last 168 hours. No results for input(s): AMMONIA in the last 168 hours. Coagulation Profile: No results for input(s): INR, PROTIME in the last 168 hours. Cardiac Enzymes: No results for  input(s): CKTOTAL, CKMB, CKMBINDEX, TROPONINI in the last 168 hours. BNP (last 3 results) No results for input(s): PROBNP in the last 8760 hours. HbA1C: No results for input(s): HGBA1C in the last 72 hours. CBG: No results for input(s): GLUCAP in the last 168 hours. Lipid Profile: No results for input(s): CHOL, HDL, LDLCALC, TRIG, CHOLHDL, LDLDIRECT in the last 72 hours. Thyroid Function Tests: No results for input(s): TSH, T4TOTAL, FREET4, T3FREE, THYROIDAB in the last 72 hours. Anemia Panel: No results for input(s): VITAMINB12, FOLATE, FERRITIN, TIBC, IRON, RETICCTPCT in the last 72 hours.    Radiology Studies: I have reviewed all of the imaging during this hospital visit personally     Scheduled Meds: . colchicine  0.6 mg Oral Daily  . enoxaparin (LOVENOX) injection  40 mg Subcutaneous Q24H  . escitalopram  20 mg Oral Daily  . feeding supplement  237 mL Oral TID BM  . folic acid  1 mg Oral Daily  . melatonin  5 mg Oral QHS  . mometasone-formoterol  2 puff Inhalation BID  . morphine  15 mg Oral Q12H  . multivitamin with minerals  1 tablet Oral Daily  . pantoprazole  40 mg Oral Daily  . polyethylene glycol  17 g Oral BID  . predniSONE  10 mg Oral Daily  . sodium chloride flush  3 mL Intravenous Q12H  . tamsulosin  0.4 mg Oral Daily   Continuous Infusions: . sodium chloride    . dextrose 5 % and 0.9% NaCl 75 mL/hr at 04/06/20 0247     LOS: 2 days        Syla Devoss Gerome Apley, MD

## 2020-04-06 NOTE — Progress Notes (Signed)
PT Cancellation Note  Patient Details Name: Corey Dixon MRN: [id number] DOB: 1955-01-28   Cancelled Treatment:    Reason Eval/Treat Not Completed: Patient declined, no reason specified. Per discussion with patient, the patient would prefer to have goals of care discussion with family and palliative care prior to proceeding with PT session. PT will hold and await results of those discussions.   Zenaida Niece 04/06/2020, 10:10 AM

## 2020-04-06 NOTE — Plan of Care (Signed)

## 2020-04-07 DIAGNOSIS — E871 Hypo-osmolality and hyponatremia: Secondary | ICD-10-CM | POA: Diagnosis not present

## 2020-04-07 DIAGNOSIS — R0602 Shortness of breath: Secondary | ICD-10-CM

## 2020-04-07 DIAGNOSIS — R590 Localized enlarged lymph nodes: Secondary | ICD-10-CM | POA: Diagnosis not present

## 2020-04-07 DIAGNOSIS — N179 Acute kidney failure, unspecified: Secondary | ICD-10-CM | POA: Diagnosis not present

## 2020-04-07 DIAGNOSIS — I313 Pericardial effusion (noninflammatory): Secondary | ICD-10-CM | POA: Diagnosis not present

## 2020-04-07 DIAGNOSIS — Z515 Encounter for palliative care: Secondary | ICD-10-CM

## 2020-04-07 DIAGNOSIS — Z66 Do not resuscitate: Secondary | ICD-10-CM

## 2020-04-07 DIAGNOSIS — Z789 Other specified health status: Secondary | ICD-10-CM

## 2020-04-07 DIAGNOSIS — J432 Centrilobular emphysema: Secondary | ICD-10-CM | POA: Diagnosis not present

## 2020-04-07 DIAGNOSIS — Z7189 Other specified counseling: Secondary | ICD-10-CM

## 2020-04-07 LAB — BASIC METABOLIC PANEL
Anion gap: 8 (ref 5–15)
BUN: 15 mg/dL (ref 8–23)
CO2: 27 mmol/L (ref 22–32)
Calcium: 9 mg/dL (ref 8.9–10.3)
Chloride: 97 mmol/L — ABNORMAL LOW (ref 98–111)
Creatinine, Ser: 1.23 mg/dL (ref 0.61–1.24)
GFR, Estimated: >60 mL/min (ref >60)
Glucose, Bld: 109 mg/dL — ABNORMAL HIGH (ref 70–99)
Potassium: 4.4 mmol/L (ref 3.5–5.1)
Sodium: 132 mmol/L — ABNORMAL LOW (ref 135–145)

## 2020-04-07 MED ORDER — MORPHINE SULFATE ER 15 MG PO TBCR
15.0000 mg | EXTENDED_RELEASE_TABLET | Freq: Once | ORAL | Status: AC
Start: 2020-04-07 — End: 2020-04-07
  Administered 2020-04-07: 15 mg via ORAL
  Filled 2020-04-07: qty 1

## 2020-04-07 MED ORDER — MORPHINE SULFATE ER 15 MG PO TBCR
30.0000 mg | EXTENDED_RELEASE_TABLET | Freq: Two times a day (BID) | ORAL | Status: DC
Start: 2020-04-07 — End: 2020-04-08
  Administered 2020-04-07 – 2020-04-08 (×2): 30 mg via ORAL
  Filled 2020-04-07 (×2): qty 2

## 2020-04-07 MED ORDER — DEXTROSE-NACL 5-0.9 % IV SOLN: INTRAVENOUS | Status: DC

## 2020-04-07 NOTE — Evaluation (Signed)
Clinical/Bedside Swallow Evaluation Patient Details  Name: Corey Dixon MRN: [id number] Date of Birth: 14-Nov-1955  Today's Date: 04/07/2020 Time: SLP Start Time (ACUTE ONLY): 8003 SLP Stop Time (ACUTE ONLY): 1330 SLP Time Calculation (min) (ACUTE ONLY): 35 min  Past Medical History:  Past Medical History:  Diagnosis Date  . Bladder cancer ([place])   . COPD (chronic obstructive pulmonary disease) ([place])    Past Surgical History:  Past Surgical History:  Procedure Laterality Date  . BRONCHIAL BRUSHINGS  02/21/2020   Procedure: BRONCHIAL BRUSHINGS;  Surgeon: Rigoberto Noel, MD;  Location: [facility] ENDOSCOPY;  Service: Cardiopulmonary;;  . BRONCHIAL NEEDLE ASPIRATION BIOPSY  02/21/2020   Procedure: BRONCHIAL NEEDLE ASPIRATION BIOPSIES;  Surgeon: Rigoberto Noel, MD;  Location: [place];  Service: Cardiopulmonary;;  . BRONCHIAL WASHINGS  02/21/2020   Procedure: BRONCHIAL WASHINGS;  Surgeon: Rigoberto Noel, MD;  Location: [place];  Service: Cardiopulmonary;;  . ENDOBRONCHIAL ULTRASOUND N/A 02/21/2020   Procedure: ENDOBRONCHIAL ULTRASOUND;  Surgeon: Rigoberto Noel, MD;  Location: [place];  Service: Cardiopulmonary;  Laterality: N/A;  . LAPAROSCOPY  02/23/2020   Procedure: LAPAROSCOPY DIAGNOSTIC;  Surgeon: Clovis Riley, MD;  Location: [place];  Service: General;;  . LIVER BIOPSY  02/23/2020   Procedure: LIVER BIOPSY;  Surgeon: Clovis Riley, MD;  Location: [place];  Service: General;;  . RENAL ARTERY STENT  2009, 09/15/2016  . VIDEO BRONCHOSCOPY N/A 02/21/2020   Procedure: VIDEO BRONCHOSCOPY WITH FLUORO;  Surgeon: Rigoberto Noel, MD;  Location: [place];  Service: Cardiopulmonary;  Laterality: N/A;   HPI:  65 y.o. male presenting to [facility] ED on 04/04/2020 with symptoms of SOB and weakness, recently admitted for complications of lung cancer and pericarditis. Pt found to have AKI 2/2 dehydration. PMH includes bladder cancer, COPD and chronic pericardial effusion, COVID   Assessment / Plan  / Recommendation Clinical Impression  Patient presents with overt s/s aspiration and/or penetration with thin liquids sips with likely cause or at least significant impact from no evidence of vocal cord adduction. Patient is completely aphonic and is unable to achieve even a cough response. Chin tuck posture did not help reduce instances of coughing with thin liquids. Nectar thick liquids did not result in coughing, however patient is debating use of this as "it tastes nasty". Patient is to meet with Palliative NP to discuss [place] and SLP is available to return if patient desires to discuss options. We did start discussion on potential MBS to determine if patient is indeed aspirating or penetrating with thin liquids. MBS would be to provide patient more insight into his swallow function and perhaps provide some peace of mind as he is very concerned about potential for aspirating and if he would need to consider long term use of IV fluids. SLP Visit Diagnosis: Dysphagia, unspecified (R13.10)    Aspiration Risk  Mild aspiration risk;Moderate aspiration risk    Diet Recommendation Thin liquid;Regular;Nectar-thick liquid   Liquid Administration via: Cup;Straw Medication Administration: Whole meds with liquid Supervision: Patient able to self feed Postural Changes: Seated upright at 90 degrees    Other  Recommendations Oral Care Recommendations: Oral care BID   Follow up Recommendations None      Frequency and Duration min 1 x/week  1 week       Prognosis Prognosis for Safe Diet Advancement: Guarded Barriers to Reach Goals: Other (Comment) Barriers/Prognosis Comment: patient unsure of what interventions or tests he would like completed secondary to [place] with discharge home with hospice  Swallow Study   General Date of Onset: 04/04/20 HPI: 65 y.o. male presenting to [facility] ED on 04/04/2020 with symptoms of SOB and weakness, recently admitted for complications of lung cancer and pericarditis.  Pt found to have AKI 2/2 dehydration. PMH includes bladder cancer, COPD and chronic pericardial effusion, COVID Type of Study: Bedside Swallow Evaluation Previous Swallow Assessment: None found Diet Prior to this Study: Regular;Thin liquids Temperature Spikes Noted: No Respiratory Status: Room air History of Recent Intubation: No Behavior/Cognition: Alert;Cooperative;Pleasant mood Oral Cavity Assessment: Within Functional Limits Oral Care Completed by SLP: No Oral Cavity - Dentition: Adequate natural dentition Vision: Functional for self-feeding Self-Feeding Abilities: Able to feed self Patient Positioning: Upright in bed Baseline Vocal Quality: Aphonic Volitional Cough: Weak Volitional Swallow: Able to elicit    Oral/Motor/Sensory Function Overall Oral Motor/Sensory Function: Within functional limits   Ice Chips     Thin Liquid Thin Liquid: Impaired Presentation: Cup Pharyngeal  Phase Impairments: Cough - Immediate Other Comments: Patient exhibited cough response with thin liquids but is unable to achieve vocal cord adduction for adequate cough response, chin tuck posture did not help    Nectar Thick Nectar Thick Liquid: Within functional limits Presentation: Cup;Self Fed   Honey Thick     Puree Puree: Not tested   Solid     Solid: Not tested      Sonia Baller, MA, CCC-SLP Speech Therapy [facility]

## 2020-04-07 NOTE — Progress Notes (Signed)
Pt said he will wear CPAP by himself when he is ready to go to bed.

## 2020-04-07 NOTE — Consult Note (Signed)
Consultation Note Date: 04/07/2020   Patient Name: Corey Dixon  DOB: Feb 15, 1955  MRN: [id number]  Age / Sex: 65 y.o., male  PCP: Arman Bogus., MD Referring Physician: Tawni Millers  Reason for Consultation: Establishing goals of care, "progressive/ agressive urothelial cancer"  HPI/Patient Profile: 65 y.o. male  with past medical history of COPD, bladder cancer, recent COVID infection 01/2020, and recent diagnosis of metastatic lung cancer presented to [facility] ED on 04/04/20 from home with complaints of progressively worsening shortness of breath. He was transferred to [facility] and admitted for this hospitalization on 04/04/2020 with for hyponatremia and AKI due to dehydration in the setting of progressive metastatic urothelial cancer. Per notes, primary oncology states patient is not a candidate for chemotherapy.  Noted patient's recent hospitalization for sepsis due to pneumonia, complicated with pericarditis (03/13/20 - 03/22/20 at [facility]).  Clinical Assessment and Goals of Care: I have reviewed medical records including EPIC notes, labs, and imaging. Received report from primary RN - no acute concerns.   Went to visit patient at bedside - no family/visitors present. Patient was lying in bed awake, alert, oriented, and able to participate in conversation. No signs or non-verbal gestures of pain or discomfort noted. Patient is very easily short of breath - even during simple conversation. He states his shortness of breath has improved with interventions since admission - we discussed his recent MS contin increase - he feels ok enough to trial this new dose before making changes. His voice is soft. Noted coughing throughout visit as patient drank sips of water - Corey Dixon explains he has had swallowing issues for several weeks.  Met with patient  to discuss diagnosis, prognosis, GOC, EOL  wishes, disposition, and options.  I introduced Palliative Medicine as specialized medical care for people living with serious illness. It focuses on providing relief from the symptoms and stress of a serious illness. The goal is to improve quality of life for both the patient and the family.  We discussed a brief life review of the patient as well as functional and nutritional status. Corey Dixon is married and has 1 son that lives in [place]. Prior to hospitalization he was living in a private residence with his wife. Corey Dixon tells me that he was first diagnosed with bladder cancer in 2009, had recurrence in 2018, chemo treatments in 2019 but spread to one of his kidneys, had kidney removed and has had no other incidence until January of this year. In January 2022 Corey Dixon tells me that he began to have difficulty breathing. He explains his symptoms have only worsened over time. Therapeutic listening provided as patient reflects over the course of the last 3 months - patient describes events as "sudden." He states that 3 weeks ago he was told his cancer was treatable, but now it has progressed and unfortunately has been told it is now untreatable. He states that simple tasks that used to be easy, such as walking to the bathroom, standing, eating/drinking, chewing, talking, are  all difficult and require a great deal of energy. He endorses a very poor appetite - no N/V - has lost 35 pounds since January.  We discussed patient's current illness and what it means in the larger context of patient's on-going co-morbidities. Patient has a clear understanding of his current medical situation - he understands he is not a candidate for chemotherapy. Natural disease trajectory and expectations at EOL were discussed. I attempted to elicit values and goals of care important to the patient. The difference between aggressive medical intervention and comfort care was considered in light of the patient's goals of care.  We  discussed physical therapy services in context of his current situation - specifically focusing on if he would want to use what little energy he does have for the day doing therapy. After discussion, Corey Dixon is clear that he does not wish to pursue PT and would rather utilize his energy for ADLs and interacting with family. We also discussed MBS as previously discussed with SLP. Provided context that if evaluation was done, asked what he would want to do with information - understanding that it would not change any aspiration risks. Discussed alternative option of careful hand feeding and good oral hygiene. Patient seems to feel he would not want MBS study. Patient is emotional during discussion today - emotional support provided. Patient is clear, however, that he wants to pass "peacefully and quietly." He is agreeable for home hospice services. Provided space and opportunity for patient to explore his thoughts and feelings regarding current situation - he is understandably overwhelmed.  Advance directives, concepts specific to code status, artificial feeding and hydration, and rehospitalization were considered and discussed. Corey Dixon states he does have a Living Will and HCPOA - his wife/Corey Dixon is his designated HCPOA. Encouraged patient/family to consider DNR/DNI status understanding evidenced based poor outcomes in similar hospitalized patient, as the cause of arrest is likely associated with advanced chronic/terminal illness rather than an easily reversible acute cardio-pulmonary event. I explained that DNR/DNI does not change the medical plan and it only comes into effect after a person has arrested (died).  It is a protective measure to keep [place] from harming the patient in their last moments of life. Patient was agreeable to DNR/DNI with understanding that he would not receive CPR, defibrillation, ACLS medications, or intubation. He understands these interventions would not change his overall outcome  due to cancer diagnosis. Introduced, reviewed, answered all questions in detail, and completed MOST form as outlined under recommendation section below.   Visit also consisted of discussions dealing with the complex and emotionally intense issues of symptom management and palliative care in the setting of serious and potentially life-threatening illness. Palliative care team will continue to support patient, patient's family, and medical team.  After meeting with patient, met with sister/Joan in hallway. Reviewed similar information as outlined above that included: education on home hospice services, natural trajectory and expectations at EOL (especially around decreased appetite and PO intake). Therapeutic listening and emotional support provided as she reflected on the events of the last several months. She confirms that family want to use [facility] for hospice services.   Called patient's wife/Corey. Provided emotional support. Manuela Schwartz states she feels "in shock." She confirms wishes for [facility] and is ready for patient to return home once DME is delivered, which she said should be arriving tomorrow 3/14. Family are hopeful to get patient home as soon as possible.  Discussed with patient/family the  importance of continued conversation with each other and the medical providers regarding overall plan of care and treatment options, ensuring decisions are within the context of the patient's values and GOCs.    Questions and concerns were addressed. The patient/family was encouraged to call with questions and/or concerns. PMT card was provided.   Primary Decision Maker: PATIENT  Wife is HCPOA per patient    SUMMARY OF RECOMMENDATIONS    Continue current gentle medical treatment without escalation of care  Initiated DNR/DNI - durable DNR form completed and placed in shadow chart; copy was made and will be scanned into Vynca/ACP tab  Patient and family's goal is for  patient to discharge home with hospice - once DME delivered (should be tomorrow per wife) they are ready for patient's discharge  TOC consult placed for: home hospice referral - family requesting [facility]  Patient would benefit from counseling through hospice services  Patient does not wish to pursue PT or MBS   Noted MS Contin was increased today - will re-assess for symptom improvement - will not make further changes today  MOST form completed as follows: DNR, Comfort Measures, Determine use or limitation of antibiotics when infection occurs, No IVF, No feeding tube. Original form placed in shadow chart; copy made and will be scanned into Vynca/ACP tab  Obtain copies of Living Will and HCPOA if able (patient states wife is HCPOA)  PMT will continue to follow and support holistically  Code Status/Advance Care Planning:  DNR   Palliative Prophylaxis:   Aspiration, Bowel Regimen, Delirium Protocol, Frequent Pain Assessment, Oral Care and Turn Reposition  Additional Recommendations (Limitations, Scope, Preferences):  Avoid Hospitalization, Initiate Comfort Feeding, No Artificial Feeding, No Chemotherapy, No Diagnostics, No Hemodialysis, No Surgical Procedures and No Tracheostomy  Psycho-social/Spiritual:  Created space and opportunity for patient and family to express thoughts and feelings regarding patient's current medical situation.   Emotional support and therapeutic listening provided.  Prognosis:   < 4 weeks  Discharge Planning: Home with Hospice      Primary Diagnoses: Present on Admission: . COPD (chronic obstructive pulmonary disease) ([place]) . Pericardial effusion . AKI (acute kidney injury) ([place]) . Mediastinal lymphadenopathy   I have reviewed the medical record, interviewed the patient and family, and examined the patient. The following aspects are pertinent.  Past Medical History:  Diagnosis Date  . Bladder cancer ([place])   . COPD (chronic  obstructive pulmonary disease) ([facility])    Social History   Socioeconomic History  . Marital status: Married    Spouse name: Not on file  . Number of children: Not on file  . Years of education: Not on file  . Highest education level: Not on file  Occupational History  . Not on file  Tobacco Use  . Smoking status: Former Smoker    Types: E-cigarettes  . Smokeless tobacco: Never Used  Substance and Sexual Activity  . Alcohol use: Yes  . Drug use: Never  . Sexual activity: Not on file  Other Topics Concern  . Not on file  Social History Narrative  . Not on file   Social Determinants of Health   Financial Resource Strain: Not on file  Food Insecurity: Not on file  Transportation Needs: Not on file  Physical Activity: Not on file  Stress: Not on file  Social Connections: Not on file   No family history on file. Scheduled Meds: . colchicine  0.6 mg Oral Daily  . enoxaparin (LOVENOX) injection  40  mg Subcutaneous Q24H  . escitalopram  20 mg Oral Daily  . feeding supplement  237 mL Oral TID BM  . folic acid  1 mg Oral Daily  . melatonin  5 mg Oral QHS  . mometasone-formoterol  2 puff Inhalation BID  . morphine  30 mg Oral Q12H  . multivitamin with minerals  1 tablet Oral Daily  . pantoprazole  40 mg Oral Daily  . polyethylene glycol  17 g Oral BID  . predniSONE  10 mg Oral Daily  . sodium chloride flush  3 mL Intravenous Q12H  . tamsulosin  0.4 mg Oral Daily   Continuous Infusions: . sodium chloride    . dextrose 5 % and 0.9% NaCl 50 mL/hr at 04/07/20 1203   PRN Meds:.sodium chloride, acetaminophen, LORazepam, morphine injection, ondansetron (ZOFRAN) IV, oxyCODONE, sodium chloride flush Medications Prior to Admission:  Prior to Admission medications   Medication Sig Start Date End Date Taking? Authorizing Provider  acetaminophen (TYLENOL) 325 MG tablet Take 2 tablets (650 mg total) by mouth every 6 (six) hours as needed for mild pain, fever or headache. 02/25/20  Yes  Regalado, Belkys A, MD  albuterol (VENTOLIN HFA) 108 (90 Base) MCG/ACT inhaler Inhale 2 puffs into the lungs daily as needed for wheezing or shortness of breath. 11/11/15  Yes [provider]  colchicine 0.6 MG tablet Take 0.6 mg by mouth daily. 03/22/20  Yes [provider]  escitalopram (LEXAPRO) 20 MG tablet Take 20 mg by mouth daily. 02/03/20  Yes [provider]  Fluticasone-Salmeterol (ADVAIR) 100-50 MCG/DOSE AEPB Inhale 1 puff into the lungs 2 (two) times daily.   Yes [provider]  folic acid (FOLVITE) 1 MG tablet Take 1 tablet (1 mg total) by mouth daily. 02/26/20  Yes Regalado, Belkys A, MD  HYDROcodone-acetaminophen (NORCO) 10-325 MG tablet Take 1 tablet by mouth 4 (four) times daily as needed for severe pain. 03/22/20  Yes [provider]  LORazepam (ATIVAN) 0.5 MG tablet Take 0.5 mg by mouth every 6 (six) hours as needed for anxiety. 03/28/20 06/26/20 Yes [provider]  melatonin 5 MG TABS Take 1 tablet (5 mg total) by mouth at bedtime. 02/25/20  Yes Regalado, Belkys A, MD  morphine (MS CONTIN) 15 MG 12 hr tablet Take 15 mg by mouth 2 (two) times daily as needed for pain. 04/01/20  Yes [provider]  omeprazole (PRILOSEC OTC) 20 MG tablet Take 20 mg by mouth daily.   Yes [provider]  oxyCODONE (OXY IR/ROXICODONE) 5 MG immediate release tablet Take 5 mg by mouth every 6 (six) hours as needed for severe pain. 04/01/20 04/11/20 Yes [provider]  polyethylene glycol (MIRALAX / GLYCOLAX) 17 g packet Take 17 g by mouth 2 (two) times daily. 02/25/20  Yes Regalado, Belkys A, MD  predniSONE (DELTASONE) 10 MG tablet Take 10 mg by mouth daily. 03/25/20  Yes [provider]  tamsulosin (FLOMAX) 0.4 MG CAPS capsule Take 0.4 mg by mouth daily. 07/10/15  Yes [provider]  nicotine (NICODERM CQ - DOSED IN MG/24 HOURS) 14 mg/24hr patch Place 1 patch (14 mg total) onto the skin daily. Patient not taking: No  sig reported 02/26/20   Niel Hummer A, MD   Allergies  Allergen Reactions  . Penicillins     Unknown, pt was a child   Review of Systems  Constitutional: Positive for activity change, appetite change, fatigue and unexpected weight change.  HENT: Positive for trouble swallowing.  Respiratory: Positive for cough, chest tightness and shortness of breath.   Gastrointestinal: Negative for nausea and vomiting.  Neurological: Positive for weakness.    Physical Exam Vitals and nursing note reviewed.  Constitutional:      General: He is not in acute distress.    Appearance: He is ill-appearing.  Pulmonary:     Effort: No respiratory distress.     Comments: Shortness of breath Skin:    General: Skin is warm and dry.  Neurological:     Mental Status: He is alert and oriented to person, place, and time.     Motor: Weakness present.  Psychiatric:        Attention and Perception: Attention normal.        Behavior: Behavior is cooperative.        Cognition and Memory: Cognition and memory normal.     Vital Signs: BP 113/73 (BP Location: Left Arm)   Pulse 88   Temp (!) 97.4 F (36.3 C) (Oral)   Resp 17   Ht 6' (1.829 m)   Wt 73.2 kg   SpO2 93%   BMI 21.89 kg/m  Pain Scale: 0-10   Pain Score: 6    SpO2: SpO2: 93 % O2 Device:SpO2: 93 % O2 Flow Rate: .O2 Flow Rate (L/min): (S) 6 L/min  IO: Intake/output summary:   Intake/Output Summary (Last 24 hours) at 04/07/2020 1303 Last data filed at 04/07/2020 9528 Gross per 24 hour  Intake 1578.15 ml  Output 1075 ml  Net 503.15 ml    LBM: Last BM Date: 04/01/20 Baseline Weight: Weight: 70.8 kg Most recent weight: Weight: 73.2 kg     Palliative Assessment/Data: PPS 20%     Time In: 1315 Time Out: 1500 Time Total: 105 minutes  Greater than 50%  of this time was spent counseling and coordinating care related to the above assessment and plan.  Signed by: Lin Landsman, NP   Please contact Palliative Medicine Team  phone at [phone] for questions and concerns.  For individual provider: See Shea Evans

## 2020-04-07 NOTE — Progress Notes (Signed)
Occupational Therapy Evaluation  Patient lives with spouse in single level house with level entry. Prior to hospitalization was independent with self care tasks without AD. Currently patient having increased shortness of breath and weakness, with metastatic lesions to ribs and lungs. Patient only able to speak in hoarse whisper. Patient educated on role of OT, pt verbalizes wanting to maintain independence as he can at home "so it's not all on my wife." Provide recommendations re: home DME for energy conservation, as well as energy conservation handout, pt verbalize understanding. Patient did not require any physical assistance for functional mobility/transfer with rolling walker, does fatigue quickly and desat to 86% on RA. Pt stating the "oxygen doesn't help me either way," and with ~30 seconds rest in bed recovers to 90-91%. Patient expresses he wants to D/C home with hospice. All education re: energy conservation for home ADLs completed, no further acute OT needs at this time. Will discontinue acute OT services, please re-consult if new needs arise.    04/07/20 1300  OT Visit Information  Last OT Received On 04/07/20  Assistance Needed +1  History of Present Illness 65 y.o. male presenting to [facility] ED on 04/04/2020 with symptoms of SOB and weakness, recently admitted for complications of lung cancer and pericarditis. Pt found to have AKI 2/2 dehydration. PMH includes bladder cancer, COPD and chronic pericardial effusion, COVID.  Precautions  Precautions Fall  Restrictions  Weight Bearing Restrictions No  Home Living  Family/patient expects to be discharged to: Private residence  Living Arrangements Spouse/significant other  Available Help at Discharge Family;Available 24 hours/day  Type of Home House  Home Access Level entry  Home Layout One level  Bathroom Shower/Tub Walk-in Engineer, materials None  Prior Function  Level of Independence Independent   Communication  Communication Other (comment) (hoarse/whispered speech)  Pain Assessment  Pain Assessment Faces  Faces Pain Scale 2  Pain Location generalized  Pain Descriptors / Indicators Grimacing  Pain Intervention(s) Monitored during session  Cognition  Arousal/Alertness Awake/alert  Behavior During Therapy WFL for tasks assessed/performed  Overall Cognitive Status Within Functional Limits for tasks assessed  Upper Extremity Assessment  Upper Extremity Assessment Generalized weakness  Lower Extremity Assessment  Lower Extremity Assessment Defer to PT evaluation  Cervical / Trunk Assessment  Cervical / Trunk Assessment Normal  ADL  Overall ADL's  Modified independent  General ADL Comments patient demonstrates ability to perform LB dressing, functional transfer and ambulation with rolling walker without physical assistance. Patient limitation is activity tolerance 2* metastatic disease affecting lungs. Patient desat to 85% on RA post activity pt states the oxygen "doesn't help me either way" and recovers to 91% in bed after ~30 seconds. educated patient on energy conservation strategies including recommendations for home DME, patient verbalize understanding. Also provided patient with hand out.  Bed Mobility  Overal bed mobility Modified Independent  General bed mobility comments increased time, HOB elevated  Transfers  Overall transfer level Modified independent  Equipment used Rolling walker (2 wheeled)  Transfers Sit to/from Stand  Sit to Stand Modified independent (Device/Increase time)  Balance  Overall balance assessment Needs assistance  Sitting-balance support No upper extremity supported;Feet supported  Sitting balance-Leahy Scale Good  Standing balance support No upper extremity supported  Standing balance-Leahy Scale Fair  Standing balance comment static stand without UE support  OT - End of Session  Equipment Utilized During Treatment Rolling walker  Activity  Tolerance Patient limited by fatigue  Patient left in bed;with  call bell/phone within reach;with bed alarm set  Nurse Communication Mobility status  OT Assessment  OT Recommendation/Assessment Patient does not need any further OT services  OT Visit Diagnosis Muscle weakness (generalized) (M62.81)  OT Problem List Decreased activity tolerance;Impaired balance (sitting and/or standing);Decreased strength  AM-PAC OT "6 Clicks" Daily Activity Outcome Measure (Version 2)  Help from another person eating meals? 4  Help from another person taking care of personal grooming? 4  Help from another person toileting, which includes using toliet, bedpan, or urinal? 4  Help from another person bathing (including washing, rinsing, drying)? 4  Help from another person to put on and taking off regular upper body clothing? 4  Help from another person to put on and taking off regular lower body clothing? 4  6 Click Score 24  OT Recommendation  Follow Up Recommendations No OT follow up;Other (comment);Supervision/Assistance - 24 hour (patient reports he is D/C home with hospice services)  OT Equipment 3 in 1 bedside commode;Wheelchair (measurements OT);Other (comment) (rollator)  Acute Rehab OT Goals  Patient Stated Goal to maintain independence without overexerting  OT Goal Formulation All assessment and education complete, DC therapy  OT Time Calculation  OT Start Time (ACUTE ONLY) 1158  OT Stop Time (ACUTE ONLY) 1217  OT Time Calculation (min) 19 min  OT General Charges  $OT Visit 1 Visit  OT Evaluation  $OT Eval Low Complexity 1 Low  Written Expression  Dominant Hand Right   Delbert Phenix OT OT pager: [phone]

## 2020-04-07 NOTE — Plan of Care (Signed)

## 2020-04-07 NOTE — Progress Notes (Addendum)
PROGRESS NOTE    Corey Dixon  OEV:[id number] DOB: November 13, 1955 DOA: 04/04/2020 PCP: Arman Bogus., MD    Brief Narrative:  Mr. Corey Dixon was admitted to the hospital withHyponatremia and AKI due to dehydration, in the setting of progressive metastatic urothelial cancer.  65 year old male past medical history for bladder cancer, COPD and chronic pericardial effusion, who presented with dyspnea. He had a recent hospitalization for sepsis due topneumonia, complicated with pericarditis (03/13/20 to 03/22/20 [facility]), discharged on colchicine and prednisone. COVID 19 in 01/2020 At home patient hadnew onset ofworsening dyspnea especially on exertion, prompting him to come back to the hospital.  After his discharge from [facility], he continue to be very weak and deconditioned but able to do home PT, over last 48 hrs before hospitalization he had decrease po intake and PT noted him to be more weak. On his initial physical examination his temperature was 98.1, blood pressure 94/76, heart rate 103, respiratory rate 20, oxygen saturation 92%. His lungs were clear to auscultation bilaterally, heart S1-S2, present, tachycardic, abdomen soft nontender, no lower extremity edema.  Sodium 124, potassium 4.9, chloride 87, bicarb 24, glucose 144, BUN 32, creatinine 1.38, BNP 615, white count 12.8, hemoglobin 9.3, hematocrit 33.1, platelets 253. SARS COVID-19 negative.  Chest radiograph with hyperinflation, right hemidiaphragm elevation, bilateral nodules. Chest CT with no evidence of pulmonary embolism, increase in size and number of bilateral pulmonary nodules consistent with worsening metastatic disease. Left upper lobe cavitation 2.2 x 2.9 cm. Extensive mediastinal and hilar adenopathy. Diffuse emphysema.  Lytic lesion on the right fifth rib concerning for osseous metastatic disease, pathologic fracture involving the right coracoid with extension into the inferior glenoid articular surface.   EKG  104 bpm, normal axis, normal intervals, sinus rhythm, no ST segment or T wave changes.  Patient placed on IV fluids with improvement of renal function and  Hyponatremia.  Progressive and aggressive malignancy, not candidate for chemotherapy per primary oncology. Consulted Palliative Care, plan for discharge with hospice services when medically stable.    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 Acute hyponatremia and AKI on CKD stage 3a, due to dehydration.  CT upper abdomen showed left kidney unremarkable. Note prior right nephrectomy.  Serum Na is up to 132, renal function with serum cr at 1,23 with K at 4,4 and bicarbonate at 27. Will consult speech for swallow evaluation. Decreased rate of IV fluids for today and check renal panel in am.   2. Chronic pericardial effusion/ pericarditis. Follow up echocardiogram with moderate pericardial effusion, unchanged from prior exams. Located anterior to the right ventricle and surrounding the apex. No tamponade.  LV Ef 55 to 60 %.   Tolerating well colchicine and prednisone. No chest pain.    3. 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 Acute hypoxemic respiratory failure.  CT chest with no pulmonary embolism. No wheezing or increase sputum production.  No clinical signs of decompensation.   Intermittent use of oxygen and cpap for dyspnea. He has a very low lung reserve related to emphysema and lung metastasis. No signs of COPD exacerbation. Continue with bronchodilator therapy and oxymetry monitoring.   For persistent dyspnea will increase MS contin to 30 mg po bid.    4.Bladder cancer status post  TURP x2, urothelial cancer status post rightnephro-uretheroctomy, with  metastasis to bone, liver and lung.Pathologic fractures 5th right rib and right shoulder.  Malignancy seems to be progressive and aggressive, currently with very poor functional status. Primary oncology has been in contact with patient's wife, patient with advance disease and not candidate for chemotherapy.   Plan for discharge home with home hospice, for persistent dyspnea will increase MS contin to 30 mg po bid, continue pain control with as needed oxycodone and lorazepam for anxiety.   Patient will need home 02 and wheelchair at discharge.   5. Depression. Continue with  escitalopram. PRN lorazepam.  6. Anemia of chronic disease. Stable hgb at 9,7 and hct at 28.8   7. Mild calorie protein malnutrition. Continue with oral supplements.    Status is: Inpatient  Remains inpatient appropriate because:Inpatient level of care appropriate due to severity of illness   Dispo: The patient is from: Home              Anticipated d/c is to: Home              Patient currently is not medically stable to d/c.   Difficult to place patient No   DVT prophylaxis: Enoxaparin   Code Status:   full  Family Communication:   I spoke over the phone with the patient's wife about patient's  condition, plan of care, prognosis and all questions were addressed.    Nutrition Status: Nutrition Problem: Inadequate oral intake Etiology: decreased appetite Signs/Symptoms: meal completion < 25%,per patient/family report Interventions: Ensure Enlive (each supplement provides 350kcal and 20 grams of protein),MVI     Skin Documentation:     Consultants:   Palliative care   Subjective: Patient continue to be very weak and deconditioned, today with difficulty swallowing, no nausea or vomiting. Persistent dyspnea at rest and worse with movement, no cough or wheezing.   Objective: Vitals:   04/06/20 1949 04/07/20 0049 04/07/20 0434 04/07/20 0815  BP: 107/66 122/70 117/73 113/73  Pulse: 88      Resp: 18 18 17    Temp: 97.6 F (36.4 C) 97.9 F (36.6 C) 97.9 F (36.6 C) (!) 97.4 F (36.3 C)  TempSrc: Oral Oral Oral Oral  SpO2: 96% 92% 91% 93%  Weight:   73.2 kg   Height:        Intake/Output Summary (Last 24 hours) at 04/07/2020 1003 Last data filed at 04/07/2020 0900 Gross per 24 hour  Intake 2046.4 ml  Output 1375 ml  Net 671.4 ml   Filed Weights   04/05/20 0500 04/06/20 0431 04/07/20 0434  Weight: 71.4 kg 71.5 kg 73.2 kg    Examination:   General: deconditioned and ill looking appearing  Neurology: Awake and alert, non focal  E ENT: positive  pallor, no icterus, oral mucosa moist Cardiovascular: No JVD. S1-S2 present, rhythmic, no gallops, rubs, or murmurs. No lower extremity edema. Pulmonary: positive breath sounds bilaterally, decreased air movement, no wheezing, rhonchi or rales. Gastrointestinal. Abdomen soft and non tender Skin. No rashes Musculoskeletal: no joint deformities     Data Reviewed: I have personally reviewed following labs and imaging studies  CBC: Recent Labs  Lab 04/04/20 1100 04/04/20 1922 04/06/20 0431  WBC 12.8* 12.5* 10.8*  NEUTROABS 10.9*  --  8.9*  HGB 11.3* 11.1* 9.7*  HCT 33.1* 32.6* 28.8*  MCV 82.1 82.3 83.0  PLT 253 245 466   Basic Metabolic Panel: Recent Labs  Lab 04/04/20 1100 04/04/20 1922 04/05/20 0311 04/06/20 0431  04/07/20 0527  NA 124*  --  124* 128* 132*  K 4.9  --  4.7 4.2 4.4  CL 87*  --  87* 93* 97*  CO2 24  --  25 25 27   GLUCOSE 144*  --  96 130* 109*  BUN 32*  --  29* 23 15  CREATININE 1.38* 1.57* 1.71* 1.46* 1.23  CALCIUM 9.0  --  9.3 9.0 9.0   GFR: Estimated Creatinine Clearance: 61.2 mL/min (by C-G formula based on SCr of 1.23 mg/dL). Liver Function Tests: No results for input(s): AST, ALT, ALKPHOS, BILITOT, PROT, ALBUMIN in the last 168 hours. No results for input(s): LIPASE, AMYLASE in the last 168 hours. No results for input(s): AMMONIA in the last 168 hours. Coagulation Profile: No  results for input(s): INR, PROTIME in the last 168 hours. Cardiac Enzymes: No results for input(s): CKTOTAL, CKMB, CKMBINDEX, TROPONINI in the last 168 hours. BNP (last 3 results) No results for input(s): PROBNP in the last 8760 hours. HbA1C: No results for input(s): HGBA1C in the last 72 hours. CBG: No results for input(s): GLUCAP in the last 168 hours. Lipid Profile: No results for input(s): CHOL, HDL, LDLCALC, TRIG, CHOLHDL, LDLDIRECT in the last 72 hours. Thyroid Function Tests: No results for input(s): TSH, T4TOTAL, FREET4, T3FREE, THYROIDAB in the last 72 hours. Anemia Panel: No results for input(s): VITAMINB12, FOLATE, FERRITIN, TIBC, IRON, RETICCTPCT in the last 72 hours.    Radiology Studies: I have reviewed all of the imaging during this hospital visit personally     Scheduled Meds: . colchicine  0.6 mg Oral Daily  . enoxaparin (LOVENOX) injection  40 mg Subcutaneous Q24H  . escitalopram  20 mg Oral Daily  . feeding supplement  237 mL Oral TID BM  . folic acid  1 mg Oral Daily  . melatonin  5 mg Oral QHS  . mometasone-formoterol  2 puff Inhalation BID  . morphine  15 mg Oral Once  . morphine  30 mg Oral Q12H  . multivitamin with minerals  1 tablet Oral Daily  . pantoprazole  40 mg Oral Daily  . polyethylene glycol  17 g Oral BID  . predniSONE  10 mg Oral Daily  . sodium chloride flush  3 mL Intravenous Q12H  . tamsulosin  0.4 mg Oral Daily   Continuous Infusions: . sodium chloride       LOS: 3 days        Christhoper Busbee Gerome Apley, MD

## 2020-04-07 NOTE — Progress Notes (Signed)
  Speech Language Pathology Treatment: Dysphagia  Patient Details Name: Corey Dixon MRN: [id number] DOB: [birth year] Today's Date: 04/07/2020 Time: 4970-2637 SLP Time Calculation (min) (ACUTE ONLY): 10 min  Assessment / Plan / Recommendation Clinical Impression  Patient seen to address dysphagia goals and provide further education. Patient's wife present in room during this session. Patient's main concerns were regarding the short and long term effects of potential aspiration or liquids. SLP discussed importance of good oral care to reduce bacteria in mouth which are the main cause of aspiration PNA rather than the liquids themselves. Educated patient further that main concern with aspiration is not his lungs filling up with fluid, but potential for bacteria to travel with liquids into airway and causing infection. Patient asked about what benefit MBS would be and SLP discussed that potential benefit would be for peace of mind for him as he is quite anxious about aspiration when he is drinking thin liquids and that we dont definitively know if he is aspirating or penetrating. Patient understanding that aside from providing him with information about his swallow function and perhaps some strategies to reduce dysphagia symptoms (we had tried chin tuck posture at bedside and did not seem to reduce incidence of coughing), MBS would not result in changing his current swallow function. SLP also recommended that patient periodically check temperature and educated him on how a temperature without other symptoms could indicate possible aspiration PNA.Patient does not wish to pursue MBS at this time as he wants to see if there are some positive changes in his swallow function in the next 48 hours or so. SLP is not recommending [place] SLP f/u however encouraged patient to discuss further with his home hospice care team if he wishes.   HPI HPI: 65 y.o. male presenting to [facility] ED on 04/04/2020 with symptoms of SOB and  weakness, recently admitted for complications of lung cancer and pericarditis. Pt found to have AKI 2/2 dehydration. PMH includes bladder cancer, COPD and chronic pericardial effusion, COVID      SLP Plan  Other (Comment) (will keep patient on caseload in case he has other questions or changes mind about MBS)       Recommendations  Medication Administration: Whole meds with liquid                Oral Care Recommendations: Oral care BID Follow up Recommendations: None SLP Visit Diagnosis: Dysphagia, unspecified (R13.10) Plan: Other (Comment) (will keep patient on caseload in case he has other questions or changes mind about MBS)       Ferguson, MA, CCC-SLP Speech Therapy [facility] Acute Rehab

## 2020-04-07 NOTE — TOC Progression Note (Signed)
Transition of Care [facility]) - Progression Note    Patient Details  Name: Corey Dixon MRN: [id number] Date of Birth: 1956/01/17  Transition of Care [facility]) CM/SW Contact  Graves-Bigelow, Ocie Cornfield, RN Phone Number: 04/07/2020, 3:14 PM  Clinical Narrative: Case Manager was asked by Physician to set the patient up with home hospice services. Medicare.gov agency list provided and patient asked Case Manager to call the wife for choice. Case Manager spoke with wife and she has chosen [facility]. Case Manager made the referral with [facility] and durable medical equipment will be ordered and delivered to the home (wheelchair, hospital bed & oxygen). Case Manager will continue to follow for additional toc needs.    Expected Discharge Plan: Home w Hospice Care Barriers to Discharge: No Barriers Identified  Expected Discharge Plan and Services Expected Discharge Plan: [place] In-house Referral: Hospice / Palliative Care Discharge Planning Services: CM Consult Post Acute Care Choice: Hospice Living arrangements for the past 2 months: Single Family Home                 DME Arranged: N/A DME Agency: NA (Hospice to arrange for WC, HB and 02.)       HH Arranged: RN [place] Date [place]: 04/07/20 Time [place]: 38 Representative spoke with at [place]: [place]  Readmission Risk Interventions No flowsheet data found.

## 2020-04-07 NOTE — Evaluation (Signed)
Physical Therapy Evaluation Patient Details Name: Corey Dixon MRN: [id number] DOB: Jun 26, 1955 Today's Date: 04/07/2020   History of Present Illness  65 y.o. male presenting to [facility] ED on 04/04/2020 with symptoms of SOB and weakness, recently admitted for complications of lung cancer and pericarditis. Pt found to have AKI 2/2 dehydration. PMH includes bladder cancer, COPD and chronic pericardial effusion, COVID.  Clinical Impression  Pt presents to PT with significant deficits in activity tolerance, as well as deficits in gait, balance, strength, power. PT provides education on use of 4 wheeled walker with seat to allow for improved energy conservation and UE support to reduce falls risk when mobilizing. PT provides further education on energy conservation measures, recommending wheelchair for community mobility or longer household distances. Pt will benefit froma 3in1 commode to reduce falls risk with urgent toileting needs. PT encourages transitioning the patients thinking of exercise as repetitions of LE/UE mobility and rather participating in functional exercise during the day incorporated into regular daily routines to avoid overexertion. Per chart review patient is discharging home with hospice services.    Follow Up Recommendations No PT follow up (per notes pt transitioning home with hospice)    Equipment Recommendations  Wheelchair (measurements PT);Other (comment);3in1 (PT) (4 wheeled walker with seat and brakes)    Recommendations for Other Services       Precautions / Restrictions Precautions Precautions: Fall Restrictions Weight Bearing Restrictions: No      Mobility  Bed Mobility Overal bed mobility: Modified Independent             General bed mobility comments: increased time, HOB elevated    Transfers Overall transfer level: Needs assistance Equipment used: 4-wheeled walker Transfers: Sit to/from Stand Sit to Stand: Supervision             Ambulation/Gait Ambulation/Gait assistance: Supervision Gait Distance (Feet): 40 Feet Assistive device: 4-wheeled walker Gait Pattern/deviations: Step-to pattern Gait velocity: reduced Gait velocity interpretation: <1.8 ft/sec, indicate of risk for recurrent falls General Gait Details: pt with slowed step-to gait  Stairs            Wheelchair Mobility    Modified Rankin (Stroke Patients Only)       Balance Overall balance assessment: Needs assistance Sitting-balance support: No upper extremity supported;Feet supported Sitting balance-Leahy Scale: Good     Standing balance support: No upper extremity supported Standing balance-Leahy Scale: Fair                               Pertinent Vitals/Pain Pain Assessment: Faces Faces Pain Scale: Hurts a little bit Pain Location: generalized Pain Descriptors / Indicators: Grimacing Pain Intervention(s): Monitored during session    Home Living Family/patient expects to be discharged to:: Private residence Living Arrangements: Spouse/significant other Available Help at Discharge: Family;Available 24 hours/day Type of Home: House Home Access: Level entry     Home Layout: One level Home Equipment: None      Prior Function Level of Independence: Independent               Hand Dominance        Extremity/Trunk Assessment   Upper Extremity Assessment Upper Extremity Assessment: Generalized weakness    Lower Extremity Assessment Lower Extremity Assessment: Generalized weakness    Cervical / Trunk Assessment Cervical / Trunk Assessment: Normal  Communication   Communication: No difficulties (communication limited by SOB)  Cognition Arousal/Alertness: Awake/alert Behavior During Therapy: WFL for tasks assessed/performed Overall  Cognitive Status: Within Functional Limits for tasks assessed                                        General Comments General comments (skin  integrity, edema, etc.): VSS with limited mobility, pt does report SOB and fatigues quickly. Pt mobilizing on RA    Exercises     Assessment/Plan    PT Assessment Patient needs continued PT services  PT Problem List Decreased activity tolerance;Decreased strength;Decreased balance;Decreased mobility;Cardiopulmonary status limiting activity       PT Treatment Interventions Gait training;DME instruction;Functional mobility training;Therapeutic activities;Therapeutic exercise;Balance training;Patient/family education    PT Goals (Current goals can be found in the Care Plan section)  Acute Rehab PT Goals Patient Stated Goal: to maintain independence without overexerting PT Goal Formulation: With patient Time For Goal Achievement: 04/21/20 Potential to Achieve Goals: Fair Additional Goals Additional Goal #1: Pt will report DOE of 4/10 or less when ambulating household distances of 20' or greater.    Frequency Min 2X/week   Barriers to discharge        Co-evaluation               AM-PAC PT "6 Clicks" Mobility  Outcome Measure Help needed turning from your back to your side while in a flat bed without using bedrails?: None Help needed moving from lying on your back to sitting on the side of a flat bed without using bedrails?: None Help needed moving to and from a bed to a chair (including a wheelchair)?: A Little Help needed standing up from a chair using your arms (e.g., wheelchair or bedside chair)?: A Little Help needed to walk in hospital room?: A Little Help needed climbing 3-5 steps with a railing? : A Lot 6 Click Score: 19    End of Session   Activity Tolerance: Patient limited by fatigue Patient left: in bed;with call bell/phone within reach Nurse Communication: Mobility status PT Visit Diagnosis: Other abnormalities of gait and mobility (R26.89);Other (comment) (cardiopulmonary endurance)    Time: 6553-7482 PT Time Calculation (min) (ACUTE ONLY): 34  min   Charges:   PT Evaluation $PT Eval Moderate Complexity: 1 Mod PT Treatments $Self Care/Home Management: 8-22        Zenaida Niece, PT, DPT Acute Rehabilitation Pager: [phone]   Zenaida Niece 04/07/2020, 12:49 PM

## 2020-04-08 DIAGNOSIS — J432 Centrilobular emphysema: Secondary | ICD-10-CM | POA: Diagnosis not present

## 2020-04-08 DIAGNOSIS — R590 Localized enlarged lymph nodes: Secondary | ICD-10-CM | POA: Diagnosis not present

## 2020-04-08 DIAGNOSIS — E871 Hypo-osmolality and hyponatremia: Secondary | ICD-10-CM | POA: Diagnosis not present

## 2020-04-08 DIAGNOSIS — N179 Acute kidney failure, unspecified: Secondary | ICD-10-CM | POA: Diagnosis not present

## 2020-04-08 LAB — BASIC METABOLIC PANEL
Anion gap: 9 (ref 5–15)
BUN: 14 mg/dL (ref 8–23)
CO2: 28 mmol/L (ref 22–32)
Calcium: 9.2 mg/dL (ref 8.9–10.3)
Chloride: 95 mmol/L — ABNORMAL LOW (ref 98–111)
Creatinine, Ser: 1.21 mg/dL (ref 0.61–1.24)
GFR, Estimated: >60 mL/min (ref >60)
Glucose, Bld: 111 mg/dL — ABNORMAL HIGH (ref 70–99)
Potassium: 4.3 mmol/L (ref 3.5–5.1)
Sodium: 132 mmol/L — ABNORMAL LOW (ref 135–145)

## 2020-04-08 MED ORDER — ENSURE ENLIVE PO LIQD: 237.0000 mL | Freq: Three times a day (TID) | ORAL | 0 refills | Status: AC

## 2020-04-08 MED ORDER — MORPHINE SULFATE ER 30 MG PO TBCR: 30.0000 mg | EXTENDED_RELEASE_TABLET | Freq: Two times a day (BID) | ORAL | 0 refills | Status: AC

## 2020-04-08 MED ORDER — ADULT MULTIVITAMIN W/MINERALS CH: 1.0000 | ORAL_TABLET | Freq: Every day | ORAL | 0 refills | Status: AC

## 2020-04-08 NOTE — TOC Progression Note (Addendum)
Transition of Care [facility]) - Progression Note    Patient Details  Name: Corey Dixon MRN: [id number] Date of Birth: [birth year]  Transition of Care [facility]) CM/SW Contact  Zenon Mayo, RN Phone Number: 04/08/2020, 9:41 AM  Clinical Narrative:    NCM received call from [facility] with [employer] stating they can not provide the rollator because looks like he is going home with hospice.  NCM confirmed that yes this was decided on over the weekend, so [place] should be the one supplying his DME.     Expected Discharge Plan: Home w Hospice Care Barriers to Discharge: No Barriers Identified  Expected Discharge Plan and Services Expected Discharge Plan: [place] In-house Referral: Hospice / Palliative Care Discharge Planning Services: CM Consult Post Acute Care Choice: Hospice Living arrangements for the past 2 months: Single Family Home                 DME Arranged: N/A DME Agency: NA (Hospice to arrange for WC, HB and 02.)       HH Arranged: RN [place] Date [place]: 04/07/20 Time [place]: 44 Representative spoke with at [place]: [place] ([place]) Interventions    Readmission Risk Interventions No flowsheet data found.

## 2020-04-08 NOTE — Discharge Summary (Signed)
Physician Discharge Summary  Corey Dixon TGG:[id number] DOB: September 26, 1955 DOA: 04/04/2020  PCP: Arman Bogus., MD  Admit date: 04/04/2020 Discharge date: 04/08/2020  Admitted From: Home  Disposition:  Home with home hospice   Recommendations for Outpatient Follow-up and new medication changes:  1. Follow up with Dr. Quay Burow in 7 days.  2. Morphine has been increased to 30 mg bid 3. Continue care under hospice, patient not candidate for cancer specific therapy.   Home Health: hospice  Equipment/Devices:  Home 02, wheelchair and hospital bed.   Discharge Condition: stable  CODE STATUS: DNR   Diet recommendation:  Regular as tolerated.   Brief/Interim Summary: Mr. Haithcock was admitted to the hospital withHyponatremia and AKI due to dehydration, in the setting of progressive metastatic urothelial cancer.  65 year old male past medical history for bladder cancer, COPD and chronic pericardial effusion, who presented with dyspnea. He had a recent hospitalization for sepsis due topneumonia, complicated with pericarditis (03/13/20 to 03/22/20 [facility]), discharged on colchicine and prednisone. COVID 19 in 01/2020 At home patient hadnew onset ofworsening dyspnea especially on exertion, prompting him to come back to the hospital.  After his discharge from [facility], he continue to be very weak and deconditioned but able to do home PT, over last 48 hrs before hospitalization he had decrease po intake and PT noted him to be more weak. On his initial physical examination his temperature was 98.1, blood pressure 94/76, heart rate 103, respiratory rate 20, oxygen saturation 92%. His lungs were clear to auscultation bilaterally, heart S1-S2, present, tachycardic, abdomen soft nontender, no lower extremity edema.  Sodium 124, potassium 4.9, chloride 87, bicarb 24, glucose 144, BUN 32, creatinine 1.38, BNP 615, white count 12.8, hemoglobin 9.3, hematocrit 33.1, platelets 253. SARS COVID-19  negative.  Chest radiograph with hyperinflation, right hemidiaphragm elevation, bilateral nodules. Chest CT with no evidence of pulmonary embolism, increase in size and number of bilateral pulmonary nodules consistent with worsening metastatic disease. Left upper lobe cavitation 2.2 x 2.9 cm. Extensive mediastinal and hilar adenopathy. Diffuse emphysema.  Lytic lesion on the right fifth rib concerning for osseous metastatic disease, pathologic fracture involving the right coracoid with extension into the inferior glenoid articular surface.   EKG 104 bpm, normal axis, normal intervals, sinus rhythm, no ST segment or T wave changes.  Patient placed on IV fluids with improvement of renal function and Hyponatremia.  Progressive and aggressive malignancy, not candidate for chemotherapy per primary oncology. Consulted Palliative Care, plan for discharge with hospice services when medically stable.  1. Acute hyponatremia and acute kidney injury on chronic kidney disease stage 3a due to dehydration. Patient was admitted to the medical ward, he received intravenous fluids with isotonic saline with improvement of kidney function and electrolytes.  His p.o. intake continued to be poor, he was evaluated by speech therapy, with recommendations to continue regular diet.  Patient had a recent CT of the upper abdomen which showed left kidney unremarkable, is status post right nephrectomy.  At discharge sodium 132, potassium 4.3, chloride 95, bicarb 28, glucose 111, BUN 14, creatinine 1.21.  2.  Chronic pericardial effusion/pericarditis.  Patient had a follow-up echocardiogram which showed moderate pericardial effusion, unchanged from prior exams.  Located anterior to the right ventricle and surrounding the apex, no tamponade, left ventricle ejection fraction 55 to 60%.  Patient will continue taking colchicine and prednisone.  Patient remained chest pain-free.  3.  COPD/emphysema with left upper  lobe cavitation, acute hypoxemic respiratory failure.  CT chest with no  pulmonary embolism, positive emphysema and worsening metastatic lung disease.  He had no signs of acute COPD exacerbation. He was placed on supplemental oxygen per nasal cannula with good toleration.  Continue bronchodilator therapy/ inhaled steroids and home oxygen. His dyspnea probably is multifactorial including deconditioning, advanced metastatic disease and recent COVID-19 viral infection. Morphine was increased to 30 mg twice daily.  4.  Bladder cancer status post TURP x2, urothelial cancer status post right nephro- uretheroctomy, with metastasis to the bone, liver and lung.  Pathologic fracture right fifth rib and right shoulder. Aggressive and progressive malignancy, poor functional status.  Primary oncology has been contacted by his wife, is not further candidate for chemotherapy.  Recommendations for hospice.  Patient will continue lorazepam and oxycodone as needed, his long-acting morphine has been increased to 30 g twice daily.  Continue further care under hospice.  5.  Depression.  Continue citalopram.  6.  Anemia of chronic disease, hemoglobin/hematocrit remained stable, no current indication for PRBC transfusion.  7.  Mild calorie protein malnutrition.  Continue nutritional supplements.    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   albuterol 108 (90 Base) MCG/ACT inhaler Commonly known as: VENTOLIN HFA Inhale 2 puffs into the lungs daily as needed for wheezing or shortness of breath.   colchicine 0.6 MG tablet Take 0.6 mg by mouth daily.   escitalopram 20 MG tablet Commonly known as: LEXAPRO Take 20 mg by mouth daily.   feeding supplement Liqd Take 237 mLs by mouth 3 (three) times daily between meals.   Fluticasone-Salmeterol 100-50 MCG/DOSE Aepb Commonly known as: ADVAIR Inhale 1 puff into the lungs 2 (two) times daily.   folic acid 1 MG tablet Commonly known as: FOLVITE Take 1 tablet (1 mg total) by mouth daily.   LORazepam 0.5 MG tablet Commonly known as: ATIVAN Take 0.5 mg by mouth every 6 (six) hours as needed for anxiety.   melatonin 5 MG Tabs Take 1 tablet (5 mg total) by mouth at bedtime.   morphine 30 MG 12 hr tablet Commonly known as: MS CONTIN Take 1 tablet (30 mg total) by mouth every 12 (twelve) hours. What changed:   medication strength  how much to take  when to take this  reasons to take this   multivitamin with minerals Tabs tablet Take 1 tablet by mouth daily for 15 days.   omeprazole 20 MG tablet Commonly known as: PRILOSEC OTC Take 20 mg by mouth daily.   oxyCODONE 5 MG immediate release tablet Commonly known as: Oxy IR/ROXICODONE Take 5 mg by mouth every 6 (six) hours as needed for severe pain.   polyethylene glycol 17 g packet  Commonly known as: MIRALAX / GLYCOLAX Take 17 g by mouth 2 (two) times daily.   predniSONE 10 MG tablet Commonly known as: DELTASONE Take 10 mg by mouth daily.   tamsulosin 0.4 MG Caps capsule Commonly known as: FLOMAX Take 0.4 mg by mouth daily.            Durable Medical Equipment  (From admission, onward)         Start     Ordered    04/07/20 1033  For home use only DME standard manual wheelchair with seat cushion  Once       Comments: Patient suffers from ambulatory dyspnea on exertion which impairs their ability to perform daily activities like bathing, dressing, and toileting in the home.  A walker will not resolve issue with performing activities of daily living. A wheelchair will allow patient to safely perform daily activities. Patient can safely propel the wheelchair in the home or has a caregiver who can provide assistance. Length of need 6 months . Accessories: elevating leg rests (ELRs), wheel locks, extensions and anti-tippers.   04/07/20 1032   04/07/20 1031  For home use only DME Hospital bed  Once       Question Answer Comment  Length of Need 6 Months   Head must be elevated greater than: 45 degrees   Bed type Semi-electric      04/07/20 1031   04/05/20 1618  For home use only DME 4 wheeled rolling walker with seat  Once       Question:  Patient needs a walker to treat with the following condition  Answer:  Weakness   04/05/20 1617          Follow-up [place] Follow up.   Why: rollator Contact information: [address] [place] [address] [phone]        [facility] Follow up.   Why: Registered Nurse for [facility]-office to contact wife with visit times.  Contact information: [place] [address]             Allergies  Allergen Reactions  . Penicillins     Unknown, pt was a child    Consultations:  Palliative Care   Procedures/Studies: DG Chest 2 View  Result Date: 04/04/2020 CLINICAL DATA:  Shortness of breath.  Recent fall EXAM: CHEST - 2 VIEW COMPARISON:  February 23, 2017 chest radiograph and chest CT FINDINGS: There is underlying emphysematous change. There is scarring in the left upper lobe region with apparent cavitary lesion again noted in the left upper lobe region  measuring 2.0 x 1.7 cm. Multiple nodular appearing opacities are noted bilaterally. No consolidation. Heart size is normal. Pulmonary vascularity is stable, reflecting underlying emphysematous change. No adenopathy is appreciable by radiography. No bone lesions. No pneumothorax. IMPRESSION: Underlying emphysematous change. Scarring left upper lobe with stable cavitary lesion left upper lobe. Nodular opacities bilaterally likely represent metastatic foci. No edema or consolidation. Heart size normal. No pneumothorax. Electronically Signed   By: Lowella Grip III M.D.   On: 04/04/2020 12:22   DG Shoulder Right  Result Date: 04/04/2020 CLINICAL DATA:  Pain following fall EXAM: RIGHT SHOULDER - 2+ VIEW COMPARISON:  None. FINDINGS: Frontal, oblique, and Y scapular images were obtained. No fracture or dislocation. Joint spaces appear normal. No erosive change. Visualized right lung clear. IMPRESSION: No fracture or dislocation.  No evident arthropathy. Electronically Signed   By:  Lowella Grip III M.D.   On: 04/04/2020 12:20   CT ANGIO CHEST PE W OR WO CONTRAST  Result Date: 04/05/2020 CLINICAL DATA:  Chest pain, pleurisy, effusion is suspected EXAM: CT ANGIOGRAPHY CHEST WITH CONTRAST TECHNIQUE: Multidetector CT imaging of the chest was performed using the standard protocol during bolus administration of intravenous contrast. Multiplanar CT image reconstructions and MIPs were obtained to evaluate the vascular anatomy. CONTRAST:  49 mL OMNIPAQUE IOHEXOL 350 MG/ML SOLN COMPARISON:  CT 02/24/2020 FINDINGS: Cardiovascular: Satisfactory opacification the pulmonary arteries to the segmental level. No pulmonary artery filling defects are identified. Central pulmonary arteries are normal caliber. Cardiac size within normal limits. Lobular possibly loculated pericardial effusion is similar in size to comparison when viewed on multiplanar reconstructions measuring up to 16 mm in maximal thickness towards the cardiac  apex. Few calcifications are noted in the coronary arteries. Calcification also noted on the aortic leaflets. There is some minimal mass effect upon the undersurface of the aorta secondary to left paratracheal adenopathy as noted on comparison imaging. No occlusive changes seen. No acute luminal abnormality. Lobular soft tissue attenuation along the posterior descending aorta prior to the diaphragmatic hiatus (5/150) is similar to prior and is favored to reflect lymphatic tissue rather than periaortic inflammation. Unchanged appearance from comparison prior. Suspect at least mild narrowing of the right pulmonary veins in the region hilar nodal burden. Mediastinum/Nodes: Extensive mediastinal and hilar adenopathy again seen. Left paratracheal node conglomerate measuring up to 2.7 cm in size (5/56) not significantly changed from comparison. Several right perihilar and sub hilar nodes are seen including an index right hilar node measuring up to 13 mm, not significantly changed from comparison. Additional subcarinal, prevascular nodal deposits are unchanged. Soft tissue density in the posterior mediastinum adjacent the aorta prior to the diaphragmatic hiatus (5/149) favored to be a nodal deposit, as detailed above. Maximal thickness of approximately 17 mm. No acute abnormality of the trachea or esophagus. Thyroid gland and thoracic inlet are unremarkable. Lungs/Pleura: Background of severe emphysematous changes and bronchitic features. Redemonstration of a thin walled cavitary focus in the periphery of the left upper lobe, appearing decreased in size from prior with the (largest portion measuring up to 1.9 x 2.6 cm (6/42), previously 2.2 x 2.9 cm at a similar level. Adjacent scarring and architectural distortion is seen. Innumerable bilateral pulmonary nodules are compatible with metastatic disease index nodule in the right upper lobe measures 20 mm, mildly increased from prior with this previously measured 17 mm (6/71)  increasing number and size of numerous additional nodules throughout the chest. Some atelectatic changes noted in the right lung base underlying airspace disease is difficult to fully exclude. Trace right pleural effusion. No sizable left effusion. No pneumothorax. Upper Abdomen: Postsurgical changes from prior right nephrectomy irregular heterogeneous soft tissue thickening is noted about the suture line concerning for local recurrence with some poorly delineated margins with the adjacent right adrenal gland and diaphragmatic crura (5/172). Left adrenal gland and kidney are grossly unremarkable aside from some vascular calcification. A known left liver lesion is not well visualized on this study. Musculoskeletal: Conspicuous lytic lesion is seen in the right fifth rib laterally certainly increasing in conspicuity from the comparison study fracture of the base of the right coracoid some underlying heterogeneity of the medullary bone concerning for possible pathologic fracture with extension to the glenohumeral articular surface. No other acute or conspicuous osseous lesions. Soft tissue stranding, right shoulder effusion Review of the MIP images confirms the above findings.  IMPRESSION: 1. No evidence of acute pulmonary artery filling defects. 2. Increasing size and number of the bilateral pulmonary nodules are compatible with worsening pulmonary metastatic disease. 3. Slight interval decrease in size of a thin walled cavitary focus in the periphery of the left upper lobe, previously 2.2 x 2.9 cm. 4. Extensive mediastinal and hilar adenopathy much of which is fairly similar to prior. 5. Conspicuous thickening soft tissue stranding centered upon surgical material in the right nephrectomy bed, concerning for local recurrence with possible involvement of the adjacent adrenal gland and diaphragmatic crura. 6. Lytic lesion in the right fifth rib concerning for osseous metastatic disease. Additionally there is a fracture  involving the right coracoid with extension into the inferior glenoid articular surface. Some underlying heterogeneity and lucency of the bone concerning for a pathologic fracture. Surrounding swelling and effusion. 7. Trace right pleural effusion. 8. A known left liver lesion is not well visualized on this study. 9. Aortic Atherosclerosis (ICD10-I70.0) 10. Emphysema (ICD10-J43.9). Chronic bronchitic changes. Scarring and architectural distortion throughout both lungs. These results will be called to the ordering clinician or representative by the Radiologist Assistant, and communication documented in the PACS or [employer]. Electronically Signed   By: Lovena Le M.D.   On: 04/05/2020 01:39   ECHOCARDIOGRAM COMPLETE  Result Date: 04/05/2020    ECHOCARDIOGRAM REPORT   Patient Name:   Kross Jerel Shepherd Date of Exam: 04/05/2020 Medical Rec #:  [id number]     Height:       72.0 in Accession #:    [id number]    Weight:       157.4 lb Date of Birth:  [birth year]     BSA:          1.924 m Patient Age:    65 years      BP:           137/80 mmHg Patient Gender: M             HR:           67 bpm. Exam Location:  Inpatient Procedure: 2D Echo, Intracardiac Opacification Agent, Cardiac Doppler and Color            Doppler Indications:    Pericardial effusion  History:        Patient has prior history of Echocardiogram examinations, most                 recent 03/04/2020. CHF, COPD; Risk Factors:Former Smoker.  Sonographer:    Clayton Lefort RDCS (AE) Referring Phys: [id number] Lequita Halt  Sonographer Comments: Technically challenging study due to limited acoustic windows, Technically difficult study due to poor echo windows, suboptimal parasternal window, suboptimal apical window and suboptimal subcostal window. IMPRESSIONS  1. Prominent epicardial fat layer and moderate pericardial effusion are unchanged over serial exams. The pericardial effusion is anterior to the right ventricle and surrounding the apex. There is no evidence  of cardiac tamponade.  2. Left ventricular ejection fraction, by estimation, is 55 to 60%. The left ventricle has normal function. The left ventricle has no regional wall motion abnormalities. Left ventricular diastolic parameters are indeterminate.  3. Right ventricular systolic function is mildly reduced at RV apex. The right ventricular size is mildly enlarged. Tricuspid regurgitation signal is inadequate for assessing PA pressure.  4. The mitral valve is grossly normal. No evidence of mitral valve regurgitation. No evidence of mitral stenosis.  5. The aortic valve was not well visualized. Aortic valve regurgitation is not  visualized. No aortic stenosis is present.  6. The inferior vena cava is normal in size with greater than 50% respiratory variability, suggesting right atrial pressure of 3 mmHg. FINDINGS  Left Ventricle: Left ventricular ejection fraction, by estimation, is 55 to 60%. The left ventricle has normal function. The left ventricle has no regional wall motion abnormalities. Definity contrast agent was given IV to delineate the left ventricular  endocardial borders. The left ventricular internal cavity size was normal in size. There is no left ventricular hypertrophy. Left ventricular diastolic parameters are indeterminate. Right Ventricle: The right ventricular size is mildly enlarged. No increase in right ventricular wall thickness. Right ventricular systolic function is mildly reduced. Tricuspid regurgitation signal is inadequate for assessing PA pressure. Left Atrium: Left atrial size was normal in size. Right Atrium: Right atrial size was normal in size. Pericardium: Prominent epicardial fat layer. A moderately sized pericardial effusion is present. The pericardial effusion is anterior to the right ventricle and surrounding the apex. There is no evidence of cardiac tamponade. Presence of pericardial fat pad. Mitral Valve: The mitral valve is grossly normal. No evidence of mitral valve  regurgitation. No evidence of mitral valve stenosis. Tricuspid Valve: The tricuspid valve is normal in structure. Tricuspid valve regurgitation is not demonstrated. No evidence of tricuspid stenosis. Aortic Valve: The aortic valve was not well visualized. Aortic valve regurgitation is not visualized. No aortic stenosis is present. Aortic valve mean gradient measures 3.0 mmHg. Aortic valve peak gradient measures 4.5 mmHg. Aortic valve area, by VTI measures 3.63 cm. Pulmonic Valve: The pulmonic valve was not well visualized. Pulmonic valve regurgitation is not visualized. No evidence of pulmonic stenosis. Aorta: The aortic root is normal in size and structure. Venous: The inferior vena cava is normal in size with greater than 50% respiratory variability, suggesting right atrial pressure of 3 mmHg. IAS/Shunts: No atrial level shunt detected by color flow Doppler.  LEFT VENTRICLE PLAX 2D LVIDd:         4.20 cm  Diastology LVIDs:         3.80 cm  LV e' medial:    8.27 cm/s LV PW:         1.10 cm  LV E/e' medial:  7.4 LV IVS:        1.20 cm  LV e' lateral:   5.98 cm/s LVOT diam:     2.10 cm  LV E/e' lateral: 10.2 LV SV:         60 LV SV Index:   31 LVOT Area:     3.46 cm  RIGHT VENTRICLE             IVC RV S prime:     14.90 cm/s  IVC diam: 1.20 cm TAPSE (M-mode): 2.3 cm LEFT ATRIUM             Index LA diam:        2.40 cm 1.25 cm/m LA Vol (A2C):   20.0 ml 10.39 ml/m LA Vol (A4C):   27.6 ml 14.34 ml/m LA Biplane Vol: 24.6 ml 12.78 ml/m  AORTIC VALVE AV Area (Vmax):    3.46 cm AV Area (Vmean):   3.18 cm AV Area (VTI):     3.63 cm AV Vmax:           106.00 cm/s AV Vmean:          80.700 cm/s AV VTI:            0.164 m AV Peak Grad:  4.5 mmHg AV Mean Grad:      3.0 mmHg LVOT Vmax:         106.00 cm/s LVOT Vmean:        74.200 cm/s LVOT VTI:          0.172 m LVOT/AV VTI ratio: 1.05  AORTA Ao Root diam: 3.40 cm MITRAL VALVE MV Area (PHT): 3.99 cm    SHUNTS MV Decel Time: 190 msec    Systemic VTI:  0.17 m MV E  velocity: 61.00 cm/s  Systemic Diam: 2.10 cm MV A velocity: 76.10 cm/s MV E/A ratio:  0.80 Cherlynn Kaiser MD Electronically signed by Cherlynn Kaiser MD Signature Date/Time: 04/05/2020/2:25:28 PM    Final         Subjective: Patient continue to be weak but feels more stable. He reports being ready to go home today, no nausea or vomiting.   Discharge Exam: Vitals:   04/07/20 1957 04/08/20 0427  BP: 120/72 113/70  Pulse: 86 95  Resp:    Temp: 98 F (36.7 C)   SpO2: 93% 93%   Vitals:   04/07/20 1933 04/07/20 1957 04/08/20 0400 04/08/20 0427  BP:  120/72  113/70  Pulse:  86  95  Resp:      Temp:  98 F (36.7 C)    TempSrc:  Oral    SpO2: 95% 93%  93%  Weight:   73.4 kg   Height:        General: deconditioned Neurology: Awake and alert, non focal  E ENT: mild pallor, no icterus, oral mucosa moist Cardiovascular: No JVD. S1-S2 present, rhythmic, no gallops, rubs, or murmurs. No lower extremity edema. Pulmonary: positive  breath sounds bilaterally,with no wheezing, rhonchi or rales. Gastrointestinal. Abdomen soft and non tender Skin. No rashes Musculoskeletal: no joint deformities   The results of significant diagnostics from this hospitalization (including imaging, microbiology, ancillary and laboratory) are listed below for reference.     Microbiology: Recent Results (from the past 240 hour(s))  Resp Panel by RT-PCR (Flu A&B, Covid) Nasopharyngeal Swab     Status: None   Collection Time: 04/04/20  2:34 PM   Specimen: Nasopharyngeal Swab; Nasopharyngeal(NP) swabs in vial transport medium  Result Value Ref Range Status   SARS Coronavirus 2 by RT PCR NEGATIVE NEGATIVE Final    Comment: (NOTE) SARS-CoV-2 target nucleic acids are NOT DETECTED.  The SARS-CoV-2 RNA is generally detectable in upper respiratory specimens during the acute phase of infection. The lowest concentration of SARS-CoV-2 viral copies this assay can detect is 138 copies/mL. A negative result does  not preclude SARS-Cov-2 infection and should not be used as the sole basis for treatment or other patient management decisions. A negative result may occur with  improper specimen collection/handling, submission of specimen other than nasopharyngeal swab, presence of viral mutation(s) within the areas targeted by this assay, and inadequate number of viral copies(<138 copies/mL). A negative result must be combined with clinical observations, patient history, and epidemiological information. The expected result is Negative.  Fact Sheet for Patients:  EntrepreneurPulse.com.au  Fact Sheet for Healthcare Providers:  IncredibleEmployment.be  This test is no t yet approved or cleared by the [place] FDA and  has been authorized for detection and/or diagnosis of SARS-CoV-2 by FDA under an Emergency Use Authorization (EUA). This EUA will remain  in effect (meaning this test can be used) for the duration of the COVID-19 declaration under Section 564(b)(1) of the Act, 21 U.S.C.section 360bbb-3(b)(1), unless the authorization is terminated  or revoked sooner.       Influenza A by PCR NEGATIVE NEGATIVE Final   Influenza B by PCR NEGATIVE NEGATIVE Final    Comment: (NOTE) The Xpert Xpress SARS-CoV-2/FLU/RSV plus assay is intended as an aid in the diagnosis of influenza from Nasopharyngeal swab specimens and should not be used as a sole basis for treatment. Nasal washings and aspirates are unacceptable for Xpert Xpress SARS-CoV-2/FLU/RSV testing.  Fact Sheet for Patients: EntrepreneurPulse.com.au  Fact Sheet for Healthcare Providers: IncredibleEmployment.be  This test is not yet approved or cleared by the [place] FDA and has been authorized for detection and/or diagnosis of SARS-CoV-2 by FDA under an Emergency Use Authorization (EUA). This EUA will remain in effect (meaning this test can be used) for the  duration of the COVID-19 declaration under Section 564(b)(1) of the Act, 21 U.S.C. section 360bbb-3(b)(1), unless the authorization is terminated or revoked.  Performed at [facility], [address] [place], [place] [address]      Labs: BNP (last 3 results) Recent Labs    04/04/20 1100  BNP 063.0*   Basic Metabolic Panel: Recent Labs  Lab 04/04/20 1100 04/04/20 1922 04/05/20 0311 04/06/20 0431 04/07/20 0527 04/08/20 0406  NA 124*  --  124* 128* 132* 132*  K 4.9  --  4.7 4.2 4.4 4.3  CL 87*  --  87* 93* 97* 95*  CO2 24  --  25 25 27 28   GLUCOSE 144*  --  96 130* 109* 111*  BUN 32*  --  29* 23 15 14   CREATININE 1.38* 1.57* 1.71* 1.46* 1.23 1.21  CALCIUM 9.0  --  9.3 9.0 9.0 9.2   Liver Function Tests: No results for input(s): AST, ALT, ALKPHOS, BILITOT, PROT, ALBUMIN in the last 168 hours. No results for input(s): LIPASE, AMYLASE in the last 168 hours. No results for input(s): AMMONIA in the last 168 hours. CBC: Recent Labs  Lab 04/04/20 1100 04/04/20 1922 04/06/20 0431  WBC 12.8* 12.5* 10.8*  NEUTROABS 10.9*  --  8.9*  HGB 11.3* 11.1* 9.7*  HCT 33.1* 32.6* 28.8*  MCV 82.1 82.3 83.0  PLT 253 245 211   Cardiac Enzymes: No results for input(s): CKTOTAL, CKMB, CKMBINDEX, TROPONINI in the last 168 hours. BNP: Invalid input(s): POCBNP CBG: No results for input(s): GLUCAP in the last 168 hours. D-Dimer No results for input(s): DDIMER in the last 72 hours. Hgb A1c No results for input(s): HGBA1C in the last 72 hours. Lipid Profile No results for input(s): CHOL, HDL, LDLCALC, TRIG, CHOLHDL, LDLDIRECT in the last 72 hours. Thyroid function studies No results for input(s): TSH, T4TOTAL, T3FREE, THYROIDAB in the last 72 hours.  Invalid input(s): FREET3 Anemia work up No results for input(s): VITAMINB12, FOLATE, FERRITIN, TIBC, IRON, RETICCTPCT in the last 72 hours. Urinalysis No results found for: COLORURINE, APPEARANCEUR, [place], [place],  GLUCOSEU, [place], [place], [place], PROTEINUR, UROBILINOGEN, NITRITE, LEUKOCYTESUR Sepsis Labs Invalid input(s): PROCALCITONIN,  WBC,  LACTICIDVEN Microbiology Recent Results (from the past 240 hour(s))  Resp Panel by RT-PCR (Flu A&B, Covid) Nasopharyngeal Swab     Status: None   Collection Time: 04/04/20  2:34 PM   Specimen: Nasopharyngeal Swab; Nasopharyngeal(NP) swabs in vial transport medium  Result Value Ref Range Status   SARS Coronavirus 2 by RT PCR NEGATIVE NEGATIVE Final    Comment: (NOTE) SARS-CoV-2 target nucleic acids are NOT DETECTED.  The SARS-CoV-2 RNA is generally detectable in upper respiratory specimens during the acute phase of infection. The lowest  concentration of SARS-CoV-2 viral copies this assay can detect is 138 copies/mL. A negative result does not preclude SARS-Cov-2 infection and should not be used as the sole basis for treatment or other patient management decisions. A negative result may occur with  improper specimen collection/handling, submission of specimen other than nasopharyngeal swab, presence of viral mutation(s) within the areas targeted by this assay, and inadequate number of viral copies(<138 copies/mL). A negative result must be combined with clinical observations, patient history, and epidemiological information. The expected result is Negative.  Fact Sheet for Patients:  EntrepreneurPulse.com.au  Fact Sheet for Healthcare Providers:  IncredibleEmployment.be  This test is no t yet approved or cleared by the [place] FDA and  has been authorized for detection and/or diagnosis of SARS-CoV-2 by FDA under an Emergency Use Authorization (EUA). This EUA will remain  in effect (meaning this test can be used) for the duration of the COVID-19 declaration under Section 564(b)(1) of the Act, 21 U.S.C.section 360bbb-3(b)(1), unless the authorization is terminated  or revoked sooner.       Influenza A  by PCR NEGATIVE NEGATIVE Final   Influenza B by PCR NEGATIVE NEGATIVE Final    Comment: (NOTE) The Xpert Xpress SARS-CoV-2/FLU/RSV plus assay is intended as an aid in the diagnosis of influenza from Nasopharyngeal swab specimens and should not be used as a sole basis for treatment. Nasal washings and aspirates are unacceptable for Xpert Xpress SARS-CoV-2/FLU/RSV testing.  Fact Sheet for Patients: EntrepreneurPulse.com.au  Fact Sheet for Healthcare Providers: IncredibleEmployment.be  This test is not yet approved or cleared by the [place] FDA and has been authorized for detection and/or diagnosis of SARS-CoV-2 by FDA under an Emergency Use Authorization (EUA). This EUA will remain in effect (meaning this test can be used) for the duration of the COVID-19 declaration under Section 564(b)(1) of the Act, 21 U.S.C. section 360bbb-3(b)(1), unless the authorization is terminated or revoked.  Performed at [facility], [address], [place], [place] [address]      Time coordinating discharge: 45 minutes  SIGNED:   Tawni Millers, MD  [facility] 04/08/2020, 10:08 AM

## 2020-04-08 NOTE — Care Management Important Message (Signed)
Important Message  Patient Details  Name: Corey Dixon MRN: [id number] Date of Birth: Sep 29, 1955   Medicare Important Message Given:  Yes     Shelda Altes 04/08/2020, 9:17 AM

## 2020-04-08 NOTE — Progress Notes (Signed)
D/C instructions given and reviewed. Awaiting sister to arrive to transport home.

## 2020-04-08 NOTE — TOC Transition Note (Addendum)
Transition of Care [facility]) - CM/SW Discharge Note   Patient Details  Name: Corey Dixon MRN: [id number] Date of Birth: [birth year]  Transition of Care [facility]) CM/SW Contact:  Zenon Mayo, RN Phone Number: 04/08/2020, 12:02 PM   Clinical Narrative:    Patient is for dc today, NCM spoke with wife, she is waiting for DME to be delivered to the home, she will  Call this NCM to let know when DME is there.  She states patient sister who lives close by the hospital will be transporting patient home today. NCM spoke with wife, she states she is going to call Medicare to see if they will cover ambulance transport and she will call this NCM back.   Final next level of care: Home w Hospice Care Barriers to Discharge: Equipment Delay   Patient Goals and CMS Choice Patient states their goals for this hospitalization and ongoing recovery are:: home with hospice CMS Medicare.gov Compare Post Acute Care list provided to:: Patient Represenative (must comment) Choice offered to / list presented to : Spouse  Discharge Placement                       Discharge Plan and Services In-house Referral: Hospice / Palliative Care Discharge Planning Services: CM Consult Post Acute Care Choice: Hospice          DME Arranged: N/A DME Agency: NA       HH Arranged: RN [place] Agency: [place] Date [place]: 04/07/20 Time [place]: 32 Representative spoke with at [place]: [place] ([place]) Interventions     Readmission Risk Interventions No flowsheet data found.

## 2020-04-08 NOTE — TOC Progression Note (Signed)
Transition of Care [facility]) - Progression Note    Patient Details  Name: ALYJAH LOVINGOOD MRN: [id number] Date of Birth: 07/26/1955  Transition of Care [facility]) CM/SW Contact  Zenon Mayo, RN Phone Number: 04/08/2020, 11:58 AM  Clinical Narrative:    Patient is for dc today, NCM spoke with wife, she is waiting for DME to be delivered to the home, she will  Call this NCM to let know when DME is there.  She states patient sister who lives close by the hospital will be transporting patient home today.     Expected Discharge Plan: Home w Hospice Care Barriers to Discharge: No Barriers Identified  Expected Discharge Plan and Services Expected Discharge Plan: [place] In-house Referral: Hospice / Palliative Care Discharge Planning Services: CM Consult Post Acute Care Choice: Hospice Living arrangements for the past 2 months: Single Family Home Expected Discharge Date: 04/08/20               DME Arranged: N/A DME Agency: NA (Hospice to arrange for WC, HB and 02.)       HH Arranged: RN [place] Date [place]: 04/07/20 Time [place]: 78 Representative spoke with at [place]: [place] ([place]) Interventions    Readmission Risk Interventions No flowsheet data found.

## 2020-04-08 NOTE — TOC Transition Note (Signed)
Transition of Care [facility]) - CM/SW Discharge Note   Patient Details  Name: Corey Dixon MRN: [id number] Date of Birth: 01/18/1956  Transition of Care [facility]) CM/SW Contact:  Vinie Sill, [place] Phone Number: 04/08/2020, 3:43 PM   Clinical Narrative:     Patient will Discharge to: Home  Transport TD:[facility] Ambulance transport requested for patient.   Clinical Social Worker signing off.  Thurmond Butts, MSW, LCSW Clinical Social Worker   Final next level of care: Home w Hospice Care Barriers to Discharge: Equipment Delay   Patient Goals and CMS Choice Patient states their goals for this hospitalization and ongoing recovery are:: home with hospice CMS Medicare.gov Compare Post Acute Care list provided to:: Patient Represenative (must comment) Choice offered to / list presented to : Spouse  Discharge Placement                       Discharge Plan and Services In-house Referral: Hospice / Palliative Care Discharge Planning Services: CM Consult Post Acute Care Choice: Hospice          DME Arranged: N/A DME Agency: NA       HH Arranged: RN [place] Agency: [place] Date [place]: 04/07/20 Time [place]: 92 Representative spoke with at [place]: [place] ([place]) Interventions     Readmission Risk Interventions No flowsheet data found.

## 2020-04-08 NOTE — TOC Progression Note (Signed)
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 CSW offered substance use information pt took the information and stated that he is homeless but is allowed to temporarily stay at a friends house on the floor. CSW will follow up with pt before DC.   Expected Discharge Plan: Home w Hospice Care Barriers to Discharge: Equipment Delay  Expected Discharge Plan and Services Expected Discharge Plan: [place] In-house Referral: Hospice / Palliative Care Discharge Planning Services: CM Consult Post Acute Care Choice: Hospice Living arrangements for the past 2 months: Single Family Home Expected Discharge Date: 04/08/20               DME Arranged: N/A DME Agency: NA       HH Arranged: RN [place] Agency: [place] Date [place]: 04/07/20 Time [place]: 19 Representative spoke with at [place]: [place] ([place]) Interventions    Readmission Risk Interventions No flowsheet data found.

## 2020-04-08 NOTE — TOC Progression Note (Signed)
Transition of Care [facility]) - Progression Note    Patient Details  Name: [name] MRN: [id number] Date of Birth: [birth year]  Transition of Care [facility]) CM/SW Contact  Zenon Mayo, RN Phone Number: 04/08/2020, 11:03 AM  Clinical Narrative:    Patient will need to have DME delivered to home before he is discharged.   Expected Discharge Plan: Home w Hospice Care Barriers to Discharge: No Barriers Identified  Expected Discharge Plan and Services Expected Discharge Plan: [place] In-house Referral: Hospice / Palliative Care Discharge Planning Services: CM Consult Post Acute Care Choice: Hospice Living arrangements for the past 2 months: Single Family Home Expected Discharge Date: 04/08/20               DME Arranged: N/A DME Agency: NA (Hospice to arrange for WC, HB and 02.)       HH Arranged: RN [place] Date [place]: 04/07/20 Time [place]: 45 Representative spoke with at [place]: [place] ([place]) Interventions    Readmission Risk Interventions No flowsheet data found.

## 2020-04-08 NOTE — Plan of Care (Signed)
  Problem: Education: Goal: Knowledge of General Education information will improve Description: Including pain rating scale, medication(s)/side effects and non-pharmacologic comfort measures Outcome: Adequate for Discharge   

## 2020-04-15 ENCOUNTER — Telehealth: Payer: Self-pay | Admitting: Pulmonary Disease

## 2020-04-15 NOTE — Telephone Encounter (Signed)
I gave the lady what I could give her and then transferred her to billing since that is what this is about

## 2020-04-15 NOTE — Telephone Encounter (Signed)
Flexible bronchoscopy (57846) Brushing (96295) of the LUL Bronchial alveolar lavage (28413) of the LUL  Endobronchial ultrasound (24401) Transbronchial needle aspiration (02725) of the 4R LN  Transbronchial needle aspiration, additional lobe (36644) of the 4L

## 2020-04-15 NOTE — Telephone Encounter (Signed)
This was a inpatient charge we would not have had it to check on

## 2020-04-26 DEATH — deceased

## 2020-06-05 LAB — ACID FAST CULTURE WITH REFLEXED SENSITIVITIES (MYCOBACTERIA): Acid Fast Culture: NEGATIVE

## 2021-11-03 IMAGING — DX DG CHEST 2V
2 series · 2 of 2 positions shown · non-contrast
Comparison: February 23, 2017 chest radiograph and chest CT

CLINICAL DATA: Shortness of breath.  Recent fall

EXAM:
CHEST - 2 VIEW

[chest ap strecther]
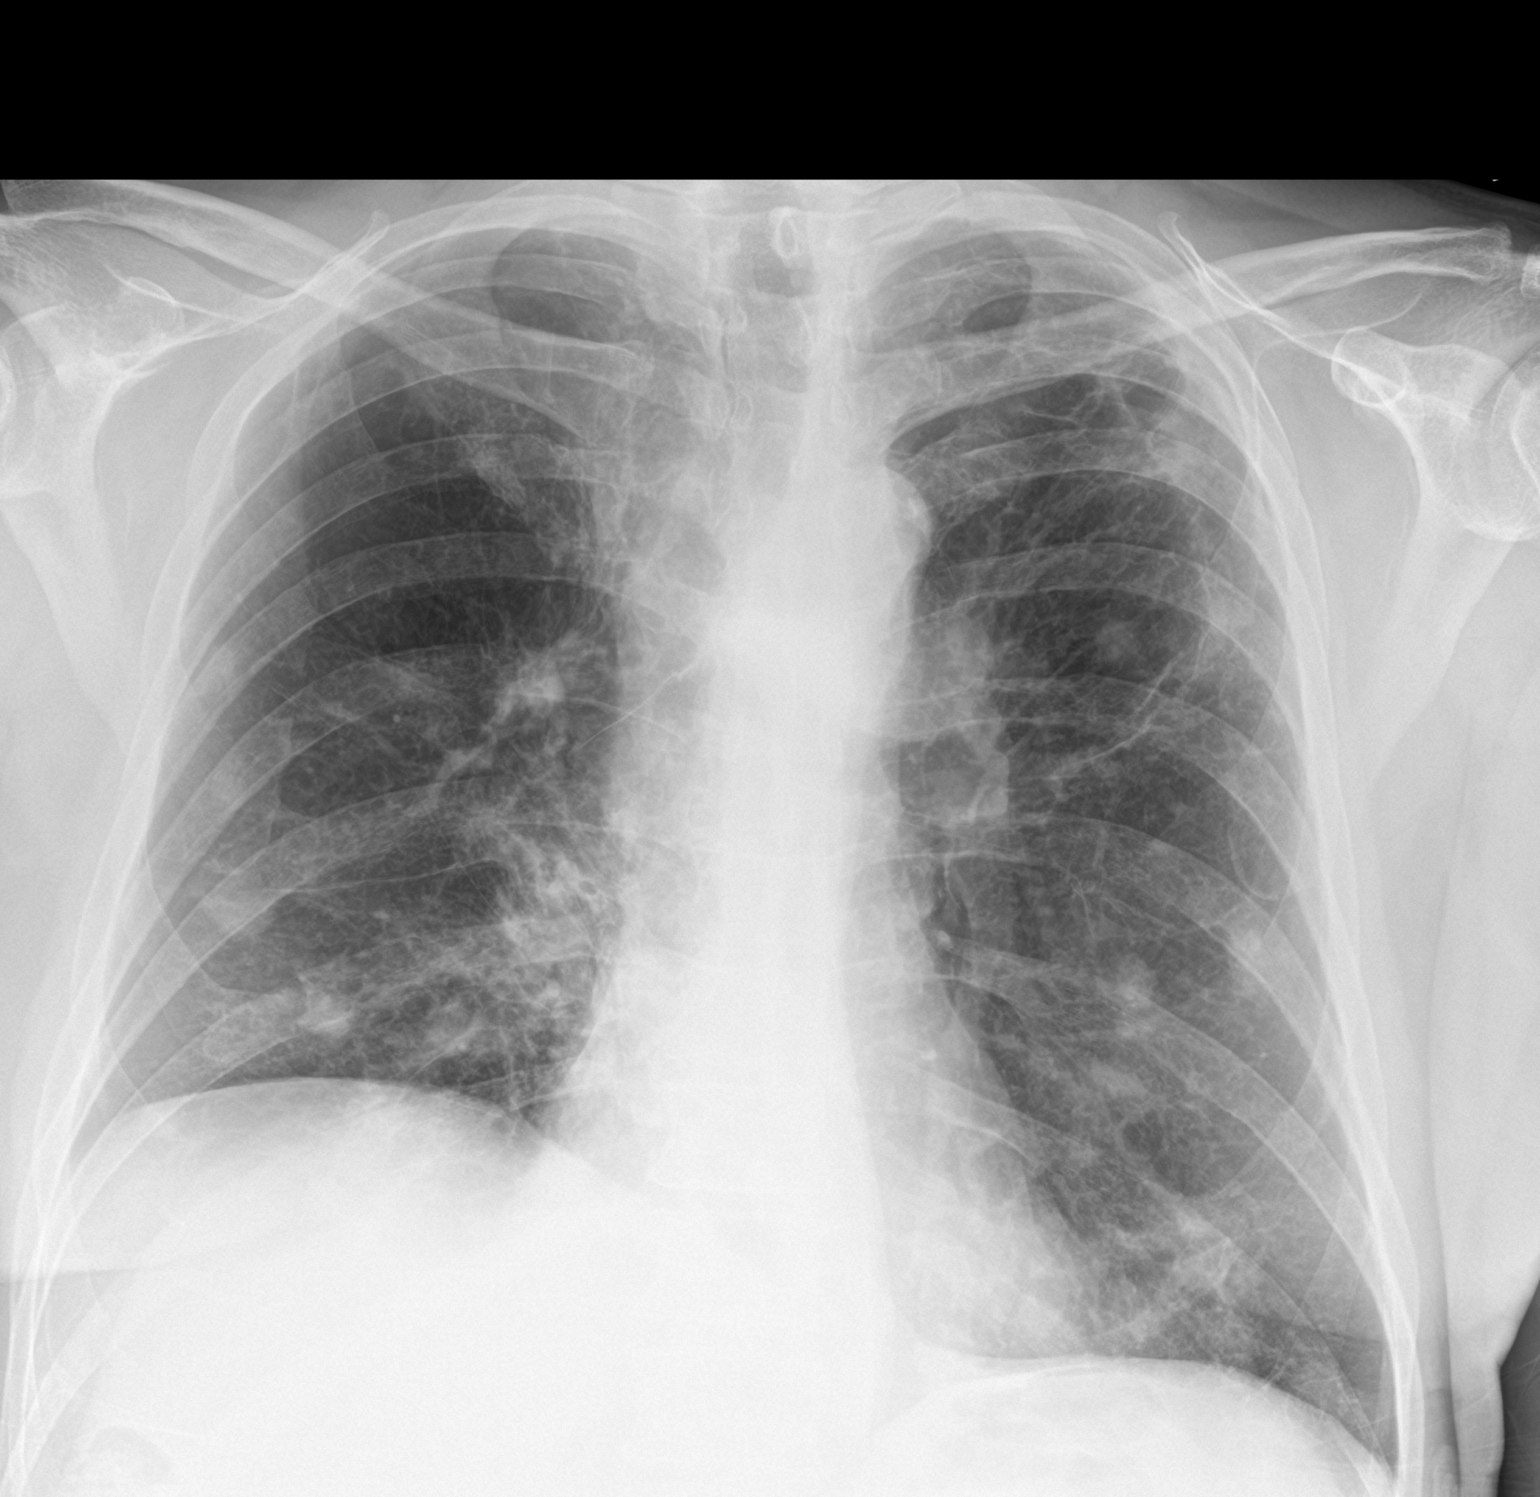

[chest lat]
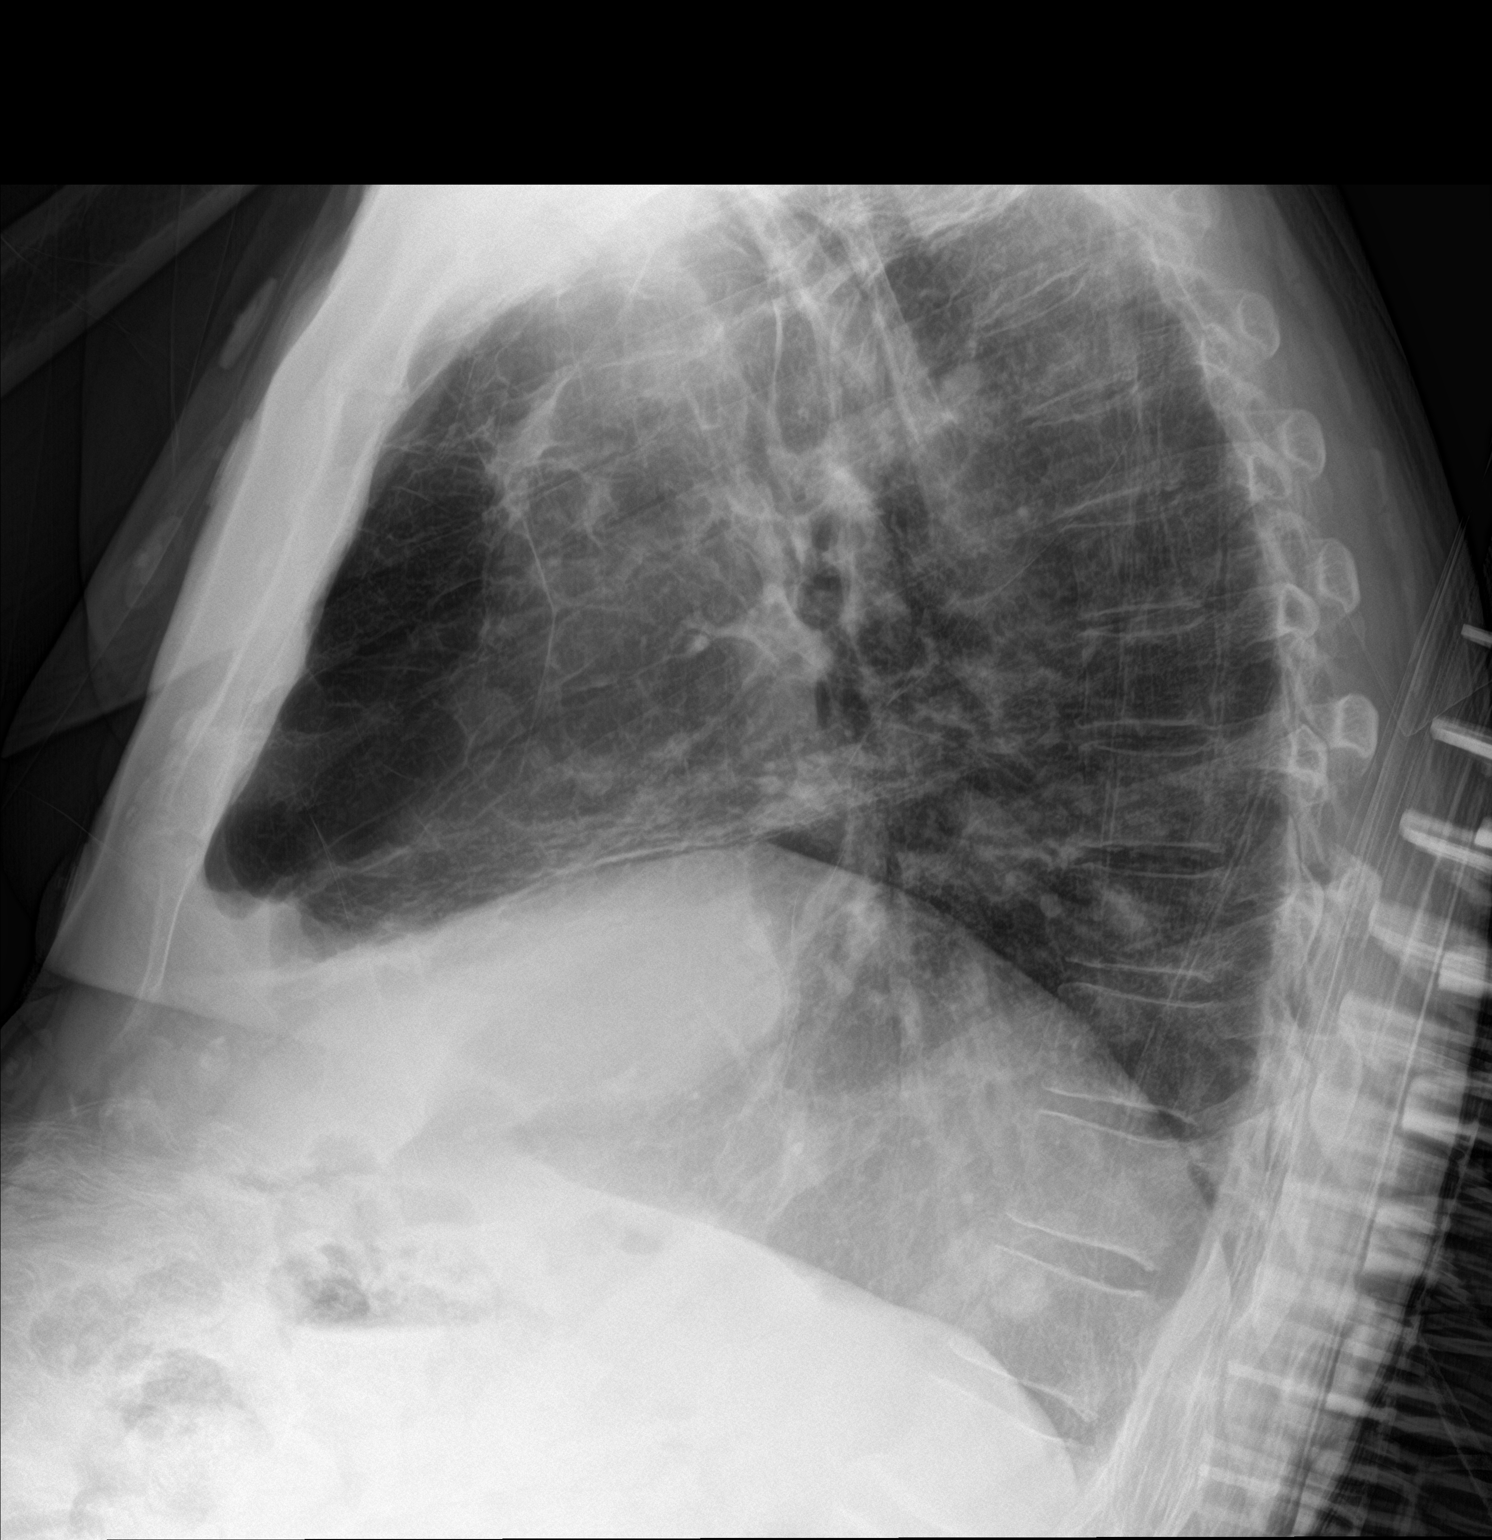

[2 of 2 positions shown; findings below may reference images not displayed]

FINDINGS: There is underlying emphysematous change. There is scarring in the
left upper lobe region with apparent cavitary lesion again noted in
the left upper lobe region measuring 2.0 x 1.7 cm. Multiple nodular
appearing opacities are noted bilaterally. No consolidation. Heart
size is normal. Pulmonary vascularity is stable, reflecting
underlying emphysematous change. No adenopathy is appreciable by
radiography. No bone lesions. No pneumothorax.
IMPRESSION: Underlying emphysematous change. Scarring left upper lobe with
stable cavitary lesion left upper lobe. Nodular opacities
bilaterally likely represent metastatic foci. No edema or
consolidation. Heart size normal. No pneumothorax.

## 2021-11-03 IMAGING — DX DG SHOULDER 2+V*R*
3 series · 3 of 3 positions shown · non-contrast
Comparison: None.

CLINICAL DATA: Pain following fall

EXAM:
RIGHT SHOULDER - 2+ VIEW

[shoulder grashey]
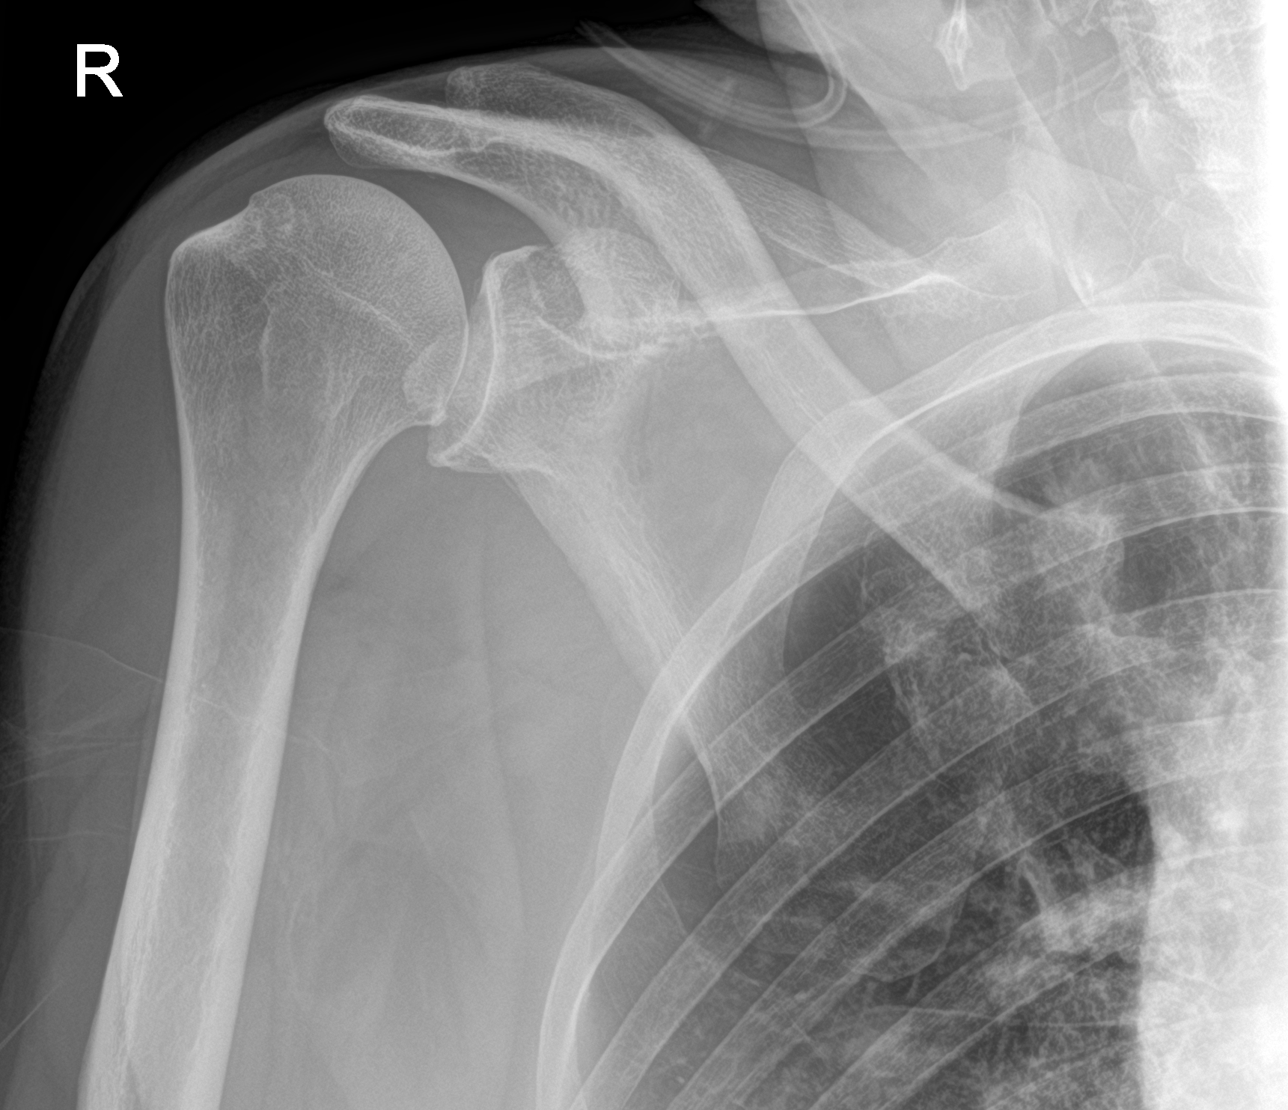

[shoulder ap neutral]
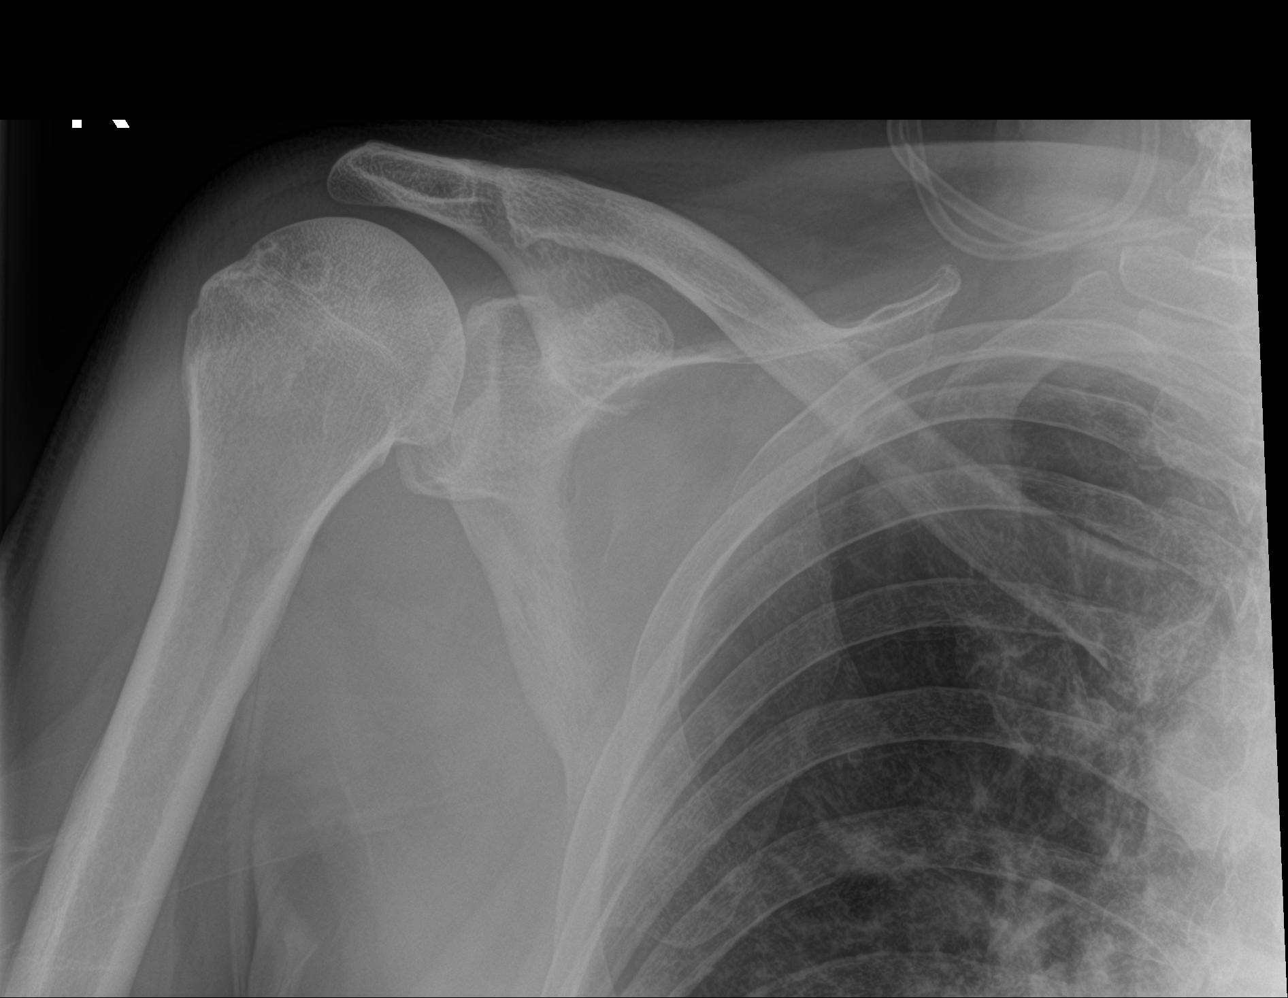

[shoulder y view]
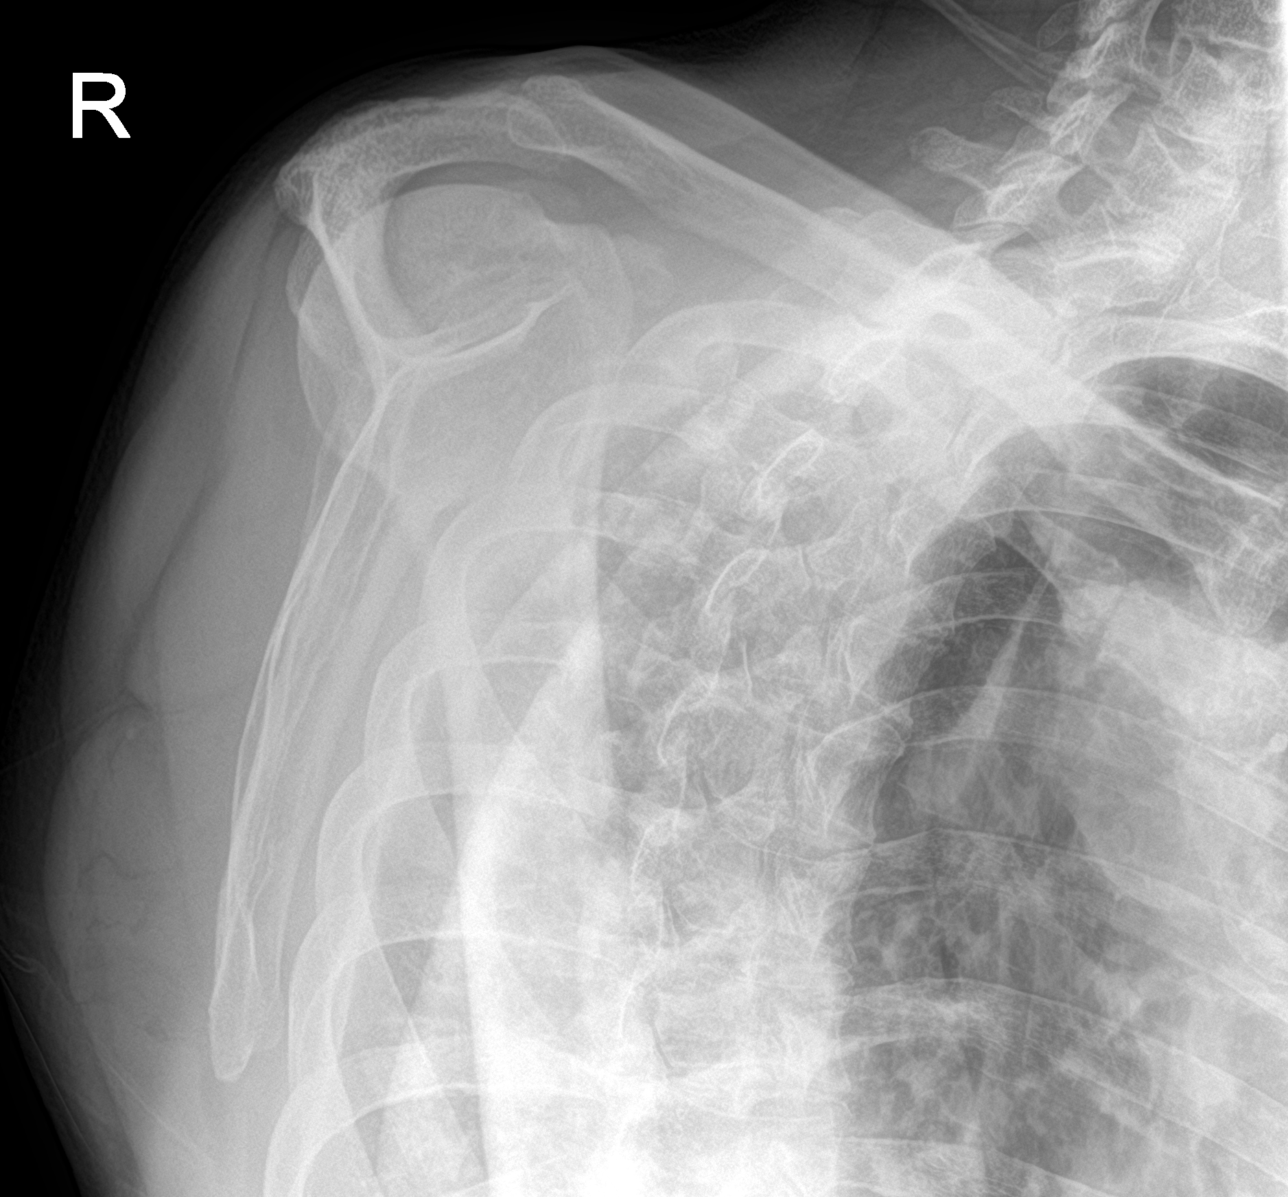

[3 of 3 positions shown; findings below may reference images not displayed]

FINDINGS: Frontal, oblique, and Y scapular images were obtained. No fracture
or dislocation. Joint spaces appear normal. No erosive change.
Visualized right lung clear.
IMPRESSION: No fracture or dislocation.  No evident arthropathy.
